# Patient Record
Sex: Female | Born: 1990 | Race: Black or African American | Hispanic: No | Marital: Single | Smoking: Current every day smoker
Health system: Southern US, Community
[De-identification: ages and names within clinical notes are randomized; demographics above are authoritative.]

## PROBLEM LIST (undated history)

## (undated) ENCOUNTER — Inpatient Hospital Stay (HOSPITAL_COMMUNITY): Payer: Self-pay

## (undated) DIAGNOSIS — D649 Anemia, unspecified: Secondary | ICD-10-CM

## (undated) DIAGNOSIS — Z7689 Persons encountering health services in other specified circumstances: Secondary | ICD-10-CM

## (undated) DIAGNOSIS — K219 Gastro-esophageal reflux disease without esophagitis: Secondary | ICD-10-CM

## (undated) DIAGNOSIS — F419 Anxiety disorder, unspecified: Secondary | ICD-10-CM

## (undated) DIAGNOSIS — Z5189 Encounter for other specified aftercare: Secondary | ICD-10-CM

## (undated) HISTORY — PX: EYE SURGERY: SHX253

## (undated) HISTORY — DX: Encounter for other specified aftercare: Z51.89

## (undated) HISTORY — PX: FINGER SURGERY: SHX640

## (undated) HISTORY — DX: Gastro-esophageal reflux disease without esophagitis: K21.9

---
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
## 2012-01-19 ENCOUNTER — Encounter (HOSPITAL_COMMUNITY): Payer: Self-pay

## 2012-01-19 ENCOUNTER — Emergency Department (HOSPITAL_COMMUNITY)
Admission: EM | Admit: 2012-01-19 | Discharge: 2012-01-19 | Disposition: A | Discharge status: Home / Self Care | Payer: Self-pay | Attending: Emergency Medicine | Admitting: Emergency Medicine

## 2012-01-19 DIAGNOSIS — J029 Acute pharyngitis, unspecified: Secondary | ICD-10-CM | POA: Insufficient documentation

## 2012-01-19 LAB — RAPID STREP SCREEN (MED CTR MEBANE ONLY): Streptococcus, Group A Screen (Direct): NEGATIVE

## 2012-01-19 NOTE — ED Notes (Signed)
Pt complainso f sore throat for 2 days, sts it may be her allergies.

## 2012-01-19 NOTE — ED Provider Notes (Signed)
History  This chart was scribed for Hilario Quarry, MD by Ladona Ridgel Day. This patient was seen in room TR02C/TR02C and the patient's care was started at 1428.   CSN: [id number]  Arrival date & time 01/19/12  1428   First MD Initiated Contact with Patient 01/19/12 1526      Chief Complaint  Patient presents with  . Sore Throat   Patient is a 21 y.o. female presenting with pharyngitis. The history is provided by the patient. No language interpreter was used.  Sore Throat This is a new problem. The current episode started 2 days ago. The problem occurs constantly. The problem has been gradually worsening. Pertinent negatives include no chest pain, no abdominal pain and no shortness of breath. Nothing aggravates the symptoms. Nothing relieves the symptoms. She has tried acetaminophen for the symptoms. The treatment provided no relief.   Katie Powell is a 21 y.o. female who presents to the Emergency Department complaining of constant gradually worsening sore throat since yesterday. She has been taking tylenol without any relief from her symptoms. She denies any fever, runny nose, or cough. She denies any sick contacts at home. She denies any medical history.   No past medical history on file.  No past surgical history on file.  No family history on file.  History  Substance Use Topics  . Smoking status: Never Smoker   . Smokeless tobacco: Not on file  . Alcohol Use: No    OB History    Grav Para Term Preterm Abortions TAB SAB Ect Mult Living                  Review of Systems  Constitutional: Negative for fever and chills.  HENT: Positive for sore throat. Negative for congestion and rhinorrhea.   Respiratory: Negative for shortness of breath.   Cardiovascular: Negative for chest pain.  Gastrointestinal: Negative for nausea, vomiting and abdominal pain.  Neurological: Negative for weakness.  All other systems reviewed and are negative.    Allergies  Review of patient's  allergies indicates no known allergies.  Home Medications   Current Outpatient Rx  Name Route Sig Dispense Refill  . ACETAMINOPHEN 500 MG PO TABS Oral Take 1,000 mg by mouth every 6 (six) hours as needed. For pain      Triage vitals: BP 113/66  Pulse 80  Temp 99.1 F (37.3 C) (Oral)  Resp 18  SpO2 100%  LMP 12/23/2011  Physical Exam  Nursing note and vitals reviewed. Constitutional: She is oriented to person, place, and time. She appears well-developed and well-nourished. No distress.  HENT:  Head: Normocephalic and atraumatic.       Mild pharyngeal erythema.   Eyes: EOM are normal.  Neck: Neck supple. No tracheal deviation present.  Cardiovascular: Normal rate.   Pulmonary/Chest: Effort normal. No respiratory distress.  Musculoskeletal: Normal range of motion.  Lymphadenopathy:    She has no cervical adenopathy.  Neurological: She is alert and oriented to person, place, and time.  Skin: Skin is warm and dry.  Psychiatric: She has a normal mood and affect. Her behavior is normal.    ED Course  Procedures (including critical care time) DIAGNOSTIC STUDIES: Oxygen Saturation is 100% on room air, normal by my interpretation.    COORDINATION OF CARE: At 330 PM Discussed treatment plan with patient which includes ibuprofen, proper hydration and return to the ED if her symptoms worsen or change. Patient agrees.    Labs Reviewed  RAPID STREP SCREEN  No results found. Results for orders placed during the hospital encounter of 01/19/12  RAPID STREP SCREEN      Component Value Range   Streptococcus, Group A Screen (Direct) NEGATIVE  NEGATIVE     1. Pharyngitis       MDM  I personally performed the services described in this documentation, which was scribed in my presence. The recorded information has been reviewed and considered.         Hilario Quarry, MD 01/19/12 [phone]

## 2012-09-07 ENCOUNTER — Encounter (HOSPITAL_COMMUNITY): Payer: Self-pay | Admitting: *Deleted

## 2012-09-07 ENCOUNTER — Emergency Department (HOSPITAL_COMMUNITY)
Admission: EM | Admit: 2012-09-07 | Discharge: 2012-09-08 | Disposition: A | Discharge status: Home / Self Care | Payer: Self-pay | Attending: Emergency Medicine | Admitting: Emergency Medicine

## 2012-09-07 DIAGNOSIS — R110 Nausea: Secondary | ICD-10-CM | POA: Insufficient documentation

## 2012-09-07 DIAGNOSIS — Z3202 Encounter for pregnancy test, result negative: Secondary | ICD-10-CM | POA: Insufficient documentation

## 2012-09-07 DIAGNOSIS — R109 Unspecified abdominal pain: Secondary | ICD-10-CM | POA: Insufficient documentation

## 2012-09-07 DIAGNOSIS — D649 Anemia, unspecified: Secondary | ICD-10-CM | POA: Insufficient documentation

## 2012-09-07 DIAGNOSIS — R197 Diarrhea, unspecified: Secondary | ICD-10-CM | POA: Insufficient documentation

## 2012-09-07 LAB — CBC WITH DIFFERENTIAL/PLATELET
Basophils Absolute: 0 10*3/uL (ref 0.0–0.1)
Eosinophils Relative: 4 % (ref 0–5)
Lymphocytes Relative: 44 % (ref 12–46)
Lymphs Abs: 2.1 10*3/uL (ref 0.7–4.0)
MCV: 60.4 fL — ABNORMAL LOW (ref 78.0–100.0)
Monocytes Relative: 7 % (ref 3–12)
Neutrophils Relative %: 44 % (ref 43–77)
Platelets: 245 10*3/uL (ref 150–400)
RBC: 4.12 MIL/uL (ref 3.87–5.11)
RDW: 19.5 % — ABNORMAL HIGH (ref 11.5–15.5)
WBC: 4.8 10*3/uL (ref 4.0–10.5)

## 2012-09-07 LAB — URINALYSIS, MICROSCOPIC ONLY
Bilirubin Urine: NEGATIVE
Glucose, UA: NEGATIVE mg/dL
Hgb urine dipstick: NEGATIVE
Protein, ur: NEGATIVE mg/dL
Urobilinogen, UA: 0.2 mg/dL (ref 0.0–1.0)

## 2012-09-07 LAB — COMPREHENSIVE METABOLIC PANEL
ALT: 8 U/L (ref 0–35)
AST: 20 U/L (ref 0–37)
Albumin: 3.8 g/dL (ref 3.5–5.2)
Alkaline Phosphatase: 56 U/L (ref 39–117)
CO2: 24 mEq/L (ref 19–32)
Chloride: 104 mEq/L (ref 96–112)
Creatinine, Ser: 0.86 mg/dL (ref 0.50–1.10)
GFR calc non Af Amer: >90 mL/min (ref >90)
Potassium: 3.3 mEq/L — ABNORMAL LOW (ref 3.5–5.1)
Sodium: 137 mEq/L (ref 135–145)
Total Bilirubin: 0.6 mg/dL (ref 0.3–1.2)

## 2012-09-07 LAB — POCT PREGNANCY, URINE: Preg Test, Ur: NEGATIVE

## 2012-09-07 MED ORDER — ONDANSETRON 4 MG PO TBDP
8.0000 mg | ORAL_TABLET | Freq: Once | ORAL | Status: AC
  Administered 2012-09-08: 8 mg via ORAL
  Filled 2012-09-07: qty 2

## 2012-09-07 NOTE — ED Notes (Signed)
Pt c/o nausea & diarrhea x 1 week, denies vomiting. Also c/o lower abdominal cramping

## 2012-09-07 NOTE — ED Notes (Signed)
MD at bedside. Otter 

## 2012-09-08 MED ORDER — ONDANSETRON 8 MG PO TBDP: 8.0000 mg | ORAL_TABLET | Freq: Three times a day (TID) | ORAL | Status: DC | PRN

## 2012-09-08 MED ORDER — FERROUS SULFATE 325 (65 FE) MG PO TABS: 325.0000 mg | ORAL_TABLET | Freq: Every day | ORAL | Status: DC

## 2012-09-08 NOTE — ED Notes (Signed)
Pt states he cannot poop to obtain stool sample.

## 2012-09-08 NOTE — ED Provider Notes (Addendum)
History     CSN: [id number]  Arrival date & time 09/07/12  1919   First MD Initiated Contact with Patient 09/07/12 2340      Chief Complaint  Patient presents with  . Diarrhea    (Consider location/radiation/quality/duration/timing/severity/associated sxs/prior treatment) HPI 22 yo female presents with complaint of nausea, mild mid abd pain, and loose stools x 1 week.  No fevers, no travel, no unusual foods, no sick contacts.  No vomiting.  Pt noted to be anemic.  No prior h/o same.  She denies cp, sob, DOE, fatigue.  She reports heavy periods.     History reviewed. No pertinent past medical history.  Past Surgical History  Procedure Laterality Date  . Finger surgery      History reviewed. No pertinent family history.  History  Substance Use Topics  . Smoking status: Never Smoker   . Smokeless tobacco: Not on file  . Alcohol Use: No    OB History   Grav Para Term Preterm Abortions TAB SAB Ect Mult Living                  Review of Systems  All other systems reviewed and are negative.    Allergies  Review of patient's allergies indicates no known allergies.  Home Medications   Current Outpatient Rx  Name  Route  Sig  Dispense  Refill  . OVER THE COUNTER MEDICATION   Oral   Take 2 tablets by mouth every 6 (six) hours as needed (pain).         . ferrous sulfate 325 (65 FE) MG tablet   Oral   Take 1 tablet (325 mg total) by mouth daily.   30 tablet   0   . ondansetron (ZOFRAN-ODT) 8 MG disintegrating tablet   Oral   Take 1 tablet (8 mg total) by mouth every 8 (eight) hours as needed for nausea.   20 tablet   0     BP 116/70  Pulse 51  Temp(Src) 97.6 F (36.4 C) (Oral)  Resp 17  SpO2 100%  LMP 08/01/2012  Physical Exam  Nursing note and vitals reviewed. Constitutional: She is oriented to person, place, and time. She appears well-developed and well-nourished.  HENT:  Head: Normocephalic and atraumatic.  Nose: Nose normal.  Mouth/Throat:  Oropharynx is clear and moist.  Eyes: Conjunctivae and EOM are normal. Pupils are equal, round, and reactive to light.  Neck: Normal range of motion. Neck supple. No JVD present. No tracheal deviation present. No thyromegaly present.  Cardiovascular: Normal rate, regular rhythm, normal heart sounds and intact distal pulses.  Exam reveals no gallop and no friction rub.   No murmur heard. Pulmonary/Chest: Effort normal and breath sounds normal. No stridor. No respiratory distress. She has no wheezes. She has no rales. She exhibits no tenderness.  Abdominal: Soft. She exhibits no distension and no mass. There is tenderness (mild diffuse mid abd pain). There is no rebound and no guarding.  Hyperactive bowel sounds  Musculoskeletal: Normal range of motion. She exhibits no edema and no tenderness.  Lymphadenopathy:    She has no cervical adenopathy.  Neurological: She is alert and oriented to person, place, and time. She exhibits normal muscle tone. Coordination normal.  Skin: Skin is dry. No rash noted. No erythema. No pallor.  Psychiatric: She has a normal mood and affect. Her behavior is normal. Judgment and thought content normal.    ED Course  Procedures (including critical care time)  Labs Reviewed  CBC WITH DIFFERENTIAL - Abnormal; Notable for the following:    Hemoglobin 7.4 (*)    HCT 24.9 (*)    MCV 60.4 (*)    MCH 18.0 (*)    MCHC 29.7 (*)    RDW 19.5 (*)    All other components within normal limits  COMPREHENSIVE METABOLIC PANEL - Abnormal; Notable for the following:    Potassium 3.3 (*)    All other components within normal limits  URINALYSIS, MICROSCOPIC ONLY - Abnormal; Notable for the following:    APPearance HAZY (*)    Squamous Epithelial / LPF FEW (*)    All other components within normal limits  POCT PREGNANCY, URINE   No results found.   1. Diarrhea   2. Anemia       MDM  22 yo female with diarrhea, anemia.  Pt is not symptomatic with her hgb of 7.4,  suspect chronic state.  Pt referred to GYN for evaluation of menorrhagia.  Do not feel she needs transfusion. Will start on iron. Pt unable to give stool sample here, outpatient orders placed.  Pt given outpatient referral for pcm.  Return precautions given.       Olivia Mackie, MD 09/08/12 1191  Olivia Mackie, MD 09/08/12 [phone]

## 2012-09-12 ENCOUNTER — Encounter (HOSPITAL_COMMUNITY): Payer: Self-pay

## 2012-09-12 ENCOUNTER — Emergency Department (HOSPITAL_COMMUNITY): Payer: No Typology Code available for payment source

## 2012-09-12 ENCOUNTER — Emergency Department (HOSPITAL_COMMUNITY)
Admission: EM | Admit: 2012-09-12 | Discharge: 2012-09-12 | Disposition: A | Discharge status: Home / Self Care | Payer: No Typology Code available for payment source | Attending: Emergency Medicine | Admitting: Emergency Medicine

## 2012-09-12 DIAGNOSIS — Z79899 Other long term (current) drug therapy: Secondary | ICD-10-CM | POA: Insufficient documentation

## 2012-09-12 DIAGNOSIS — Z3202 Encounter for pregnancy test, result negative: Secondary | ICD-10-CM | POA: Insufficient documentation

## 2012-09-12 DIAGNOSIS — Y93I9 Activity, other involving external motion: Secondary | ICD-10-CM | POA: Insufficient documentation

## 2012-09-12 DIAGNOSIS — Z862 Personal history of diseases of the blood and blood-forming organs and certain disorders involving the immune mechanism: Secondary | ICD-10-CM | POA: Insufficient documentation

## 2012-09-12 DIAGNOSIS — S40012A Contusion of left shoulder, initial encounter: Secondary | ICD-10-CM

## 2012-09-12 DIAGNOSIS — S40019A Contusion of unspecified shoulder, initial encounter: Secondary | ICD-10-CM | POA: Insufficient documentation

## 2012-09-12 DIAGNOSIS — Y92410 Unspecified street and highway as the place of occurrence of the external cause: Secondary | ICD-10-CM | POA: Insufficient documentation

## 2012-09-12 HISTORY — DX: Anemia, unspecified: D64.9

## 2012-09-12 LAB — URINALYSIS, ROUTINE W REFLEX MICROSCOPIC
Bilirubin Urine: NEGATIVE
Hgb urine dipstick: NEGATIVE
Nitrite: NEGATIVE
Protein, ur: NEGATIVE mg/dL
Urobilinogen, UA: 1 mg/dL (ref 0.0–1.0)

## 2012-09-12 MED ORDER — OXYCODONE-ACETAMINOPHEN 5-325 MG PO TABS: 2.0000 | ORAL_TABLET | ORAL | Status: DC | PRN

## 2012-09-12 MED ORDER — OXYCODONE-ACETAMINOPHEN 5-325 MG PO TABS
2.0000 | ORAL_TABLET | Freq: Once | ORAL | Status: AC
  Administered 2012-09-12: 2 via ORAL
  Filled 2012-09-12: qty 2

## 2012-09-12 MED ORDER — IBUPROFEN 800 MG PO TABS: 800.0000 mg | ORAL_TABLET | Freq: Three times a day (TID) | ORAL | Status: DC

## 2012-09-12 NOTE — ED Notes (Signed)
Pt. oob to the bathroom gait steady.  Pt. Refused to use Bedpan.  Pt. Was unable to given urine specimen at this time.
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
## 2013-12-17 ENCOUNTER — Emergency Department (HOSPITAL_COMMUNITY)
Admission: EM | Admit: 2013-12-17 | Discharge: 2013-12-18 | Disposition: A | Discharge status: Home / Self Care | Payer: No Typology Code available for payment source | Attending: Emergency Medicine | Admitting: Emergency Medicine

## 2013-12-17 ENCOUNTER — Encounter (HOSPITAL_COMMUNITY): Payer: Self-pay | Admitting: Emergency Medicine

## 2013-12-17 DIAGNOSIS — R05 Cough: Secondary | ICD-10-CM

## 2013-12-17 DIAGNOSIS — J029 Acute pharyngitis, unspecified: Secondary | ICD-10-CM | POA: Insufficient documentation

## 2013-12-17 DIAGNOSIS — R059 Cough, unspecified: Secondary | ICD-10-CM | POA: Insufficient documentation

## 2013-12-17 DIAGNOSIS — H9209 Otalgia, unspecified ear: Secondary | ICD-10-CM

## 2013-12-17 DIAGNOSIS — Z862 Personal history of diseases of the blood and blood-forming organs and certain disorders involving the immune mechanism: Secondary | ICD-10-CM | POA: Insufficient documentation

## 2013-12-17 DIAGNOSIS — Z791 Long term (current) use of non-steroidal anti-inflammatories (NSAID): Secondary | ICD-10-CM | POA: Insufficient documentation

## 2013-12-17 DIAGNOSIS — Z79899 Other long term (current) drug therapy: Secondary | ICD-10-CM | POA: Insufficient documentation

## 2013-12-17 LAB — RAPID STREP SCREEN (MED CTR MEBANE ONLY): STREPTOCOCCUS, GROUP A SCREEN (DIRECT): NEGATIVE

## 2013-12-17 MED ORDER — AMOXICILLIN 500 MG PO CAPS: 500.0000 mg | ORAL_CAPSULE | Freq: Three times a day (TID) | ORAL | Status: DC

## 2013-12-17 NOTE — Discharge Instructions (Signed)
Upper Respiratory Infection, Adult An upper respiratory infection (URI) is also known as the common cold. It is often caused by a type of germ (virus). Colds are easily spread (contagious). You can pass it to others by kissing, coughing, sneezing, or drinking out of the same glass. Usually, you get better in 1 or 2 weeks.  HOME CARE   Only take medicine as told by your doctor.  Use a warm mist humidifier or breathe in steam from a hot shower.  Drink enough water and fluids to keep your pee (urine) clear or pale yellow.  Get plenty of rest.  Return to work when your temperature is back to normal or as told by your doctor. You may use a face mask and wash your hands to stop your cold from spreading. GET HELP RIGHT AWAY IF:   After the first few days, you feel you are getting worse.  You have questions about your medicine.  You have chills, shortness of breath, or brown or red spit (mucus).  You have yellow or brown snot (nasal discharge) or pain in the face, especially when you bend forward.  You have a fever, puffy (swollen) neck, pain when you swallow, or white spots in the back of your throat.  You have a bad headache, ear pain, sinus pain, or chest pain.  You have a high-pitched whistling sound when you breathe in and out (wheezing).  You have a lasting cough or cough up blood.  You have sore muscles or a stiff neck. MAKE SURE YOU:   Understand these instructions.  Will watch your condition.  Will get help right away if you are not doing well or get worse. Document Released: 10/14/2007 Document Revised: 07/20/2011 Document Reviewed: 08/02/2013 Exit[employer] Patient Information 2015 [employer]. This information is not intended to replace advice given to you by your health care provider. Make sure you discuss any questions you have with your health care provider.  

## 2013-12-17 NOTE — ED Provider Notes (Signed)
CSN: [id number]     Arrival date & time 12/17/13  2311 History  This chart was scribed for non-physician practitioner working with Ernestina Patches, MD by Mercy Moore, ED Scribe. This patient was seen in room TR05C/TR05C and the patient's care was started at 11:50 PM.   Chief Complaint  Patient presents with  . Sore Throat     The history is provided by the patient. No language interpreter was used.   HPI Comments: Katie Powell is a 23 y.o. female who presents to the Emergency Department with chief complaint of sore throat with productive cough for two days. She denies any fevers, chills, nausea, vomiting. Patient reports a small amount of streaky blood in her sputum today at work. Attempted treatment with Nyquil, with no relief. Denies any other health problems. Patient denies recent sick contacts.   Past Medical History  Diagnosis Date  . Anemia    Past Surgical History  Procedure Laterality Date  . Finger surgery     No family history on file. History  Substance Use Topics  . Smoking status: Never Smoker   . Smokeless tobacco: Not on file  . Alcohol Use: No   OB History   Grav Para Term Preterm Abortions TAB SAB Ect Mult Living                 Review of Systems  Constitutional: Negative for fever and chills.  HENT: Positive for sore throat. Negative for trouble swallowing.   Respiratory: Positive for cough.   Cardiovascular: Negative for chest pain.  Gastrointestinal: Negative for nausea and vomiting.      Allergies  Review of patient's allergies indicates no known allergies.  Home Medications   Prior to Admission medications   Medication Sig Start Date End Date Taking? Authorizing Provider  ibuprofen (ADVIL,MOTRIN) 800 MG tablet Take 1 tablet (800 mg total) by mouth 3 (three) times daily. 09/12/12   Wandra Arthurs, MD  OVER THE COUNTER MEDICATION Take 1,000 mg by mouth every 4 (four) hours as needed (pain). Pt not sure which over the counter pain medicine but it  was 500 mg per tablet    Historical Provider, MD  oxyCODONE-acetaminophen (PERCOCET) 5-325 MG per tablet Take 2 tablets by mouth every 4 (four) hours as needed for pain. 09/12/12   Wandra Arthurs, MD  Polyethyl Glycol-Propyl Glycol (SYSTANE OP) Place 1 drop into both eyes 2 (two) times daily.    Historical Provider, MD   Triage Vitals: BP 112/78  Pulse 95  Temp(Src) 99 F (37.2 C) (Oral)  Resp 14  Ht 5\' 6"  (1.676 m)  Wt 166 lb (75.297 kg)  BMI 26.81 kg/m2  SpO2 100%  LMP 11/28/2013  Physical Exam  Nursing note and vitals reviewed. Constitutional: She is oriented to person, place, and time. She appears well-developed and well-nourished.  HENT:  Head: Normocephalic and atraumatic.  Right TM mily erythematous. Oropharynx  is clear. No exudate. No paratonsillar abscess. Uvula is midine.   Eyes: EOM are normal.  Neck: Neck supple.  Cardiovascular: Normal rate, regular rhythm, normal heart sounds and intact distal pulses.  Exam reveals no gallop and no friction rub.   No murmur heard. Pulmonary/Chest: Effort normal and breath sounds normal. No respiratory distress. She has no wheezes. She has no rales. She exhibits no tenderness.  Lungs clear to ascultation bilaterally.   Musculoskeletal: Normal range of motion.  Neurological: She is alert and oriented to person, place, and time.  Skin: Skin is warm  and dry.  Psychiatric: She has a normal mood and affect. Her behavior is normal.    ED Course  Procedures (including critical care time)  COORDINATION OF CARE: 11:55 PM- Will prescribe Amoxicillin. Patient advised symptomatically with OTC medications.  Discussed treatment plan with patient at bedside and patient agreed to plan.   Results for orders placed during the hospital encounter of 12/17/13  RAPID STREP SCREEN      Result Value Ref Range   Streptococcus, Group A Screen (Direct) NEGATIVE  NEGATIVE   No results found.   Imaging Review No results found.   EKG  Interpretation None      MDM   Final diagnoses:  Earache, unspecified laterality  Sore throat  Cough   Patient with probable viral URI, however she is having some erythema around her eardrum. Will discharge with amoxicillin. Recommend primary care followup. Return precautions given.  I personally performed the services described in this documentation, which was scribed in my presence. The recorded information has been reviewed and is accurate.    Montine Circle, PA-C 12/18/13 [phone]

## 2013-12-17 NOTE — ED Notes (Signed)
Pt. reports sore throat with productive cough onset 2 days ago , denies fever or chills.

## 2013-12-18 NOTE — ED Provider Notes (Signed)
Medical screening examination/treatment/procedure(s) were performed by non-physician practitioner and as supervising physician I was immediately available for consultation/collaboration.   EKG Interpretation None        Julianne Rice, MD 12/18/13 [phone]

## 2013-12-19 LAB — CULTURE, GROUP A STREP

## 2013-12-23 ENCOUNTER — Emergency Department (HOSPITAL_COMMUNITY)
Admission: EM | Admit: 2013-12-23 | Discharge: 2013-12-23 | Disposition: A | Discharge status: Home / Self Care | Payer: No Typology Code available for payment source | Attending: Emergency Medicine | Admitting: Emergency Medicine

## 2013-12-23 ENCOUNTER — Encounter (HOSPITAL_COMMUNITY): Payer: Self-pay | Admitting: Emergency Medicine

## 2013-12-23 DIAGNOSIS — Z862 Personal history of diseases of the blood and blood-forming organs and certain disorders involving the immune mechanism: Secondary | ICD-10-CM | POA: Insufficient documentation

## 2013-12-23 DIAGNOSIS — Z792 Long term (current) use of antibiotics: Secondary | ICD-10-CM | POA: Insufficient documentation

## 2013-12-23 DIAGNOSIS — J0110 Acute frontal sinusitis, unspecified: Secondary | ICD-10-CM | POA: Insufficient documentation

## 2013-12-23 DIAGNOSIS — R51 Headache: Secondary | ICD-10-CM | POA: Insufficient documentation

## 2013-12-23 DIAGNOSIS — Z79899 Other long term (current) drug therapy: Secondary | ICD-10-CM | POA: Insufficient documentation

## 2013-12-23 MED ORDER — PSEUDOEPHEDRINE HCL ER 120 MG PO TB12: 120.0000 mg | ORAL_TABLET | Freq: Two times a day (BID) | ORAL | Status: DC

## 2013-12-23 MED ORDER — PSEUDOEPHEDRINE HCL ER 120 MG PO TB12
120.0000 mg | ORAL_TABLET | Freq: Two times a day (BID) | ORAL | Status: DC
  Filled 2013-12-23 (×2): qty 1

## 2013-12-23 NOTE — ED Notes (Signed)
Patient left w/o receiving her discharge papers or medications.

## 2013-12-23 NOTE — ED Notes (Signed)
Into room to give pt meds and discharge home, pt not in room or in bathroom.  Will check again in case pt went to [place].

## 2013-12-23 NOTE — ED Provider Notes (Signed)
CSN: [id number]     Arrival date & time 12/23/13  0206 History   First MD Initiated Contact with Patient 12/23/13 0256     Chief Complaint  Patient presents with  . Headache     (Consider location/radiation/quality/duration/timing/severity/associated sxs/prior Treatment) HPI Comments: Third visit this patient had for her URI, symptoms, and sore throat.  On the last visit she was given a prescription for amoxicillin, which she did not fill.  She decided to take over-the-counter NyQuil instead, and is still having symptoms.  Denies any fever, but states increased pressure in the frontal sinus area  Patient is a 23 y.o. female presenting with headaches. The history is provided by the patient.  Headache Pain location:  Frontal Quality:  Dull Radiates to:  Does not radiate Severity currently:  5/10 Severity at highest:  9/10 Onset quality:  Gradual Timing:  Constant Progression:  Worsening Chronicity:  New Similar to prior headaches: no   Relieved by:  Nothing Exacerbated by: leaning forward. Ineffective treatments:  None tried Associated symptoms: congestion and sinus pressure   Associated symptoms: no cough and no fever     Past Medical History  Diagnosis Date  . Anemia    Past Surgical History  Procedure Laterality Date  . Finger surgery     No family history on file. History  Substance Use Topics  . Smoking status: Never Smoker   . Smokeless tobacco: Not on file  . Alcohol Use: No   OB History   Grav Para Term Preterm Abortions TAB SAB Ect Mult Living                 Review of Systems  Constitutional: Negative for fever and chills.  HENT: Positive for congestion and sinus pressure.   Respiratory: Negative for cough.   Neurological: Positive for headaches.  All other systems reviewed and are negative.     Allergies  Review of patient's allergies indicates no known allergies.  Home Medications   Prior to Admission medications   Medication Sig Start Date  End Date Taking? Authorizing Provider  amoxicillin (AMOXIL) 500 MG capsule Take 1 capsule (500 mg total) by mouth 3 (three) times daily. 12/17/13   Montine Circle, PA-C  ibuprofen (ADVIL,MOTRIN) 800 MG tablet Take 1 tablet (800 mg total) by mouth 3 (three) times daily. 09/12/12   Wandra Arthurs, MD  OVER THE COUNTER MEDICATION Take 1,000 mg by mouth every 4 (four) hours as needed (pain). Pt not sure which over the counter pain medicine but it was 500 mg per tablet    Historical Provider, MD  oxyCODONE-acetaminophen (PERCOCET) 5-325 MG per tablet Take 2 tablets by mouth every 4 (four) hours as needed for pain. 09/12/12   Wandra Arthurs, MD  Polyethyl Glycol-Propyl Glycol (SYSTANE OP) Place 1 drop into both eyes 2 (two) times daily.    Historical Provider, MD  pseudoephedrine (SUDAFED) 120 MG 12 hr tablet Take 1 tablet (120 mg total) by mouth 2 (two) times daily. 12/23/13   Garald Balding, NP   BP 118/70  Pulse 68  Temp(Src) 97.9 F (36.6 C) (Oral)  Resp 18  Wt 166 lb 1 oz (75.325 kg)  SpO2 94%  LMP 11/28/2013 Physical Exam  Nursing note and vitals reviewed. Constitutional: She is oriented to person, place, and time. She appears well-developed and well-nourished.  HENT:  Head: Normocephalic.  Nose: Right sinus exhibits frontal sinus tenderness. Right sinus exhibits no maxillary sinus tenderness. Left sinus exhibits frontal sinus tenderness. Left  sinus exhibits no maxillary sinus tenderness.  Eyes: Pupils are equal, round, and reactive to light.  Neck: Normal range of motion.  Cardiovascular: Normal rate and regular rhythm.   Pulmonary/Chest: Effort normal.  Musculoskeletal: Normal range of motion.  Neurological: She is alert and oriented to person, place, and time.  Skin: Skin is warm. No rash noted.    ED Course  Procedures (including critical care time) Labs Review Labs Reviewed - No data to display  Imaging Review No results found.   EKG Interpretation None      MDM   Final  diagnoses:  Acute frontal sinusitis, recurrence not specified    And encouraged the patient to fill her prescription for amoxicillin, and take as directed.  Have also given him a prescription for Sudafed to help with the congestion     Garald Balding, NP 12/23/13 [phone]

## 2013-12-23 NOTE — Discharge Instructions (Signed)
Sinusitis Sinusitis is redness, soreness, and puffiness (inflammation) of the air pockets in the bones of your face (sinuses). The redness, soreness, and puffiness can cause air and mucus to get trapped in your sinuses. This can allow germs to grow and cause an infection.  HOME CARE   Drink enough fluids to keep your pee (urine) clear or pale yellow.  Use a humidifier in your home.  Run a hot shower to create steam in the bathroom. Sit in the bathroom with the door closed. Breathe in the steam 3-4 times a day.  Put a warm, moist washcloth on your face 3-4 times a day, or as told by your doctor.  Use salt water sprays (saline sprays) to wet the thick fluid in your nose. This can help the sinuses drain.  Only take medicine as told by your doctor. GET HELP RIGHT AWAY IF:   Your pain gets worse.  You have very bad headaches.  You are sick to your stomach (nauseous).  You throw up (vomit).  You are very sleepy (drowsy) all the time.  Your face is puffy (swollen).  Your vision changes.  You have a stiff neck.  You have trouble breathing. MAKE SURE YOU:   Understand these instructions.  Will watch your condition.  Will get help right away if you are not doing well or get worse. Document Released: 10/14/2007 Document Revised: 01/20/2012 Document Reviewed: 12/01/2011 [facility] Patient Information 2015 [place], [place]. This information is not intended to replace advice given to you by your health care provider. Make sure you discuss any questions you have with your health care provider. Fill your prescription for Amoxicillin and take until completed

## 2013-12-23 NOTE — ED Notes (Signed)
The pt has a cold and she has had a headache all day.  No nor v

## 2013-12-23 NOTE — ED Provider Notes (Signed)
Medical screening examination/treatment/procedure(s) were conducted as a shared visit with non-physician practitioner(s) and myself.  I personally evaluated the patient during the encounter.     Elyn Peers, MD 12/23/13 [phone]

## 2014-04-16 ENCOUNTER — Emergency Department (HOSPITAL_COMMUNITY)
Admission: EM | Admit: 2014-04-16 | Discharge: 2014-04-16 | Disposition: A | Discharge status: Home / Self Care | Payer: No Typology Code available for payment source | Attending: Emergency Medicine | Admitting: Emergency Medicine

## 2014-04-16 ENCOUNTER — Encounter (HOSPITAL_COMMUNITY): Payer: Self-pay | Admitting: Emergency Medicine

## 2014-04-16 DIAGNOSIS — J029 Acute pharyngitis, unspecified: Secondary | ICD-10-CM | POA: Insufficient documentation

## 2014-04-16 DIAGNOSIS — Z791 Long term (current) use of non-steroidal anti-inflammatories (NSAID): Secondary | ICD-10-CM | POA: Insufficient documentation

## 2014-04-16 DIAGNOSIS — Z79899 Other long term (current) drug therapy: Secondary | ICD-10-CM | POA: Insufficient documentation

## 2014-04-16 DIAGNOSIS — Z862 Personal history of diseases of the blood and blood-forming organs and certain disorders involving the immune mechanism: Secondary | ICD-10-CM | POA: Insufficient documentation

## 2014-04-16 MED ORDER — PREDNISONE 20 MG PO TABS
60.0000 mg | ORAL_TABLET | Freq: Once | ORAL | Status: AC
  Administered 2014-04-16: 60 mg via ORAL
  Filled 2014-04-16: qty 3

## 2014-04-16 MED ORDER — AMOXICILLIN 500 MG PO CAPS: 500.0000 mg | ORAL_CAPSULE | Freq: Three times a day (TID) | ORAL | Status: DC

## 2014-04-16 MED ORDER — PREDNISONE 10 MG PO TABS: 40.0000 mg | ORAL_TABLET | Freq: Every day | ORAL | Status: DC

## 2014-04-16 NOTE — Discharge Instructions (Signed)

## 2014-04-16 NOTE — ED Provider Notes (Signed)
CSN: [id number]     Arrival date & time 04/16/14  1306 History   First MD Initiated Contact with Patient 04/16/14 1511     Chief Complaint  Patient presents with  . Sore Throat     (Consider location/radiation/quality/duration/timing/severity/associated sxs/prior Treatment) HPI  Patient to the ER with complaints of throat tightness, pain and SOB. She had a single episode of SOB when she woke up in the middle of the night with a choking sensation and felt as though she couldn't get her breath back. Once she calmed down her breathing returned normal and she no longer felt short of breath. So far today she has not been feeling short of breath and she currently does not feel short of breath right now. She describes to her throat tightness and pain as a scratchy feeling in the back of throat and pain with swallowing. Her pain radiates up into her left ear. He has not had any chest pain, fevers, nausea, vomiting, diarrhea, neck pain, headaches, abdominal pain, dysuria, vaginal bleeding. She tells me that she overall feels well.  Past Medical History  Diagnosis Date  . Anemia    Past Surgical History  Procedure Laterality Date  . Finger surgery     History reviewed. No pertinent family history. History  Substance Use Topics  . Smoking status: Never Smoker   . Smokeless tobacco: Not on file  . Alcohol Use: No   OB History    No data available     Review of Systems  10 Systems reviewed and are negative for acute change except as noted in the HPI.     Allergies  Review of patient's allergies indicates no known allergies.  Home Medications   Prior to Admission medications   Medication Sig Start Date End Date Taking? Authorizing Provider  amoxicillin (AMOXIL) 500 MG capsule Take 1 capsule (500 mg total) by mouth 3 (three) times daily. 04/16/14   Asmaa Tirpak Marilu Favre, PA-C  ibuprofen (ADVIL,MOTRIN) 800 MG tablet Take 1 tablet (800 mg total) by mouth 3 (three) times daily. Patient not  taking: Reported on 04/16/2014 09/12/12   Wandra Arthurs, MD  OVER THE COUNTER MEDICATION Take 1,000 mg by mouth every 4 (four) hours as needed (pain). Pt not sure which over the counter pain medicine but it was 500 mg per tablet    Historical Provider, MD  oxyCODONE-acetaminophen (PERCOCET) 5-325 MG per tablet Take 2 tablets by mouth every 4 (four) hours as needed for pain. 09/12/12   Wandra Arthurs, MD  predniSONE (DELTASONE) 10 MG tablet Take 4 tablets (40 mg total) by mouth daily. 04/17/14   Mariela Rex Marilu Favre, PA-C  pseudoephedrine (SUDAFED) 120 MG 12 hr tablet Take 1 tablet (120 mg total) by mouth 2 (two) times daily. Patient not taking: Reported on 04/16/2014 12/23/13   Garald Balding, NP   BP 116/69 mmHg  Pulse 74  Temp(Src) 98.3 F (36.8 C) (Oral)  Resp 16  SpO2 100% Physical Exam  Constitutional: She is oriented to person, place, and time. She appears well-developed and well-nourished. No distress.  HENT:  Head: Normocephalic and atraumatic.  Right Ear: Tympanic membrane, external ear and ear canal normal.  Left Ear: Tympanic membrane, external ear and ear canal normal.  Nose: Nose normal. No rhinorrhea. Right sinus exhibits no maxillary sinus tenderness and no frontal sinus tenderness. Left sinus exhibits no maxillary sinus tenderness and no frontal sinus tenderness.  Mouth/Throat: Uvula is midline and mucous membranes are normal. No trismus in  the jaw. Normal dentition. No dental abscesses or uvula swelling. Posterior oropharyngeal edema present. No oropharyngeal exudate, posterior oropharyngeal erythema or tonsillar abscesses.  No submental edema, tongue not elevated, no trismus. No impending airway obstruction; Pt able to speak full sentences, swallow intact, no drooling, stridor, or tonsillar/uvula displacement. No palatal petechia  Eyes: Conjunctivae are normal.  Neck: Trachea normal, normal range of motion and full passive range of motion without pain. Neck supple. No rigidity. Normal range of  motion present. No Brudzinski's sign noted.  Flexion and extension of neck without pain or difficulty. Able to breath without difficulty in extension.  Cardiovascular: Normal rate and regular rhythm.   Pulmonary/Chest: Effort normal and breath sounds normal. No stridor. No respiratory distress. She has no wheezes.  Abdominal: Soft. There is no tenderness.  No obvious evidence of splenomegaly. Non ttp.   Musculoskeletal: Normal range of motion.  Lymphadenopathy:       Head (right side): No preauricular and no posterior auricular adenopathy present.       Head (left side): No preauricular and no posterior auricular adenopathy present.    She has cervical adenopathy.  Neurological: She is alert and oriented to person, place, and time.  Skin: Skin is warm and dry. No rash noted. She is not diaphoretic.  Psychiatric: She has a normal mood and affect.  Nursing note and vitals reviewed.   ED Course  Procedures (including critical care time) Labs Review Labs Reviewed - No data to display  Imaging Review No results found.   EKG Interpretation None      MDM   Final diagnoses:  Pharyngitis    The patient has been instructed to take antibiotic and prednisone in completion. Continue to stay well-hydrated. Gargle warm salt water and spit it out. Continued to alternate between Tylenol and ibuprofen for pain. May consider over-the-counter Benadryl for additional relief. Followup with your primary care doctor in 5-7 days for recheck of ongoing symptoms that return to emergency department for emergent changing or worsening of symptoms.  23 y.o.Ayden G Doten's evaluation in the Emergency Department is complete. It has been determined that no acute conditions requiring further emergency intervention are present at this time. The patient/guardian have been advised of the diagnosis and plan. We have discussed signs and symptoms that warrant return to the ED, such as changes or worsening in  symptoms.  Vital signs are stable at discharge. Filed Vitals:   04/16/14 1334  BP: 116/69  Pulse: 74  Temp: 98.3 F (36.8 C)  Resp: 16    Patient/guardian has voiced understanding and agreed to follow-up with the PCP or specialist.     Linus Mako, PA-C 04/16/14 [place], MD 04/16/14 1606

## 2014-04-16 NOTE — ED Notes (Signed)
Pt c/o feeling "lump" in left throat with pain radiating to ear, some facial numbness noted when pt awoke today. Pt c/o SOB when laying down, denies difficulty breathing while upright.

## 2014-07-19 ENCOUNTER — Emergency Department (HOSPITAL_COMMUNITY)
Admission: EM | Admit: 2014-07-19 | Discharge: 2014-07-19 | Disposition: A | Discharge status: Home / Self Care | Payer: Medicaid Other | Attending: Emergency Medicine | Admitting: Emergency Medicine

## 2014-07-19 ENCOUNTER — Encounter (HOSPITAL_COMMUNITY): Payer: Self-pay | Admitting: Emergency Medicine

## 2014-07-19 DIAGNOSIS — Y9389 Activity, other specified: Secondary | ICD-10-CM | POA: Insufficient documentation

## 2014-07-19 DIAGNOSIS — Z792 Long term (current) use of antibiotics: Secondary | ICD-10-CM | POA: Insufficient documentation

## 2014-07-19 DIAGNOSIS — D649 Anemia, unspecified: Secondary | ICD-10-CM | POA: Insufficient documentation

## 2014-07-19 DIAGNOSIS — Z3202 Encounter for pregnancy test, result negative: Secondary | ICD-10-CM | POA: Insufficient documentation

## 2014-07-19 DIAGNOSIS — Y998 Other external cause status: Secondary | ICD-10-CM | POA: Insufficient documentation

## 2014-07-19 DIAGNOSIS — R55 Syncope and collapse: Secondary | ICD-10-CM

## 2014-07-19 DIAGNOSIS — W1839XA Other fall on same level, initial encounter: Secondary | ICD-10-CM | POA: Insufficient documentation

## 2014-07-19 DIAGNOSIS — Z79899 Other long term (current) drug therapy: Secondary | ICD-10-CM | POA: Insufficient documentation

## 2014-07-19 DIAGNOSIS — Z7952 Long term (current) use of systemic steroids: Secondary | ICD-10-CM | POA: Insufficient documentation

## 2014-07-19 DIAGNOSIS — S0990XA Unspecified injury of head, initial encounter: Secondary | ICD-10-CM | POA: Insufficient documentation

## 2014-07-19 DIAGNOSIS — Y9289 Other specified places as the place of occurrence of the external cause: Secondary | ICD-10-CM | POA: Insufficient documentation

## 2014-07-19 LAB — I-STAT CHEM 8, ED
BUN: 13 mg/dL (ref 6–23)
CHLORIDE: 107 mmol/L (ref 96–112)
Calcium, Ion: 1.2 mmol/L (ref 1.12–1.23)
Creatinine, Ser: 0.8 mg/dL (ref 0.50–1.10)
Glucose, Bld: 110 mg/dL — ABNORMAL HIGH (ref 70–99)
HEMATOCRIT: 24 % — AB (ref 36.0–46.0)
Hemoglobin: 8.2 g/dL — ABNORMAL LOW (ref 12.0–15.0)
Potassium: 3.4 mmol/L — ABNORMAL LOW (ref 3.5–5.1)
SODIUM: 142 mmol/L (ref 135–145)
TCO2: 19 mmol/L (ref 0–100)

## 2014-07-19 LAB — POC URINE PREG, ED: Preg Test, Ur: NEGATIVE

## 2014-07-19 MED ORDER — KETOROLAC TROMETHAMINE 30 MG/ML IJ SOLN: 30.0000 mg | Freq: Once | INTRAMUSCULAR | Status: DC

## 2014-07-19 MED ORDER — ONDANSETRON HCL 4 MG/2ML IJ SOLN: 4.0000 mg | Freq: Once | INTRAMUSCULAR | Status: DC

## 2014-07-19 MED ORDER — ONDANSETRON 4 MG PO TBDP: 4.0000 mg | ORAL_TABLET | Freq: Three times a day (TID) | ORAL | Status: DC | PRN

## 2014-07-19 MED ORDER — IBUPROFEN 800 MG PO TABS
800.0000 mg | ORAL_TABLET | Freq: Once | ORAL | Status: AC
  Administered 2014-07-19: 800 mg via ORAL
  Filled 2014-07-19: qty 1

## 2014-07-19 MED ORDER — HYDROCODONE-ACETAMINOPHEN 5-325 MG PO TABS
2.0000 | ORAL_TABLET | Freq: Once | ORAL | Status: AC
Start: 2014-07-19 — End: 2014-07-19
  Administered 2014-07-19: 1 via ORAL
  Filled 2014-07-19: qty 2

## 2014-07-19 MED ORDER — IBUPROFEN 800 MG PO TABS: 800.0000 mg | ORAL_TABLET | Freq: Three times a day (TID) | ORAL | Status: DC | PRN

## 2014-07-19 MED ORDER — SODIUM CHLORIDE 0.9 % IV BOLUS (SEPSIS): 1000.0000 mL | Freq: Once | INTRAVENOUS | Status: DC

## 2014-07-19 MED ORDER — OXYCODONE-ACETAMINOPHEN 5-325 MG PO TABS: 1.0000 | ORAL_TABLET | ORAL | Status: DC | PRN

## 2014-07-19 MED ORDER — MORPHINE SULFATE 4 MG/ML IJ SOLN
4.0000 mg | Freq: Once | INTRAMUSCULAR | Status: DC
Start: 2014-07-19 — End: 2014-07-19

## 2014-07-19 MED ORDER — ONDANSETRON 4 MG PO TBDP
4.0000 mg | ORAL_TABLET | Freq: Once | ORAL | Status: AC
  Administered 2014-07-19: 4 mg via ORAL
  Filled 2014-07-19: qty 1

## 2014-07-19 NOTE — ED Provider Notes (Signed)
This chart was scribed for [place], DO by Delphia Grates, ED Scribe. This patient was seen in room A05C/A05C and the patient's care was started at 2:49 AM.  TIME SEEN: 0249  CHIEF COMPLAINT: Loss of Consciousness  HPI:   HPI Comments: Katie Powell is a 24 y.o. female, with history of anemia, brought in by ambulance, who presents to the Emergency Department complaining of loss of consciousness that occurred yesterday evening. Patient states she was talking to her neighbor when she passed out. She denies any precipating factors. History of same several years ago but was not evaluated. Pt has posterior HA after fall, states she hit her head on concrete. Denies headache prior to fall. No numbness or focal weakness. No anti coagulation. No recent surgery, long travel, fracture, recent hospitalization, no exogenous estrogens. Patient does smoke. There is associated nausea. No vomiting, recent diarrhea, no black or bloody stools. Maternal history of CHF. She denies neck or back pain.   Denies any chest pain or shortness of breath prior to the fall or currently. States she was standing for several minutes before then a syncopal event.  ROS: See HPI Constitutional: no fever  Eyes: no drainage  ENT: no runny nose   Cardiovascular:  no chest pain  Resp: no SOB  GI: no vomiting GU: no dysuria Integumentary: no rash  Allergy: no hives  Musculoskeletal: no leg swelling  Neurological: no slurred speech ROS otherwise negative  PAST MEDICAL HISTORY/PAST SURGICAL HISTORY:  Past Medical History  Diagnosis Date  . Anemia     MEDICATIONS:  Prior to Admission medications   Medication Sig Start Date End Date Taking? Authorizing Provider  amoxicillin (AMOXIL) 500 MG capsule Take 1 capsule (500 mg total) by mouth 3 (three) times daily. 04/16/14   Tiffany Carlota Raspberry, PA-C  ibuprofen (ADVIL,MOTRIN) 800 MG tablet Take 1 tablet (800 mg total) by mouth 3 (three) times daily. Patient not taking:  Reported on 04/16/2014 09/12/12   Wandra Arthurs, MD  OVER THE COUNTER MEDICATION Take 1,000 mg by mouth every 4 (four) hours as needed (pain). Pt not sure which over the counter pain medicine but it was 500 mg per tablet    Historical Provider, MD  oxyCODONE-acetaminophen (PERCOCET) 5-325 MG per tablet Take 2 tablets by mouth every 4 (four) hours as needed for pain. 09/12/12   Wandra Arthurs, MD  predniSONE (DELTASONE) 10 MG tablet Take 4 tablets (40 mg total) by mouth daily. 04/17/14   Tiffany Carlota Raspberry, PA-C  pseudoephedrine (SUDAFED) 120 MG 12 hr tablet Take 1 tablet (120 mg total) by mouth 2 (two) times daily. Patient not taking: Reported on 04/16/2014 12/23/13   Junius Creamer, NP    ALLERGIES:  No Known Allergies  SOCIAL HISTORY:  History  Substance Use Topics  . Smoking status: Never Smoker   . Smokeless tobacco: Not on file  . Alcohol Use: No    FAMILY HISTORY: No family history on file.  EXAM:  Triage Vitals: BP 110/70 mmHg  Pulse 76  Temp(Src) 98.8 F (37.1 C) (Oral)  Resp 20  SpO2 100%  LMP 07/19/2014  CONSTITUTIONAL: Alert and oriented and responds appropriately to questions. Well-appearing; well-nourished; GCS 15 HEAD: Normocephalic; atraumatic. Occipital scalp tenderness without associated hematoma. EYES: Conjunctivae clear, PERRL, EOMI ENT: normal nose; no rhinorrhea; moist mucous membranes; pharynx without lesions noted; no dental injury; no septal hematoma NECK: Supple, no meningismus, no LAD; no midline spinal tenderness, step-off or deformity CARD: RRR; S1 and S2 appreciated; no  murmurs, no clicks, no rubs, no gallops RESP: Normal chest excursion without splinting or tachypnea; breath sounds clear and equal bilaterally; no wheezes, no rhonchi, no rales; chest wall stable, nontender to palpation ABD/GI: Normal bowel sounds; non-distended; soft, non-tender, no rebound, no guarding PELVIS:  stable, nontender to palpation BACK:  The back appears normal and is non-tender to  palpation, there is no CVA tenderness; no midline spinal tenderness, step-off or deformity EXT: Normal ROM in all joints; non-tender to palpation; no edema; normal capillary refill; no cyanosis    SKIN: Normal color for age and race; warm NEURO: Moves all extremities equally. Sensation to light touch intact diffusely; Cranial nerves II-XII intact. PSYCH: The patient's mood and manner are appropriate. Grooming and personal hygiene are appropriate.  MEDICAL DECISION MAKING: Patient here with syncopal event. She is hemodynamically stable, neurologically intact. Complaining of occipital pain. Doubt intracranial hemorrhage or skull fracture. This time I do not feel she needs head imaging. Blood glucose normal at 117. We'll check basic labs, urine pregnancy, EKG. Patient refuses IV at this time. We'll give oral pain and nausea medication. Suspect concussion. Will check orthostatic vital signs and encourage oral fluids. No history of coronary artery disease, arrhythmia. She is PERC negative.    ED PROGRESS: Patient is not pregnant.  EKG shows nonspecific T-wave changes with no old for comparison. There is no arrhythmia, interval changes, Brugada, LVH, delta wave, prolonged QTC. Patient does have a slight drop in her blood pressure with standing. Otherwise not orthostatic. Tolerating by mouth.  Her labs show sodium 142, potassium 3.4, chloride 107, calcium 1.2, bicarbonate 19, glucose 110, BUN 13, creatinine 0.8, hemoglobin 8.2, hematocrit 24.  She has a known history of anemia and has had a hemoglobin of 7.4 and 2014.   I feel patient is safe to be discharged home. She reports feeling better. We'll discharge with pain medication for her head injury. Discussed head injury return precautions. We'll advise her to increase her fluid intake and will give outpatient PCP follow-up information. Patient verbalizes understanding and is comfortable with plan.       EKG Interpretation  Date/Time:  Thursday July 19 2014 02:02:57 EST Ventricular Rate:  68 PR Interval:  173 QRS Duration: 77 QT Interval:  396 QTC Calculation: 421 R Axis:   56 Text Interpretation:  Age not entered, assumed to be  24 years old for purpose of ECG interpretation Sinus rhythm Consider left atrial enlargement Borderline T abnormalities, anterior leads No old tracing to compare Confirmed by Jataya Wann,  DO, Victorine Mcnee ([id number]) on 07/19/2014 4:13:36 AM         I personally performed the services described in this documentation, which was scribed in my presence. The recorded information has been reviewed and is accurate.    [place], DO 07/19/14 [phone]

## 2014-07-19 NOTE — ED Notes (Addendum)
Patient refuses to have an IV.

## 2014-07-19 NOTE — ED Notes (Signed)
Dr. Leonides Schanz at the bedside to update.

## 2014-07-19 NOTE — ED Notes (Signed)
Per EMS patient states she lost consciousness out side [facility] [place]. States she hit the back of her head. States happened between 3 and 5 pm 07/18/14. Pt states she tried to go to sleep by taking two 500 mg tablets of tylenol, but states pain did not get any better. States pain is worse when lying down.   BP 120/80 HR 90 RR 18 CBG 117

## 2014-07-19 NOTE — Discharge Instructions (Signed)
Head Injury °You have received a head injury. It does not appear serious at this time. Headaches and vomiting are common following head injury. It should be easy to awaken from sleeping. Sometimes it is necessary for you to stay in the emergency department for a while for observation. Sometimes admission to the hospital may be needed. After injuries such as yours, most problems occur within the first 24 hours, but side effects may occur up to 7-10 days after the injury. It is important for you to carefully monitor your condition and contact your health care provider or seek immediate medical care if there is a change in your condition. °WHAT ARE THE TYPES OF HEAD INJURIES? °Head injuries can be as minor as a bump. Some head injuries can be more severe. More severe head injuries include: °· A jarring injury to the brain (concussion). °· A bruise of the brain (contusion). This mean there is bleeding in the brain that can cause swelling. °· A cracked skull (skull fracture). °· Bleeding in the brain that collects, clots, and forms a bump (hematoma). °WHAT CAUSES A HEAD INJURY? °A serious head injury is most likely to happen to someone who is in a car wreck and is not wearing a seat belt. Other causes of major head injuries include bicycle or motorcycle accidents, sports injuries, and falls. °HOW ARE HEAD INJURIES DIAGNOSED? °A complete history of the event leading to the injury and your current symptoms will be helpful in diagnosing head injuries. Many times, pictures of the brain, such as CT or MRI are needed to see the extent of the injury. Often, an overnight hospital stay is necessary for observation.  °WHEN SHOULD I SEEK IMMEDIATE MEDICAL CARE?  °You should get help right away if: °· You have confusion or drowsiness. °· You feel sick to your stomach (nauseous) or have continued, forceful vomiting. °· You have dizziness or unsteadiness that is getting worse. °· You have severe, continued headaches not relieved by  medicine. Only take over-the-counter or prescription medicines for pain, fever, or discomfort as directed by your health care provider. °· You do not have normal function of the arms or legs or are unable to walk. °· You notice changes in the black spots in the center of the colored part of your eye (pupil). °· You have a clear or bloody fluid coming from your nose or ears. °· You have a loss of vision. °During the next 24 hours after the injury, you must stay with someone who can watch you for the warning signs. This person should contact local emergency services (911 in the [place] if you have seizures, you become unconscious, or you are unable to wake up. °HOW CAN I PREVENT A HEAD INJURY IN THE FUTURE? °The most important factor for preventing major head injuries is avoiding motor vehicle accidents.  To minimize the potential for damage to your head, it is crucial to wear seat belts while riding in motor vehicles. Wearing helmets while bike riding and playing collision sports (like football) is also helpful. Also, avoiding dangerous activities around the house will further help reduce your risk of head injury.  °WHEN CAN I RETURN TO NORMAL ACTIVITIES AND ATHLETICS? °You should be reevaluated by your health care provider before returning to these activities. If you have any of the following symptoms, you should not return to activities or contact sports until 1 week after the symptoms have stopped: °· Persistent headache. °· Dizziness or vertigo. °· Poor attention and concentration. °· Confusion. °·   Memory problems.  Nausea or vomiting.  Fatigue or tire easily.  Irritability.  Intolerant of bright lights or loud noises.  Anxiety or depression.  Disturbed sleep. MAKE SURE YOU:   Understand these instructions.  Will watch your condition.  Will get help right away if you are not doing well or get worse. Document Released: 04/27/2005 Document Revised: 05/02/2013 Document Reviewed:  01/02/2013 [facility] Patient Information 2015 [place], [place]. This information is not intended to replace advice given to you by your health care provider. Make sure you discuss any questions you have with your health care provider.  Syncope Syncope is a medical term for fainting or passing out. This means you lose consciousness and drop to the ground. People are generally unconscious for less than 5 minutes. You may have some muscle twitches for up to 15 seconds before waking up and returning to normal. Syncope occurs more often in older adults, but it can happen to anyone. While most causes of syncope are not dangerous, syncope can be a sign of a serious medical problem. It is important to seek medical care.  CAUSES  Syncope is caused by a sudden drop in blood flow to the brain. The specific cause is often not determined. Factors that can bring on syncope include:  Taking medicines that lower blood pressure.  Sudden changes in posture, such as standing up quickly.  Taking more medicine than prescribed.  Standing in one place for too long.  Seizure disorders.  Dehydration and excessive exposure to heat.  Low blood sugar (hypoglycemia).  Straining to have a bowel movement.  Heart disease, irregular heartbeat, or other circulatory problems.  Fear, emotional distress, seeing blood, or severe pain. SYMPTOMS  Right before fainting, you may:  Feel dizzy or light-headed.  Feel nauseous.  See all white or all black in your field of vision.  Have cold, clammy skin. DIAGNOSIS  Your health care provider will ask about your symptoms, perform a physical exam, and perform an electrocardiogram (ECG) to record the electrical activity of your heart. Your health care provider may also perform other heart or blood tests to determine the cause of your syncope which may include:  Transthoracic echocardiogram (TTE). During echocardiography, sound waves are used to evaluate how blood flows through  your heart.  Transesophageal echocardiogram (TEE).  Cardiac monitoring. This allows your health care provider to monitor your heart rate and rhythm in real time.  Holter monitor. This is a portable device that records your heartbeat and can help diagnose heart arrhythmias. It allows your health care provider to track your heart activity for several days, if needed.  Stress tests by exercise or by giving medicine that makes the heart beat faster. TREATMENT  In most cases, no treatment is needed. Depending on the cause of your syncope, your health care provider may recommend changing or stopping some of your medicines. HOME CARE INSTRUCTIONS  Have someone stay with you until you feel stable.  Do not drive, use machinery, or play sports until your health care provider says it is okay.  Keep all follow-up appointments as directed by your health care provider.  Lie down right away if you start feeling like you might faint. Breathe deeply and steadily. Wait until all the symptoms have passed.  Drink enough fluids to keep your urine clear or pale yellow.  If you are taking blood pressure or heart medicine, get up slowly and take several minutes to sit and then stand. This can reduce dizziness. [place]  IF:   You have a severe headache.  You have unusual pain in the chest, abdomen, or back.  You are bleeding from your mouth or rectum, or you have black or tarry stool.  You have an irregular or very fast heartbeat.  You have pain with breathing.  You have repeated fainting or seizure-like jerking during an episode.  You faint when sitting or lying down.  You have confusion.  You have trouble walking.  You have severe weakness.  You have vision problems. If you fainted, call your local emergency services (911 in [place] Do not drive yourself to the hospital.  MAKE SURE YOU:  Understand these instructions.  Will watch your condition.  Will get help right  away if you are not doing well or get worse. Document Released: 04/27/2005 Document Revised: 05/02/2013 Document Reviewed: 06/26/2011 [facility] Patient Information 2015 [place], [place]. This information is not intended to replace advice given to you by your health care provider. Make sure you discuss any questions you have with your health care provider.

## 2014-10-01 ENCOUNTER — Encounter (HOSPITAL_COMMUNITY): Payer: Self-pay | Admitting: Emergency Medicine

## 2014-10-01 ENCOUNTER — Emergency Department (HOSPITAL_COMMUNITY)
Admission: EM | Admit: 2014-10-01 | Discharge: 2014-10-01 | Disposition: A | Discharge status: Home / Self Care | Payer: Medicaid Other | Attending: Emergency Medicine | Admitting: Emergency Medicine

## 2014-10-01 DIAGNOSIS — A64 Unspecified sexually transmitted disease: Secondary | ICD-10-CM | POA: Insufficient documentation

## 2014-10-01 DIAGNOSIS — N898 Other specified noninflammatory disorders of vagina: Secondary | ICD-10-CM | POA: Insufficient documentation

## 2014-10-01 DIAGNOSIS — Z3202 Encounter for pregnancy test, result negative: Secondary | ICD-10-CM | POA: Insufficient documentation

## 2014-10-01 DIAGNOSIS — D509 Iron deficiency anemia, unspecified: Secondary | ICD-10-CM

## 2014-10-01 LAB — COMPREHENSIVE METABOLIC PANEL
ALK PHOS: 54 U/L (ref 38–126)
ALT: 10 U/L — AB (ref 14–54)
AST: 18 U/L (ref 15–41)
Albumin: 3.8 g/dL (ref 3.5–5.0)
Anion gap: 6 (ref 5–15)
BILIRUBIN TOTAL: 0.6 mg/dL (ref 0.3–1.2)
BUN: 10 mg/dL (ref 6–20)
CALCIUM: 8.9 mg/dL (ref 8.9–10.3)
CHLORIDE: 108 mmol/L (ref 101–111)
CO2: 23 mmol/L (ref 22–32)
CREATININE: 0.62 mg/dL (ref 0.44–1.00)
GFR calc non Af Amer: >60 mL/min (ref >60)
Glucose, Bld: 103 mg/dL — ABNORMAL HIGH (ref 65–99)
Potassium: 3.8 mmol/L (ref 3.5–5.1)
SODIUM: 137 mmol/L (ref 135–145)
Total Protein: 7.6 g/dL (ref 6.5–8.1)

## 2014-10-01 LAB — URINALYSIS, ROUTINE W REFLEX MICROSCOPIC
Bilirubin Urine: NEGATIVE
GLUCOSE, UA: NEGATIVE mg/dL
Ketones, ur: NEGATIVE mg/dL
Nitrite: NEGATIVE
PROTEIN: NEGATIVE mg/dL
SPECIFIC GRAVITY, URINE: 1.014 (ref 1.005–1.030)
UROBILINOGEN UA: 0.2 mg/dL (ref 0.0–1.0)
pH: 6.5 (ref 5.0–8.0)

## 2014-10-01 LAB — URINE MICROSCOPIC-ADD ON

## 2014-10-01 LAB — WET PREP, GENITAL
Clue Cells Wet Prep HPF POC: NONE SEEN
Trich, Wet Prep: NONE SEEN

## 2014-10-01 LAB — CBC WITH DIFFERENTIAL/PLATELET
Basophils Absolute: 0 10*3/uL (ref 0.0–0.1)
Basophils Relative: 0 % (ref 0–1)
EOS ABS: 0.1 10*3/uL (ref 0.0–0.7)
Eosinophils Relative: 2 % (ref 0–5)
HEMATOCRIT: 24.1 % — AB (ref 36.0–46.0)
HEMOGLOBIN: 6.7 g/dL — AB (ref 12.0–15.0)
LYMPHS PCT: 30 % (ref 12–46)
Lymphs Abs: 2 10*3/uL (ref 0.7–4.0)
MCH: 16.7 pg — ABNORMAL LOW (ref 26.0–34.0)
MCHC: 27.8 g/dL — ABNORMAL LOW (ref 30.0–36.0)
MCV: 60.1 fL — ABNORMAL LOW (ref 78.0–100.0)
Monocytes Absolute: 0.3 10*3/uL (ref 0.1–1.0)
Monocytes Relative: 4 % (ref 3–12)
NEUTROS ABS: 4.4 10*3/uL (ref 1.7–7.7)
NEUTROS PCT: 64 % (ref 43–77)
Platelets: 444 10*3/uL — ABNORMAL HIGH (ref 150–400)
RBC: 4.01 MIL/uL (ref 3.87–5.11)
RDW: 20.4 % — AB (ref 11.5–15.5)
WBC: 6.8 10*3/uL (ref 4.0–10.5)

## 2014-10-01 LAB — POC URINE PREG, ED: PREG TEST UR: NEGATIVE

## 2014-10-01 LAB — LIPASE, BLOOD: Lipase: 28 U/L (ref 22–51)

## 2014-10-01 MED ORDER — AZITHROMYCIN 250 MG PO TABS
1000.0000 mg | ORAL_TABLET | Freq: Once | ORAL | Status: AC
  Administered 2014-10-01: 1000 mg via ORAL
  Filled 2014-10-01: qty 4

## 2014-10-01 MED ORDER — CEFTRIAXONE SODIUM 250 MG IJ SOLR
250.0000 mg | Freq: Once | INTRAMUSCULAR | Status: AC
  Administered 2014-10-01: 250 mg via INTRAMUSCULAR
  Filled 2014-10-01: qty 250

## 2014-10-01 MED ORDER — FERROUS SULFATE 325 (65 FE) MG PO TABS: 325.0000 mg | ORAL_TABLET | Freq: Every day | ORAL | Status: DC

## 2014-10-01 NOTE — ED Provider Notes (Signed)
CSN: [id number]     Arrival date & time 10/01/14  1926 History   First MD Initiated Contact with Patient 10/01/14 2147     Chief Complaint  Patient presents with  . Abdominal Pain    The patient said she has been having abdominal pain for a week.  She also says she has been having white, thick discharge that has a foiul odor.     (Consider location/radiation/quality/duration/timing/severity/associated sxs/prior Treatment) HPI  Katie Powell is a 24 year old female presenting with suprapubic abdominal pain and dysuria. She states he symptoms began about a week ago. She states she was contacted by boyfriend and made aware that she may have been exposed to sexually transmitted infection. She also reports thick white vaginal discharge with some vaginal itching and foul odor. She rates her discomfort as an 8/10. She is currently on her menstrual period. She reports some mild nausea but denies any fevers, chills, or vomiting  Past Medical History  Diagnosis Date  . Anemia    Past Surgical History  Procedure Laterality Date  . Finger surgery     History reviewed. No pertinent family history. History  Substance Use Topics  . Smoking status: Never Smoker   . Smokeless tobacco: Not on file  . Alcohol Use: No   OB History    No data available     Review of Systems  Constitutional: Negative for fever and chills.  HENT: Negative for sore throat.   Eyes: Negative for visual disturbance.  Respiratory: Negative for cough and shortness of breath.   Cardiovascular: Negative for chest pain and leg swelling.  Gastrointestinal: Positive for nausea and abdominal pain. Negative for vomiting and diarrhea.  Genitourinary: Positive for vaginal discharge and vaginal pain. Negative for dysuria.  Musculoskeletal: Negative for myalgias.  Skin: Negative for rash.  Neurological: Negative for weakness, numbness and headaches.      Allergies  Review of patient's allergies indicates no known  allergies.  Home Medications   Prior to Admission medications   Medication Sig Start Date End Date Taking? Authorizing Provider  acetaminophen (TYLENOL) 500 MG tablet Take 1,000 mg by mouth every 6 (six) hours as needed for mild pain, moderate pain, fever or headache.   Yes Historical Provider, MD   BP 118/72 mmHg  Pulse 81  Temp(Src) 98.7 F (37.1 C) (Oral)  Resp 22  Ht 5\' 6"  (1.676 m)  Wt 163 lb (73.936 kg)  BMI 26.32 kg/m2  SpO2 99%  LMP 10/01/2014 Physical Exam  Constitutional: She is oriented to person, place, and time. She appears well-developed and well-nourished. No distress.  HENT:  Head: Normocephalic and atraumatic.  Mouth/Throat: Oropharynx is clear and moist.  Eyes: Conjunctivae are normal.  Neck: Neck supple.  Cardiovascular: Normal rate, regular rhythm and intact distal pulses.   Pulmonary/Chest: Effort normal and breath sounds normal. No respiratory distress. She has no wheezes. She has no rales. She exhibits no tenderness.  Abdominal: Soft. She exhibits no distension and no mass. There is tenderness in the suprapubic area. There is no rigidity, no rebound, no guarding, no CVA tenderness, no tenderness at McBurney's point and negative Murphy's sign.    Genitourinary: Cervix exhibits no motion tenderness. Right adnexum displays no tenderness. Left adnexum displays no tenderness. There is bleeding in the vagina.  Currently on menses, no adnexal or CMT  Musculoskeletal: She exhibits no tenderness.  Lymphadenopathy:    She has no cervical adenopathy.  Neurological: She is alert and oriented to person, place, and time.  Skin: Skin is warm and dry. No rash noted. She is not diaphoretic.  Psychiatric: She has a normal mood and affect.  Nursing note and vitals reviewed.   ED Course  Procedures (including critical care time) Labs Review Labs Reviewed  WET PREP, GENITAL - Abnormal; Notable for the following:    Yeast Wet Prep HPF POC FEW YEAST (*)    WBC, Wet Prep  HPF POC MODERATE (*)    All other components within normal limits  CBC WITH DIFFERENTIAL/PLATELET - Abnormal; Notable for the following:    Hemoglobin 6.7 (*)    HCT 24.1 (*)    MCV 60.1 (*)    MCH 16.7 (*)    MCHC 27.8 (*)    RDW 20.4 (*)    Platelets 444 (*)    All other components within normal limits  COMPREHENSIVE METABOLIC PANEL - Abnormal; Notable for the following:    Glucose, Bld 103 (*)    ALT 10 (*)    All other components within normal limits  URINALYSIS, ROUTINE W REFLEX MICROSCOPIC - Abnormal; Notable for the following:    Hgb urine dipstick LARGE (*)    Leukocytes, UA TRACE (*)    All other components within normal limits  GC/CHLAMYDIA PROBE AMP ([place]) - Abnormal; Notable for the following:    Chlamydia **POSITIVE** (*)    All other components within normal limits  LIPASE, BLOOD  URINE MICROSCOPIC-ADD ON  POC URINE PREG, ED    Imaging Review No results found.   EKG Interpretation None      MDM   Final diagnoses:  STD (female)  Iron deficiency anemia   24 yo female possible STD exposure and vaginal complaints. Discussed importance of using protection when sexually active. Pt understands that they have GC/Chlamydia cultures pending and that they will need to inform all sexual partners if results return positive. Pt has been treated prophylacticly with azithromycin and rocephin due to pts history, pelvic exam, and wet prep with increased WBCs. Pt not concerning for PID because hemodynamically stable and no cervical motion tenderness on pelvic exam. On review of labs, pt's hgb is 6.7, this appears to be a chronic iron deficiency anemia as her MCV is low and her blood pressure and heart rate are normal and she is asymptomatic. Discussed case with Dr. Mingo Amber.  Due to the severity of the anemia, discussed with pt, recommendations to admit to the hospital for transfusion.  Pt declines admission, reporting she feels fine and is aware of this ongoing problem and  had not been taking her iron tablets because she did not realize how serious it was, but she will now start taking her iron and will follow-up for for re-check and iron dosage adjustments. Pt is well-appearing, in no acute distress and vital signs reviewed and not concerning. She appears safe to be discharged.  Discharge include resources to establish care with a PCP and Gyn.  Return precautions provided. Pt aware of plan and in agreement.    Filed Vitals:   10/01/14 1952 10/01/14 2321  BP: 118/72 107/66  Pulse: 81 87  Temp: 98.7 F (37.1 C) 98.1 F (36.7 C)  TempSrc: Oral Oral  Resp: 22 18  Height: 5\' 6"  (1.676 m)   Weight: 163 lb (73.936 kg)   SpO2: 99% 99%   Meds given in ED:  Medications  azithromycin (ZITHROMAX) tablet 1,000 mg (1,000 mg Oral Given 10/01/14 2319)  cefTRIAXone (ROCEPHIN) injection 250 mg (250 mg Intramuscular Given 10/01/14 2319)  Discharge Medication List as of 10/01/2014 11:17 PM    START taking these medications   Details  ferrous sulfate 325 (65 FE) MG tablet Take 1 tablet (325 mg total) by mouth daily., Starting 10/01/2014, Until Discontinued, Print           Britt Bottom, NP 10/03/14 1328  Evelina Bucy, MD 10/04/14 [phone]

## 2014-10-01 NOTE — Discharge Instructions (Signed)
Please follow the directions provided. Is very important to call tomorrow to tablets care with a primary care doctor to help you manage this iron deficiency anemia. Please start taking the iron every morning when a glass of orange juice. He will need to be reevaluated to determine if you need more iron tablets during the day. You have been treated for sexual transmitted infection in the emergency room today. You will receive a phone call in a few days if your cultures are positive. As always please practice protected intercourse. Don't hesitate to return for any new, worsening, or concerning symptoms.   SEEK IMMEDIATE MEDICAL CARE IF:  You faint. If this happens, do not drive. Call your local emergency services (911 in U.S.) if no other help is available.  You have chest pain.  You feel nauseous or vomit.  You have severe or increased shortness of breath with activity.  You feel weak.  You have a rapid heartbeat.  You have unexplained sweating.  You become light-headed when getting up from a chair or bed.   Emergency Department Resource Guide 1) Find a Doctor and Pay Out of Pocket Although you won't have to find out who is covered by your insurance plan, it is a good idea to ask around and get recommendations. You will then need to call the office and see if the doctor you have chosen will accept you as a new patient and what types of options they offer for patients who are self-pay. Some doctors offer discounts or will set up payment plans for their patients who do not have insurance, but you will need to ask so you aren't surprised when you get to your appointment.  2) Contact Your Local Health Department Not all health departments have doctors that can see patients for sick visits, but many do, so it is worth a call to see if yours does. If you don't know where your local health department is, you can check in your phone book. The CDC also has a tool to help you locate your state's health  department, and many state websites also have listings of all of their local health departments.  3) Find a [facility] If your illness is not likely to be very severe or complicated, you may want to try a walk in clinic. These are popping up all over the country in pharmacies, drugstores, and shopping centers. They're usually staffed by nurse practitioners or physician assistants that have been trained to treat common illnesses and complaints. They're usually fairly quick and inexpensive. However, if you have serious medical issues or chronic medical problems, these are probably not your best option.  No Primary Care Doctor: - Call Health Connect at  [phone] - they can help you locate a primary care doctor that  accepts your insurance, provides certain services, etc. - Physician Referral Service- [phone]  Chronic Pain Problems: Organization         Address  Phone   Notes  [facility]  [phone] Patients need to be referred by their primary care doctor.   Medication Assistance: Organization         Address  Phone   Notes  [facility] Medication [facility] [address] [place], [place] [address] [phone] --Must be a resident of [facility] -- Must have NO insurance coverage whatsoever (no Medicaid/ Medicare, etc.) -- The pt. MUST have a primary care doctor that directs their care regularly and follows them in  the community   MedAssist  [phone]   [place]  [phone]    Agencies that provide inexpensive medical care: Organization         Address  Phone   Notes  [place]  [phone]   [facility]    [phone]   [facility] [place], [place] [address] [phone]   [place] [address], [place] [phone]   Planned Parenthood    [phone]   [facility]    [phone]    [place] and [address], [place] Phone:  [phone], Fax:  [phone] Hours of Operation:  9 am - 6 pm, M-F.  Also accepts Medicaid/Medicare and self-pay.  [facility] for [address], [address], [place] Phone: [phone], Fax: [phone]. Hours of Operation:  8:30 am - 5:30 pm, M-F.  Also accepts Medicaid and self-pay.  [facility] [address] [place] Phone: [phone]   [place], [place], [place] [phone], Ext. [phone] Mondays & Thursdays: 7-9 AM.  First 15 patients are seen on a first come, first serve basis.    [place] Providers:  Organization         Address  Phone   Notes  [facility] [address], [place] [phone] Also accepts self-pay patients.  [facility] [address], [place]  [phone]   [place], [address], [place] [phone]   [facility] [address] [place] [phone]   Lucianne Lei [address], [place]   [phone] Only accepts [place] Access [place] patients after they have their name applied to their card.   Self-Pay (no insurance) in [facility]:  Organization         Address  Phone   Notes  Sickle Cell Patients, [facility] [place] [phone]   [facility] [place] [phone]   [facility] [place]  [place], [place],  [phone]   [facility]/Dr. Osei-Bonsu  [address] [place] or [address], [place] [phone] Phone number for both [place] and [place] locations is the same.  Urgent Medical and [facility] [address], [place] [phone]    [facility] [address] [place] or [address] [phone]   [facility] [address] [place] [phone], phone; [phone], fax Sees patients 1st and 3rd Saturday of every month.  Must not qualify for public or private insurance (i.e. Medicaid, Medicare, [place] Health Choice, Veterans' Benefits)  Household income should be no more than 200% of the poverty level The clinic cannot treat you if you are pregnant or think you are pregnant  Sexually transmitted diseases are not treated at the clinic.    Dental Care: Organization         Address  Phone  Notes  [facility] Department of [facility] [address] [place], [place] [phone] Accepts children up to age 70 who are enrolled in [place]  or [place] Health Choice; pregnant women with a Medicaid card; and children who have applied for Medicaid or [place] Health Choice, but were declined, whose parents can pay a reduced fee at time of service.  [facility] Department of [facility]  [address], [place] [phone] Accepts children up to age 42 who are enrolled in [place] or [place]; pregnant women with a Medicaid card; and children who have applied for Medicaid or [place] Health Choice, but were declined, whose parents can pay a reduced fee at time of service.  [facility] Adult [facility] Access PROGRAM  [place] [phone] Patients are seen by appointment only. Walk-ins are not accepted. [place] will see patients 45 years of age and older. Monday - Tuesday (8am-5pm) Most Wednesdays (8:30-5pm) $30 per visit, cash only  [facility] Adult Dental Access PROGRAM  [address], [facility] [phone] Patients are seen by appointment only. Walk-ins are not accepted. [place] will see patients 46 years of age and older. One Wednesday Evening (Monthly: Volunteer Based).   $30 per visit, cash only  [place]  [phone] for adults; Children under age 47, call Graduate Pediatric Dentistry at [phone]. Children aged 71-14, please call [phone] to request a pediatric application.  Dental services are provided in all areas of dental care including fillings, crowns and bridges, complete and partial dentures, implants, gum treatment, root canals, and extractions. Preventive care is also provided. Treatment is provided to both adults and children. Patients are selected via a lottery and there is often a waiting list.   [facility] [address], [place]  [phone] [email or web address]   [facility] [address] [place], [place] [phone], Ext. [phone] Second and Fourth Thursday of each month, opens at 6:30 AM; Clinic ends at 9 AM.  Patients are seen on a first-come first-served basis, and a limited number are seen during each clinic.   [facility]  [address] [place], [place] [phone]   Eligibility Requirements You must have lived in [place], [place], or [place] counties for at least the last three months.   You cannot be eligible for state or federal sponsored [employer], including [employer], [place], or [employer].   You generally cannot be eligible for healthcare insurance through your employer.    How to apply: Eligibility screenings are held every Tuesday and Wednesday afternoon from 1:00 pm until 4:00 pm. You do not need an appointment for the interview!  [facility] [address] [place], [place]   [place]  [place] Department  [place]  [phone]    Behavioral Health Resources in the Community: Intensive Outpatient Programs Organization         Address  Phone  Notes  [place]. [address], [place], [place] [phone]   [facility]patient [address], [place], [place]   ADS: Alcohol & Drug S[facility] [address] [place], [place]   [place] [address],  [place], [place] or [phone]   Substance Abuse Resources Organization         Address  Phone  Notes  Alcohol and Drug Services  [place]  [phone]   [facility] [place]  House  9140908124   [place]inita Pester  [phone]   Residential & Outpatient Substance Abuse Program  [phone]   Psychological Services Organization         Address  Phone  Notes  [place]  [place]  [phone]   [place] [address] [place] or [phone]    Mobile Crisis Teams Organization         Address  Phone  Notes  [facility], Mobile Crisis Care Unit  [phone]   [facility]  [address] [place], [place]   [place] Levels [address] [place] [phone]    Self-Help/Support Groups Organization         Address  Phone             Notes  [place]. of [place] - variety of support groups  [place] Call for more information  Narcotics Anonymous (NA), [facility] [address], [employer] [place]  2 meetings at this location   Special educational needs teacher         Address  Phone  Notes  [facility] Residential Treatment [place],    [place]  [phone]   [facility]  [address] [place] [address], [place], [place]   [place], [place] [phone] Admissions: 8am-3pm M-F  Incentives Substance [place] [address]    [place], [place] [phone]   [facility] [address] [place], [place], [place]   [facility] [address]  [place], [place]   [facility] Programs - Intensive Outpatient [address], [place], [place]   [facility] [place].,  [place], [place] [phone] or [phone]   [facility]idential [facility] Services (RTS) [address], [place], [place] Accepts Medicaid  [facility] [place] [address],  [place] [phone] Substance Abuse/Addiction Treatment   [facility] Organization         Address  Phone  Notes  [facility]  [phone]   Domenic Schwab, PhD [address] Arlis Porta [place], [place]   [phone] or [phone]   [place], [place] [phone]   [facility] [address], [place], [place] [phone] Insurance/Medicaid/sponsorship through [facility] and Families [address], Ste [place]                                    [place], [place] [phone] [place] [address] [place], [place] [phone]    Dr. Adele Schilder  [phone]   [facility] Dept. 1) [address] [place] 2) [place] 3)  [place] [address], [place] [phone]  [phone]   [place] [phone] or [phone] (After Hours)

## 2014-10-01 NOTE — ED Notes (Signed)
Pt. Left with all belongings and refused wheelchair 

## 2014-10-01 NOTE — ED Notes (Signed)
The patient said she has been having abdominal pain for a week.  She also says she has been having white, thick discharge that has a foiul odor.  The patient also says she has been peeing a lot but denies any burning.  She did say she has been itching as well.  She rates her pain 8/10.

## 2014-10-02 LAB — GC/CHLAMYDIA PROBE AMP (~~LOC~~) NOT AT ARMC
Chlamydia: POSITIVE — AB
Neisseria Gonorrhea: NEGATIVE

## 2014-10-03 ENCOUNTER — Telehealth (HOSPITAL_COMMUNITY): Payer: Self-pay

## 2014-10-03 NOTE — ED Notes (Signed)
Positive for chlamydia. Treated per protocol. DHHS form faxed. Attempting to contact pt

## 2014-10-04 ENCOUNTER — Telehealth (HOSPITAL_BASED_OUTPATIENT_CLINIC_OR_DEPARTMENT_OTHER): Payer: Self-pay | Admitting: Emergency Medicine

## 2014-10-09 ENCOUNTER — Ambulatory Visit: Payer: Medicaid Other | Attending: Physician Assistant | Admitting: Physician Assistant

## 2014-10-09 VITALS — BP 104/71 | HR 79 | Temp 98.4°F | Resp 18 | Ht 66.0 in | Wt 157.6 lb

## 2014-10-09 DIAGNOSIS — D649 Anemia, unspecified: Secondary | ICD-10-CM | POA: Diagnosis not present

## 2014-10-09 MED ORDER — FERROUS SULFATE 325 (65 FE) MG PO TABS: 325.0000 mg | ORAL_TABLET | Freq: Two times a day (BID) | ORAL | Status: DC

## 2014-10-09 NOTE — Progress Notes (Signed)
[name]  [id number]  [id number]  DOB - 17-May-1990  Chief Complaint  Patient presents with  . Follow-up  . Establish Care  . low hemoglobin       Subjective:   Katie Powell is a 24 y.o. female here today for establishment of care. She was in the emergency department on 10/01/2014 after recent STD exposure. Her DNA probe came back positive for chlamydia. She was appropriately treated in the emergency department. A CBC was done and her hemoglobin was noted to be 6.7. She states for at least 1 year she is known about her anemia. She further states that she does have heavy menstruation. She bleeds for at least 8 days. Sometimes she has twice monthly cycles. She denies any hematuria. Occasionally she has dark stools. Occasionally she suffers from constipation. She was placed on iron once daily in the emergency department.  ROS: GEN: denies fever or chills, denies change in weight LUNGS: denies SHOB, dyspnea, PND, orthopnea CV: denies CP or palpitations ABD: denies abd pain, N or V GU: occasional blood in stool. Heavy menses. No hematuria  ALLERGIES: No Known Allergies  PAST MEDICAL HISTORY: Past Medical History  Diagnosis Date  . Anemia     PAST SURGICAL HISTORY: Past Surgical History  Procedure Laterality Date  . Finger surgery      MEDICATIONS AT HOME: Prior to Admission medications   Medication Sig Start Date End Date Taking? Authorizing Provider  ferrous sulfate 325 (65 FE) MG tablet Take 1 tablet (325 mg total) by mouth 2 (two) times daily with a meal. 10/09/14  Yes Hari Casaus S Perfecto Purdy, PA-C  acetaminophen (TYLENOL) 500 MG tablet Take 1,000 mg by mouth every 6 (six) hours as needed for mild pain, moderate pain, fever or headache.    Historical Provider, MD     Objective:   Filed Vitals:   10/09/14 1149  BP: 104/71  Pulse: 79  Temp: 98.4 F (36.9 C)  TempSrc: Oral  Resp: 18  Height: 5\' 6"  (1.676 m)  Weight: 157 lb 9.6 oz (71.487 kg)  SpO2: 100%      Exam General appearance : Awake, alert, not in any distress. Speech Clear. Not toxic looking HEENT: Atraumatic and Normocephalic, pupils equally reactive to light and accomodation Chest:Good air entry bilaterally, no added sounds  CVS: S1 S2 regular, no murmurs.  Abdomen: Bowel sounds present, Non tender and not distended with no gaurding, rigidity or rebound.   Data Review No results found for: HGBA1C   Assessment & Plan  1. Anemia, unclear etiology ? 2/2 DUB  -Iron panel   -increase FeSO4 BID with meals and a shot of orange juice  -recheck in approx 1 mo and consider GI/GYN referral 2. Recent STD exposure  -treated in the Ed and contacts informed per pt  -Condoms offered     Return in about 4 weeks (around 11/06/2014).  The patient was given clear instructions to go to ER or return to medical center if symptoms don't improve, worsen or new problems develop. The patient verbalized understanding. The patient was told to call to get lab results if they haven't heard anything in the next week.   This note has been created with Education officer, environmental. Any transcriptional errors are unintentional.    Scot Jun, PA-C [facility] and [facility] [place], [place] [phone]   10/09/2014, 12:10 PM

## 2014-10-09 NOTE — Progress Notes (Signed)
Patient here for hospital follow up for low hemoglobin. Patient was in the hospital last Tuesday. Patient would like to establish care with a primary care physician during this visit. Patient had a fall in March, 2016 due to passing out. Patient was seen in the ER and was notified that her hemoglobin was low.

## 2014-10-10 LAB — CBC WITH DIFFERENTIAL/PLATELET
Basophils Absolute: 0.1 10*3/uL (ref 0.0–0.1)
Basophils Relative: 1 % (ref 0–1)
EOS ABS: 0.2 10*3/uL (ref 0.0–0.7)
Eosinophils Relative: 4 % (ref 0–5)
HEMATOCRIT: 27 % — AB (ref 36.0–46.0)
Hemoglobin: 7.4 g/dL — ABNORMAL LOW (ref 12.0–15.0)
LYMPHS ABS: 2.1 10*3/uL (ref 0.7–4.0)
Lymphocytes Relative: 35 % (ref 12–46)
MCH: 16.7 pg — AB (ref 26.0–34.0)
MCHC: 27.4 g/dL — ABNORMAL LOW (ref 30.0–36.0)
MCV: 60.8 fL — ABNORMAL LOW (ref 78.0–100.0)
MONO ABS: 0.5 10*3/uL (ref 0.1–1.0)
MONOS PCT: 8 % (ref 3–12)
Neutro Abs: 3.1 10*3/uL (ref 1.7–7.7)
Neutrophils Relative %: 52 % (ref 43–77)
Platelets: 444 10*3/uL — ABNORMAL HIGH (ref 150–400)
RBC: 4.44 MIL/uL (ref 3.87–5.11)
RDW: 22 % — ABNORMAL HIGH (ref 11.5–15.5)
WBC: 6 10*3/uL (ref 4.0–10.5)

## 2014-10-12 LAB — IBC PANEL
%SAT: 16 % — ABNORMAL LOW (ref 20–55)
TIBC: 402 ug/dL (ref 250–470)
UIBC: 339 ug/dL (ref 125–400)

## 2014-10-12 LAB — IRON: Iron: 63 ug/dL (ref 42–145)

## 2014-10-24 ENCOUNTER — Other Ambulatory Visit: Payer: Self-pay

## 2014-10-24 ENCOUNTER — Emergency Department (HOSPITAL_COMMUNITY): Payer: Medicaid Other

## 2014-10-24 ENCOUNTER — Emergency Department (HOSPITAL_COMMUNITY)
Admission: EM | Admit: 2014-10-24 | Discharge: 2014-10-24 | Disposition: A | Discharge status: Home / Self Care | Payer: Medicaid Other | Attending: Emergency Medicine | Admitting: Emergency Medicine

## 2014-10-24 ENCOUNTER — Encounter (HOSPITAL_COMMUNITY): Payer: Self-pay | Admitting: Emergency Medicine

## 2014-10-24 DIAGNOSIS — D649 Anemia, unspecified: Secondary | ICD-10-CM

## 2014-10-24 DIAGNOSIS — R0602 Shortness of breath: Secondary | ICD-10-CM | POA: Insufficient documentation

## 2014-10-24 DIAGNOSIS — Z3202 Encounter for pregnancy test, result negative: Secondary | ICD-10-CM | POA: Insufficient documentation

## 2014-10-24 DIAGNOSIS — R079 Chest pain, unspecified: Secondary | ICD-10-CM | POA: Insufficient documentation

## 2014-10-24 DIAGNOSIS — E611 Iron deficiency: Secondary | ICD-10-CM | POA: Insufficient documentation

## 2014-10-24 DIAGNOSIS — R5383 Other fatigue: Secondary | ICD-10-CM | POA: Insufficient documentation

## 2014-10-24 DIAGNOSIS — Z720 Tobacco use: Secondary | ICD-10-CM | POA: Insufficient documentation

## 2014-10-24 LAB — COMPREHENSIVE METABOLIC PANEL
ALT: 11 U/L — ABNORMAL LOW (ref 14–54)
ANION GAP: 7 (ref 5–15)
AST: 21 U/L (ref 15–41)
Albumin: 3.7 g/dL (ref 3.5–5.0)
Alkaline Phosphatase: 52 U/L (ref 38–126)
BILIRUBIN TOTAL: 0.9 mg/dL (ref 0.3–1.2)
BUN: 11 mg/dL (ref 6–20)
CALCIUM: 9 mg/dL (ref 8.9–10.3)
CO2: 23 mmol/L (ref 22–32)
CREATININE: 0.71 mg/dL (ref 0.44–1.00)
Chloride: 106 mmol/L (ref 101–111)
GFR calc non Af Amer: >60 mL/min (ref >60)
GLUCOSE: 77 mg/dL (ref 65–99)
Potassium: 4 mmol/L (ref 3.5–5.1)
Sodium: 136 mmol/L (ref 135–145)
Total Protein: 7.4 g/dL (ref 6.5–8.1)

## 2014-10-24 LAB — URINE MICROSCOPIC-ADD ON

## 2014-10-24 LAB — URINALYSIS, ROUTINE W REFLEX MICROSCOPIC
BILIRUBIN URINE: NEGATIVE
Glucose, UA: NEGATIVE mg/dL
Hgb urine dipstick: NEGATIVE
KETONES UR: NEGATIVE mg/dL
Nitrite: NEGATIVE
PH: 6.5 (ref 5.0–8.0)
PROTEIN: NEGATIVE mg/dL
Specific Gravity, Urine: 1.018 (ref 1.005–1.030)
Urobilinogen, UA: 1 mg/dL (ref 0.0–1.0)

## 2014-10-24 LAB — CBC WITH DIFFERENTIAL/PLATELET
Basophils Absolute: 0 10*3/uL (ref 0.0–0.1)
Basophils Relative: 0 % (ref 0–1)
Eosinophils Absolute: 0.1 10*3/uL (ref 0.0–0.7)
Eosinophils Relative: 3 % (ref 0–5)
HCT: 30.8 % — ABNORMAL LOW (ref 36.0–46.0)
HEMOGLOBIN: 9 g/dL — AB (ref 12.0–15.0)
LYMPHS ABS: 1.7 10*3/uL (ref 0.7–4.0)
Lymphocytes Relative: 36 % (ref 12–46)
MCH: 20.2 pg — ABNORMAL LOW (ref 26.0–34.0)
MCHC: 29.2 g/dL — ABNORMAL LOW (ref 30.0–36.0)
MCV: 69.2 fL — ABNORMAL LOW (ref 78.0–100.0)
MONOS PCT: 12 % (ref 3–12)
Monocytes Absolute: 0.6 10*3/uL (ref 0.1–1.0)
NEUTROS ABS: 2.2 10*3/uL (ref 1.7–7.7)
NEUTROS PCT: 49 % (ref 43–77)
Platelets: 272 10*3/uL (ref 150–400)
RBC: 4.45 MIL/uL (ref 3.87–5.11)
RDW: 30.8 % — AB (ref 11.5–15.5)
WBC: 4.6 10*3/uL (ref 4.0–10.5)

## 2014-10-24 LAB — OCCULT BLOOD X 1 CARD TO LAB, STOOL: Fecal Occult Bld: NEGATIVE

## 2014-10-24 LAB — I-STAT BETA HCG BLOOD, ED (MC, WL, AP ONLY): I-stat hCG, quantitative: 6.1 m[IU]/mL — ABNORMAL HIGH (ref <5)

## 2014-10-24 LAB — POC URINE PREG, ED: Preg Test, Ur: NEGATIVE

## 2014-10-24 LAB — TROPONIN I: Troponin I: <0.03 ng/mL (ref <0.031)

## 2014-10-24 LAB — HCG, QUANTITATIVE, PREGNANCY

## 2014-10-24 LAB — SAMPLE TO BLOOD BANK

## 2014-10-24 MED ORDER — FERROUS SULFATE 325 (65 FE) MG PO TABS: 325.0000 mg | ORAL_TABLET | Freq: Two times a day (BID) | ORAL | Status: DC

## 2014-10-24 NOTE — Discharge Instructions (Signed)
Please call your doctor for a followup appointment within 24-48 hours. When you talk to your doctor please let them know that you were seen in the emergency department and have them acquire all of your records so that they can discuss the findings with you and formulate a treatment plan to fully care for your new and ongoing problems. Please follow-up with your primary care provider Please continue to take iron pills as prescribed Please rest and stay hydrated Please continue to monitor symptoms closely and if symptoms are to worsen or change (fever greater than 101, chills, sweating, nausea, vomiting, chest pain, shortness of breathe, difficulty breathing, weakness, numbness, tingling, worsening or changes to pain pattern, fainting, dizziness, coughing up blood, leg swelling, travels, visual changes, changes to skin color) please report back to the Emergency Department immediately.   Anemia, Nonspecific Anemia is a condition in which the concentration of red blood cells or hemoglobin in the blood is below normal. Hemoglobin is a substance in red blood cells that carries oxygen to the tissues of the body. Anemia results in not enough oxygen reaching these tissues.  CAUSES  Common causes of anemia include:   Excessive bleeding. Bleeding may be internal or external. This includes excessive bleeding from periods (in women) or from the intestine.   Poor nutrition.   Chronic kidney, thyroid, and liver disease.  Bone marrow disorders that decrease red blood cell production.  Cancer and treatments for cancer.  HIV, AIDS, and their treatments.  Spleen problems that increase red blood cell destruction.  Blood disorders.  Excess destruction of red blood cells due to infection, medicines, and autoimmune disorders. SIGNS AND SYMPTOMS   Minor weakness.   Dizziness.   Headache.  Palpitations.   Shortness of breath, especially with exercise.   Paleness.  Cold  sensitivity.  Indigestion.  Nausea.  Difficulty sleeping.  Difficulty concentrating. Symptoms may occur suddenly or they may develop slowly.  DIAGNOSIS  Additional blood tests are often needed. These help your health care provider determine the best treatment. Your health care provider will check your stool for blood and look for other causes of blood loss.  TREATMENT  Treatment varies depending on the cause of the anemia. Treatment can include:   Supplements of iron, vitamin B63, or folic acid.   Hormone medicines.   A blood transfusion. This may be needed if blood loss is severe.   Hospitalization. This may be needed if there is significant continual blood loss.   Dietary changes.  Spleen removal. HOME CARE INSTRUCTIONS Keep all follow-up appointments. It often takes many weeks to correct anemia, and having your health care provider check on your condition and your response to treatment is very important. SEEK IMMEDIATE MEDICAL CARE IF:   You develop extreme weakness, shortness of breath, or chest pain.   You become dizzy or have trouble concentrating.  You develop heavy vaginal bleeding.   You develop a rash.   You have bloody or black, tarry stools.   You faint.   You vomit up blood.   You vomit repeatedly.   You have abdominal pain.  You have a fever or persistent symptoms for more than 2-3 days.   You have a fever and your symptoms suddenly get worse.   You are dehydrated.  MAKE SURE YOU:  Understand these instructions.  Will watch your condition.  Will get help right away if you are not doing well or get worse. Document Released: 06/04/2004 Document Revised: 12/28/2012 Document Reviewed: 10/21/2012 ExitCare Patient  Information 2015 [place], [place]. This information is not intended to replace advice given to you by your health care provider. Make sure you discuss any questions you have with your health care provider.   Emergency  Department Resource Guide 1) Find a Doctor and Pay Out of Pocket Although you won't have to find out who is covered by your insurance plan, it is a good idea to ask around and get recommendations. You will then need to call the office and see if the doctor you have chosen will accept you as a new patient and what types of options they offer for patients who are self-pay. Some doctors offer discounts or will set up payment plans for their patients who do not have insurance, but you will need to ask so you aren't surprised when you get to your appointment.  2) Contact Your Local Health Department Not all health departments have doctors that can see patients for sick visits, but many do, so it is worth a call to see if yours does. If you don't know where your local health department is, you can check in your phone book. The CDC also has a tool to help you locate your state's health department, and many state websites also have listings of all of their local health departments.  3) Find a [facility] If your illness is not likely to be very severe or complicated, you may want to try a walk in clinic. These are popping up all over the country in pharmacies, drugstores, and shopping centers. They're usually staffed by nurse practitioners or physician assistants that have been trained to treat common illnesses and complaints. They're usually fairly quick and inexpensive. However, if you have serious medical issues or chronic medical problems, these are probably not your best option.  No Primary Care Doctor: - Call Health Connect at  [phone] - they can help you locate a primary care doctor that  accepts your insurance, provides certain services, etc. - Physician Referral Service- [phone]  Chronic Pain Problems: Organization         Address  Phone   Notes  [facility]  [phone] Patients need to be referred by their primary care doctor.   Medication  Assistance: Organization         Address  Phone   Notes  [facility] Medication [facility] [address] [place], [place] [address] [phone] --Must be a resident of [facility] -- Must have NO insurance coverage whatsoever (no Medicaid/ Medicare, etc.) -- The pt. MUST have a primary care doctor that directs their care regularly and follows them in the community   [facility]  [phone]   [employer]  [phone]    Agencies that provide inexpensive medical care: Organization         Address  Phone   Notes  [place]  [phone]   [facility]    [phone]   [facility] [place],  [address] [phone]   [place] [address], [place] [phone]   Planned Parenthood    [phone]   [facility]    [phone]   [place] and [place] [address], [place] Phone:  [phone], Fax:  [phone] Hours of Operation:  9 am - 6 pm, M-F.  Also accepts Medicaid/Medicare and self-pay.  [facility] for  Children  [address], [place] Phone: [phone], Fax: [phone]. Hours of Operation:  8:30 am - 5:30 pm, M-F.  Also accepts Medicaid and self-pay.  [facility] [address] [place] Phone: [phone]   [place], [place], [place] [phone], Ext. [phone] Mondays & Thursdays: 7-9 AM.  First 15 patients are seen on a first come, first serve basis.    [place] Providers:  Organization         Address  Phone   Notes  [facility] [address], [place] [phone] Also accepts self-pay patients.  [facility] [address], [place]  [phone]   [place], [address], [place]  [phone]   [facility] [address] [place] [phone]   Lucianne Lei [address], [place]   [phone] Only accepts [place] Access [place] patients after they have their name applied to their card.   Self-Pay (no insurance) in [facility]:  Organization         Address  Phone   Notes  Sickle Cell Patients, [facility] Internal Medicine [place] [phone]   [facility] [place] [phone]   [facility] [place]  [place], [place], [place] [phone]   [facility]/Dr. Osei-Bonsu  [address] [place] or [address], [place] [phone] Phone number for both [place] and [place] locations is the same.  Urgent Medical and [facility] [address] [place] [phone]   [facility] [address], [place] or [address] [phone]   [facility] [address] [place] [phone], phone; [phone], fax Sees patients 1st and 3rd Saturday of every month.  Must not qualify for public or private insurance (i.e. Medicaid, Medicare, [place] Health Choice, Veterans' Benefits)  Household income should be no more than 200% of the poverty level The clinic cannot treat you if you are pregnant or think you are pregnant  Sexually transmitted diseases are not treated at the clinic.    Dental Care: Organization         Address  Phone  Notes  [facility] Department of [facility] [place] [phone] Accepts children up to age 14 who are enrolled in [place] or [place]; pregnant women with a Medicaid card; and children who have applied for Medicaid or [place] Health Choice, but were declined, whose parents can pay a reduced fee at time of service.  [facility]  Department of [facility]  [address], [place] [phone] Accepts children up to age 71 who are enrolled in [place] or [place]; pregnant women with a Medicaid card; and children who have applied for Medicaid or [place] Health Choice, but were declined, whose parents can pay a reduced fee at time of service.  [facility] Adult [facility] Access PROGRAM  [place] [phone] Patients are seen by appointment only. Walk-ins are not accepted. [place] will see patients 60 years of age and older. Monday - Tuesday (8am-5pm) Most Wednesdays (8:30-5pm) $30 per visit, cash only  [place] Adult [facility] Access [facility]  [address], [facility] [phone] Patients are seen by appointment only. Walk-ins are not accepted. [place] will see patients 46 years of age and older. One Wednesday Evening (Monthly: Volunteer Based).  $30 per visit, cash only  [place]  [phone] for adults; Children under age 71, call Graduate Pediatric Dentistry at [phone]. Children aged 62-14, please call [phone] to request a pediatric application.  Dental services are provided in all areas of dental care including fillings, crowns and bridges, complete and partial dentures, implants, gum treatment, root canals, and extractions. Preventive care is also provided. Treatment is provided to both adults and children. Patients are selected via a lottery and there is often a waiting list.   [facility] [address], [place]  [phone] [email or web address]   [facility] [address] [place], [place] [phone], Ext. [phone] Second and Fourth Thursday of each month, opens at 6:30 AM; Clinic ends at 9 AM.  Patients are seen on a first-come first-served basis, and a limited number are seen during each clinic.   [facility]  [address] Hillard Danker [place], [place] [phone]    Eligibility Requirements You must have lived in [place], [place], or [place] counties for at least the last three months.   You cannot be eligible for state or federal sponsored [employer], including [employer], [place], or [employer].   You generally cannot be eligible for healthcare insurance through your employer.    How to apply: Eligibility screenings are held every Tuesday and Wednesday afternoon from 1:00 pm until 4:00 pm. You do not need an appointment for the interview!  [facility] [address] [place], [place]   [place]  [place] Department  [place]  [phone]    Behavioral Health Resources in the Community: Intensive Outpatient Programs Organization         Address  Phone  Notes  [place]. [address] [place], [place] [phone]   [facility] Outpatient [address], [place], [place]   ADS: Alcohol [facility] Drug S[facility] [address], [place], [place]   [place] [address]  [place], [place] or [phone]   Substance Abuse Resources Organization         Address  Phone  Notes  Alcohol and Drug Services  [phone]   [place]  [phone]   [facility] [place]   Chinita Pester  [phone]   Residential & Outpatient Substance Abuse Program  [phone]   Psychological Services Organization         Address  Phone  Notes  [facility] [place]  [place]  [phone]   [place] [address], [place] [phone] or [phone]    Mobile Crisis Teams Organization         Address  Phone  Notes  [facility], Mobile Crisis Care Unit  [phone]   [facility]  [address]  [place], [place]   [place] [address], [place] [phone]    Self-Help/Support Groups Organization         Address  Phone             Notes  [facility] [place] - variety of support groups  [place] Call for more information  Narcotics Anonymous (NA), [facility] [address], [employer] [place]  2 meetings at this location   Special educational needs teacher         Address  Phone  Notes  [facility] [place],    [place]  [phone]   [facility]  [address], [place] [id number], [place], [place]   [place], [place] [phone] Admissions: 8am-3pm M-F  Incentives Substance [place] [address]    [place], [place] [phone]   [facility] [address] [place], [place], [place]   [facility] [address],  [place], [place]   [facility] - Intensive Outpatient [address] Kristeen [address], [place], [place]   [facility] [place].,  [place], [place] [phone] or [phone]   [facility] Services (RTS) [address] [place], [place] Accepts Medicaid  [facility] [place] [address],  [place] [phone] Substance Abuse/Addiction Treatment   [facility] Organization         Address  Phone  Notes  [facility]  [phone]   Domenic Schwab, PhD [address] Arlis Porta [place], [place]   [phone] or [phone]   [place], [place] [phone]   [facility] [address] [place], [place] [phone] Insurance/Medicaid/sponsorship through [facility] and Families [address]                                    [place], [place] [phone] [place] [address], [place] [phone]    Dr. Adele Schilder  [phone]   [facility] Dept. 1) [address], [place] 2) [place] 3)  [place] [address], [place] [phone]  [phone]   [place] [phone] or [phone] (After Hours)

## 2014-10-24 NOTE — ED Provider Notes (Signed)
ED ECG REPORT   Date: 10/24/2014  Rate: 58  Rhythm: sinus bradycardia  QRS Axis: normal  Intervals: normal  ST/T Wave abnormalities: normal  Conduction Disutrbances:none  Narrative Interpretation:   Old EKG Reviewed: none available  I have personally reviewed the EKG tracing and agree with the computerized printout as noted.  Medical screening examination/treatment/procedure(s) were performed by non-physician practitioner and as supervising physician I was immediately available for consultation/collaboration.   EKG Interpretation None        Ernestina Patches, MD 10/25/14 [phone]

## 2014-10-24 NOTE — ED Provider Notes (Signed)
CSN: [id number]     Arrival date & time 10/24/14  1315 History   This chart was scribed for non-physician practitioner, Jamse Mead, PA-C, working with Daleen Bo, MD, by Chester Holstein, ED Scribe. This patient was seen in room TR11C/TR11C and the patient's care was started at 2:39 PM.     Chief Complaint  Patient presents with  . Anemia     The history is provided by the patient. No language interpreter was used.   HPI Comments: Katie Powell is a 24 y.o. female with PMHx of anemia who presents to the Emergency Department complaining of SOB with onset 2 nights ago. Pt reports lying down makes SOB worse. She notes associated fatigue, central chest tightness. Pt was last seen in ED on 10/01/14 and was d/c with a dx of chronic iron deficiency anemia. Pt was told she may need a transfusion at that time, but denied admission. Pt was instructed to take ferrous sulfate 325 mg q.d. She was seen at [facility] on 10/09/14 and iron supplement was increased to b.i.d. She states she ran out of the supplement 3 days ago. Pt denies bcp use and recent prolonged travel. Pt states LNMP was 10/01/14. Pt is unsure of FHx as she is adopted. Patient reports that her menstrual cycles are heavy, reports that she gets them at least 2 times per month sometimes lasting anywhere from 6-7 days. Pt denies fever, chills, cough, chest pain, difficulty breathing, wheezing, hemoptysis, leg swelling, nausea, vomiting, diarrhea, abdominal pain, hematochezia, melena, difficulty urinating, dysuria, hematuria, blurred vision, headache, LOC, numbness, tingling, loss of sensation, neck pain and neck stiffness. PCP none.  Past Medical History  Diagnosis Date  . Anemia    Past Surgical History  Procedure Laterality Date  . Finger surgery     History reviewed. No pertinent family history. History  Substance Use Topics  . Smoking status: Current Every Day Smoker    Types: Cigarettes  . Smokeless tobacco: Not  on file  . Alcohol Use: No   OB History    No data available     Review of Systems  Constitutional: Positive for fatigue. Negative for fever and chills.  Eyes: Negative for visual disturbance.  Respiratory: Positive for chest tightness and shortness of breath. Negative for cough and wheezing.   Cardiovascular: Negative for chest pain and leg swelling.  Gastrointestinal: Negative for nausea, vomiting, abdominal pain, diarrhea and blood in stool.  Genitourinary: Negative for dysuria, hematuria and difficulty urinating.  Musculoskeletal: Negative for neck pain and neck stiffness.  Neurological: Negative for dizziness, syncope, weakness, numbness and headaches.      Allergies  Review of patient's allergies indicates no known allergies.  Home Medications   Prior to Admission medications   Medication Sig Start Date End Date Taking? Authorizing Provider  acetaminophen (TYLENOL) 500 MG tablet Take 1,000 mg by mouth every 6 (six) hours as needed for mild pain, moderate pain, fever or headache.    Historical Provider, MD  ferrous sulfate 325 (65 FE) MG tablet Take 1 tablet (325 mg total) by mouth 2 (two) times daily with a meal. 10/24/14   Clark Clowdus, PA-C   BP 124/79 mmHg  Pulse 67  Temp(Src) 98.9 F (37.2 C) (Oral)  Resp 18  SpO2 100%  LMP 10/01/2014 Physical Exam  Constitutional: She is oriented to person, place, and time. She appears well-developed and well-nourished. No distress.  HENT:  Head: Normocephalic and atraumatic.  Mouth/Throat: Oropharynx is clear and moist. No  oropharyngeal exudate.  Eyes: Conjunctivae and EOM are normal. Pupils are equal, round, and reactive to light. Right eye exhibits no discharge. Left eye exhibits no discharge.  Neck: Normal range of motion. Neck supple.  Cardiovascular: Normal rate, regular rhythm and normal heart sounds.   Pulses:      Radial pulses are 2+ on the right side, and 2+ on the left side.  Pulmonary/Chest: Effort normal and  breath sounds normal. No respiratory distress. She has no wheezes. She has no rales.  Genitourinary:  Rectal exam: Negative swelling, erythema, inflammation, lesions, sores, deformities, areas of induration, fluctuance, hemorrhoid identified to the anus. Negative bright red blood per rectum. Black tarry stool noted on glove-patient currently taking iron in the past. Strong sphincter tone. Exam chaperoned with tech  Musculoskeletal: Normal range of motion.  Neurological: She is alert and oriented to person, place, and time. No cranial nerve deficit. She exhibits normal muscle tone. Coordination normal. GCS eye subscore is 4. GCS verbal subscore is 5. GCS motor subscore is 6.  Skin: Skin is warm and dry. No rash noted. She is not diaphoretic. No erythema. No pallor.  Psychiatric: She has a normal mood and affect. Her behavior is normal. Thought content normal.  Nursing note and vitals reviewed.   ED Course  Procedures (including critical care time)  Results for orders placed or performed during the hospital encounter of 10/24/14  Urinalysis, Routine w reflex microscopic (not at [facility])  Result Value Ref Range   Color, Urine YELLOW YELLOW   APPearance CLOUDY (A) CLEAR   Specific Gravity, Urine 1.018 1.005 - 1.030   pH 6.5 5.0 - 8.0   Glucose, UA NEGATIVE NEGATIVE mg/dL   Hgb urine dipstick NEGATIVE NEGATIVE   Bilirubin Urine NEGATIVE NEGATIVE   Ketones, ur NEGATIVE NEGATIVE mg/dL   Protein, ur NEGATIVE NEGATIVE mg/dL   Urobilinogen, UA 1.0 0.0 - 1.0 mg/dL   Nitrite NEGATIVE NEGATIVE   Leukocytes, UA LARGE (A) NEGATIVE  hCG, quantitative, pregnancy  Result Value Ref Range   hCG, Beta Chain, Quant, S <1 <5 mIU/mL  Troponin I  Result Value Ref Range   Troponin I <0.03 <0.031 ng/mL  Comprehensive metabolic panel  Result Value Ref Range   Sodium 136 135 - 145 mmol/L   Potassium 4.0 3.5 - 5.1 mmol/L   Chloride 106 101 - 111 mmol/L   CO2 23 22 - 32 mmol/L   Glucose, Bld 77 65 - 99 mg/dL    BUN 11 6 - 20 mg/dL   Creatinine, Ser 0.71 0.44 - 1.00 mg/dL   Calcium 9.0 8.9 - 10.3 mg/dL   Total Protein 7.4 6.5 - 8.1 g/dL   Albumin 3.7 3.5 - 5.0 g/dL   AST 21 15 - 41 U/L   ALT 11 (L) 14 - 54 U/L   Alkaline Phosphatase 52 38 - 126 U/L   Total Bilirubin 0.9 0.3 - 1.2 mg/dL   GFR calc non Af Amer >60 >60 mL/min   GFR calc Af Amer >60 >60 mL/min   Anion gap 7 5 - 15  Urine microscopic-add on  Result Value Ref Range   Squamous Epithelial / LPF MANY (A) RARE   WBC, UA 7-10 <3 WBC/hpf   Bacteria, UA FEW (A) RARE  Occult blood card to lab, stool  Result Value Ref Range   Fecal Occult Bld NEGATIVE NEGATIVE  CBC with Differential  Result Value Ref Range   WBC 4.6 4.0 - 10.5 K/uL   RBC 4.45 3.87 -  5.11 MIL/uL   Hemoglobin 9.0 (L) 12.0 - 15.0 g/dL   HCT 30.8 (L) 36.0 - 46.0 %   MCV 69.2 (L) 78.0 - 100.0 fL   MCH 20.2 (L) 26.0 - 34.0 pg   MCHC 29.2 (L) 30.0 - 36.0 g/dL   RDW 30.8 (H) 11.5 - 15.5 %   Platelets 272 150 - 400 K/uL   Neutrophils Relative % 49 43 - 77 %   Lymphocytes Relative 36 12 - 46 %   Monocytes Relative 12 3 - 12 %   Eosinophils Relative 3 0 - 5 %   Basophils Relative 0 0 - 1 %   Neutro Abs 2.2 1.7 - 7.7 K/uL   Lymphs Abs 1.7 0.7 - 4.0 K/uL   Monocytes Absolute 0.6 0.1 - 1.0 K/uL   Eosinophils Absolute 0.1 0.0 - 0.7 K/uL   Basophils Absolute 0.0 0.0 - 0.1 K/uL   RBC Morphology BURR CELLS    Smear Review LARGE PLATELETS PRESENT   Troponin I  Result Value Ref Range   Troponin I <0.03 <0.031 ng/mL  I-Stat Beta hCG blood, ED ([facility], [facility], [facility] only)  Result Value Ref Range   I-stat hCG, quantitative 6.1 (H) <5 mIU/mL   Comment 3          POC urine preg, ED (not at [facility])  Result Value Ref Range   Preg Test, Ur NEGATIVE NEGATIVE  Sample to Blood Bank  Result Value Ref Range   Blood Bank Specimen SAMPLE AVAILABLE FOR TESTING    Sample Expiration 10/25/2014     Labs Review Labs Reviewed  URINALYSIS, ROUTINE W REFLEX MICROSCOPIC (NOT AT [facility]) - Abnormal;  Notable for the following:    APPearance CLOUDY (*)    Leukocytes, UA LARGE (*)    All other components within normal limits  COMPREHENSIVE METABOLIC PANEL - Abnormal; Notable for the following:    ALT 11 (*)    All other components within normal limits  URINE MICROSCOPIC-ADD ON - Abnormal; Notable for the following:    Squamous Epithelial / LPF MANY (*)    Bacteria, UA FEW (*)    All other components within normal limits  CBC WITH DIFFERENTIAL/PLATELET - Abnormal; Notable for the following:    Hemoglobin 9.0 (*)    HCT 30.8 (*)    MCV 69.2 (*)    MCH 20.2 (*)    MCHC 29.2 (*)    RDW 30.8 (*)    All other components within normal limits  I-STAT BETA HCG BLOOD, ED ([facility], [facility], [facility] ONLY) - Abnormal; Notable for the following:    I-stat hCG, quantitative 6.1 (*)    All other components within normal limits  URINE CULTURE  HCG, QUANTITATIVE, PREGNANCY  TROPONIN I  OCCULT BLOOD X 1 CARD TO LAB, STOOL  TROPONIN I  CBC WITH DIFFERENTIAL/PLATELET  POC OCCULT BLOOD, ED  POC URINE PREG, ED  SAMPLE TO BLOOD BANK    Imaging Review Dg Chest 2 View  10/24/2014   CLINICAL DATA:  Anemia, shortness of breath  EXAM: CHEST  2 VIEW  COMPARISON:  09/12/2012  FINDINGS: The heart size and mediastinal contours are within normal limits. Both lungs are clear. The visualized skeletal structures are unremarkable.  IMPRESSION: No active cardiopulmonary disease.   Electronically Signed   By: Kathreen Devoid   On: 10/24/2014 13:59     EKG Interpretation None      5:54 PM Patient ambulated in the ED without difficulty. Heart rate 78 bpm, pulse  ox 100% on room air. Patient denied chest pain, shortness of breath, difficulty breathing, dizziness, visual changes. Patient walked well without any signs of respiratory distress. This provider had a long conversation with the patient regarding lab results. Patient denied history of PE or DVT, history of blood clots, recent surgery, recent hospitalization, recent travel,  leg swelling, hemoptysis, birth control or ever using birth control in her lifetime.  MDM   Final diagnoses:  Anemia, unspecified anemia type  Iron deficiency    Medications - No data to display  Filed Vitals:   10/24/14 1329 10/24/14 1801  BP: 124/79   Pulse: 67   Temp: 98.9 F (37.2 C)   TempSrc: Oral   Resp: 18   SpO2: 100% 100%   I personally performed the services described in this documentation, which was scribed in my presence. The recorded information has been reviewed and is accurate.   This provider reviewed patient's chart. Patient was seen and assessed in the ED setting on 10/01/2014 regarding vaginal complaints she was diagnosed with anemia with a hemoglobin of 6.7. Patient started on iron supplementation. Patient was seen and assessed at [facility] where she was started on iron 2 times per day along with orange juice. EKG noted sinus bradycardia with a heart rate of 50 bpm. Troponin negative elevation. Second troponin not elevated. CBC negative elevated leukocytosis. Hemoglobin 9.0, hematocrit 30.8 - when compared to 3 weeks ago patient's hemoglobin was 6.7, 2 weeks ago was 7.4, has improved. CMP unremarkable-negative signs of end organ damage. Beta-hCG is less than 1-doubt pregnancy. Urine pregnancy is negative. Urinalysis noted-negative hemoglobin, nitrites, large leukocytes identified but many squamous cells few bacteria and white blood cell count 7-10. Urine culture pending. Fecal occult negative. Chest x-ray unremarkable. PERC negative - doubt PE. Fecal occult negative, hemoglobin 9.0-hemoglobin has improved when compared to previous labs. Doubt lower GI bleed or upper GI bleed. Negative findings of pneumonia. Suspicion of symptoms to be associated with anemia, new diagnosis since May 2016. Patient is currently asymptomatic while in ED setting-denied chest pain, chest tightness, shortness of breath or difficulty breathing. Patient is currently  responding well to iron supplementation. Patient stable, afebrile. Patient not septic appearing. Negative signs of respiratory distress. Discharged patient. Discussed with patient to rest and stay hydrated. Discharge patient with iron supplementation. Discussed with patient to monitor closely and follow-up primary care provider to be reassessed within the next 48 hours for blood work to be performed. Discussed with patient to closely monitor symptoms and if symptoms are to worsen or change to report back to the ED - strict return instructions given.  Patient agreed to plan of care, understood, all questions answered.   Jamse Mead, PA-C 10/24/14 2005  8:23 PM This provider called the patient reporting that she did not sign out. Patient reported that she had to go. Patient reported that she has her prescriptions. Patient verbalized and read back her prescription to this provider, it was correct-patient reported that her name is on the prescription, verbalized the name correctly.  Jamse Mead, PA-C 10/24/14 2023  Jamse Mead, PA-C 10/25/14 0015  Daleen Bo, MD 10/25/14 [phone]

## 2014-10-24 NOTE — ED Notes (Signed)
Pt sts hx of anemia and out of iron; pt sts some SOB due to anemia

## 2014-10-26 LAB — URINE CULTURE: Special Requests: NORMAL

## 2014-11-06 ENCOUNTER — Ambulatory Visit: Payer: Medicaid Other | Admitting: Internal Medicine

## 2014-11-12 ENCOUNTER — Telehealth (HOSPITAL_COMMUNITY): Payer: Self-pay

## 2014-11-12 NOTE — ED Notes (Signed)
Unable to contact pt by mail or telephone. Unable to communicate lab results or treatment changes. 

## 2016-08-09 ENCOUNTER — Emergency Department (HOSPITAL_COMMUNITY)
Admission: EM | Admit: 2016-08-09 | Discharge: 2016-08-09 | Disposition: A | Discharge status: Home / Self Care | Payer: Medicaid Other | Attending: Emergency Medicine | Admitting: Emergency Medicine

## 2016-08-09 ENCOUNTER — Encounter (HOSPITAL_COMMUNITY): Payer: Self-pay

## 2016-08-09 DIAGNOSIS — F17210 Nicotine dependence, cigarettes, uncomplicated: Secondary | ICD-10-CM | POA: Diagnosis not present

## 2016-08-09 DIAGNOSIS — J301 Allergic rhinitis due to pollen: Secondary | ICD-10-CM | POA: Diagnosis not present

## 2016-08-09 DIAGNOSIS — R0981 Nasal congestion: Secondary | ICD-10-CM

## 2016-08-09 MED ORDER — LORATADINE 10 MG PO TABS: 10.0000 mg | ORAL_TABLET | Freq: Every day | ORAL | 0 refills | Status: DC

## 2016-08-09 MED ORDER — SALINE SPRAY 0.65 % NA SOLN
1.0000 | NASAL | 0 refills | Status: DC | PRN
Start: 2016-08-09 — End: 2016-08-20

## 2016-08-09 MED ORDER — FLUTICASONE PROPIONATE 50 MCG/ACT NA SUSP: 2.0000 | Freq: Every day | NASAL | 0 refills | Status: DC

## 2016-08-09 NOTE — ED Triage Notes (Signed)
Pt complaining of nasal congestion and cough. Pt states coughing up green sputum. Pt also complaining of generalized fatigue.

## 2016-08-09 NOTE — ED Provider Notes (Signed)
[facility] DEPT Provider Note   CSN: [id number] Arrival date & time: 08/09/16  0112  By signing my name below, I, Oleh Genin, attest that this documentation has been prepared under the direction and in the presence of Merryl Hacker, MD. Electronically Signed: Oleh Genin, Scribe. 08/09/16. 2:22 AM.   History   Chief Complaint Chief Complaint  Patient presents with  . Cough  . Nasal Congestion    HPI Katie Powell is a 26 y.o. female without chronic medical problems who presents to the ED for evaluation of nasal congestion. This patient states that in the last 3 days she has experienced nasal congestion, mild dyspnea, mild sore throat, and non-productive cough. No chest pain. No nausea or vomiting. No fevers. She has not taken anything for her symptoms. Does have a history of seasonal allergies.   The history is provided by the patient. No language interpreter was used.    Past Medical History:  Diagnosis Date  . Anemia     There are no active problems to display for this patient.   Past Surgical History:  Procedure Laterality Date  . FINGER SURGERY      OB History    No data available       Home Medications    Prior to Admission medications   Medication Sig Start Date End Date Taking? Authorizing Provider  acetaminophen (TYLENOL) 500 MG tablet Take 1,000 mg by mouth every 6 (six) hours as needed for mild pain, moderate pain, fever or headache.    Historical Provider, MD  ferrous sulfate 325 (65 FE) MG tablet Take 1 tablet (325 mg total) by mouth 2 (two) times daily with a meal. 10/24/14   Marissa Sciacca, PA-C  fluticasone (FLONASE) 50 MCG/ACT nasal spray Place 2 sprays into both nostrils daily. 08/09/16   Merryl Hacker, MD  loratadine (CLARITIN) 10 MG tablet Take 1 tablet (10 mg total) by mouth daily. 08/09/16   Merryl Hacker, MD  sodium chloride ([name]) 0.65 % SOLN nasal spray Place 1 spray into both nostrils as needed for congestion. 08/09/16    Merryl Hacker, MD    Family History History reviewed. No pertinent family history.  Social History Social History  Substance Use Topics  . Smoking status: Current Every Day Smoker    Types: Cigarettes  . Smokeless tobacco: Never Used  . Alcohol use No     Allergies   Patient has no known allergies.   Review of Systems Review of Systems  Constitutional: Negative for fever.  HENT: Positive for congestion and sore throat.   Respiratory: Positive for cough and shortness of breath.   Cardiovascular: Negative for chest pain.  Gastrointestinal: Negative for nausea and vomiting.  All other systems reviewed and are negative.    Physical Exam Updated Vital Signs BP 111/74 (BP Location: Right Arm)   Pulse 70   Temp 99.4 F (37.4 C) (Oral)   Resp 14   Ht 5\' 6"  (1.676 m)   Wt 152 lb (68.9 kg)   LMP 07/15/2016 (Approximate)   SpO2 100%   BMI 24.53 kg/m   Physical Exam  Constitutional: She is oriented to person, place, and time. She appears well-developed and well-nourished.  HENT:  Head: Normocephalic and atraumatic.  Postnasal drip noted, swelling of the bilateral nasal turbinates  Eyes: Pupils are equal, round, and reactive to light.  Neck: Normal range of motion. Neck supple.  Cardiovascular: Normal rate, regular rhythm and normal heart sounds.   Pulmonary/Chest: Effort  normal and breath sounds normal. No respiratory distress. She has no wheezes.  Lymphadenopathy:    She has no cervical adenopathy.  Neurological: She is alert and oriented to person, place, and time.  Skin: Skin is warm and dry.  Psychiatric: She has a normal mood and affect.  Nursing note and vitals reviewed.    ED Treatments / Results  Labs (all labs ordered are listed, but only abnormal results are displayed) Labs Reviewed - No data to display  EKG  EKG Interpretation None       Radiology No results found.  Procedures Procedures (including critical care time)  Medications  Ordered in ED Medications - No data to display   Initial Impression / Assessment and Plan / ED Course  I have reviewed the triage vital signs and the nursing notes.  Pertinent labs & imaging results that were available during my care of the patient were reviewed by me and considered in my medical decision making (see chart for details).     Patient presents with nasal congestion, mild sore throat, shortness of breath. Nontoxic. Afebrile. Satting 100% on room air. Symptoms most suggestive of allergic rhinitis especially given physical exam. Pulmonary exam is normal. Doubt pneumonia or infectious etiology. Recommend nasal saline, Flonase, and daily allergy medication.  After history, exam, and medical workup I feel the patient has been appropriately medically screened and is safe for discharge home. Pertinent diagnoses were discussed with the patient. Patient was given return precautions.   Final Clinical Impressions(s) / ED Diagnoses   Final diagnoses:  Nasal congestion  Acute seasonal allergic rhinitis due to pollen    New Prescriptions New Prescriptions   FLUTICASONE (FLONASE) 50 MCG/ACT NASAL SPRAY    Place 2 sprays into both nostrils daily.   LORATADINE (CLARITIN) 10 MG TABLET    Take 1 tablet (10 mg total) by mouth daily.   SODIUM CHLORIDE ([name]) 0.65 % SOLN NASAL SPRAY    Place 1 spray into both nostrils as needed for congestion.   I personally performed the services described in this documentation, which was scribed in my presence. The recorded information has been reviewed and is accurate.    Merryl Hacker, MD 08/09/16 [phone]

## 2016-08-20 ENCOUNTER — Observation Stay (HOSPITAL_COMMUNITY): Payer: Medicaid Other

## 2016-08-20 ENCOUNTER — Encounter (HOSPITAL_COMMUNITY): Payer: Self-pay | Admitting: Emergency Medicine

## 2016-08-20 ENCOUNTER — Observation Stay (HOSPITAL_COMMUNITY)
Admission: EM | Admit: 2016-08-20 | Discharge: 2016-08-20 | Disposition: A | Discharge status: Home / Self Care | Payer: Medicaid Other | Attending: Internal Medicine | Admitting: Internal Medicine

## 2016-08-20 DIAGNOSIS — R0602 Shortness of breath: Secondary | ICD-10-CM | POA: Insufficient documentation

## 2016-08-20 DIAGNOSIS — D500 Iron deficiency anemia secondary to blood loss (chronic): Secondary | ICD-10-CM | POA: Diagnosis not present

## 2016-08-20 DIAGNOSIS — Z79899 Other long term (current) drug therapy: Secondary | ICD-10-CM | POA: Diagnosis not present

## 2016-08-20 DIAGNOSIS — Z3201 Encounter for pregnancy test, result positive: Secondary | ICD-10-CM | POA: Diagnosis not present

## 2016-08-20 DIAGNOSIS — F17210 Nicotine dependence, cigarettes, uncomplicated: Secondary | ICD-10-CM | POA: Insufficient documentation

## 2016-08-20 DIAGNOSIS — D649 Anemia, unspecified: Secondary | ICD-10-CM | POA: Diagnosis not present

## 2016-08-20 DIAGNOSIS — D75839 Thrombocytosis, unspecified: Secondary | ICD-10-CM | POA: Diagnosis present

## 2016-08-20 DIAGNOSIS — D473 Essential (hemorrhagic) thrombocythemia: Secondary | ICD-10-CM | POA: Diagnosis not present

## 2016-08-20 LAB — PREPARE RBC (CROSSMATCH)

## 2016-08-20 LAB — RETICULOCYTES
RBC.: 3.3 MIL/uL — ABNORMAL LOW (ref 3.87–5.11)
RETIC COUNT ABSOLUTE: 29.7 10*3/uL (ref 19.0–186.0)
Retic Ct Pct: 0.9 % (ref 0.4–3.1)

## 2016-08-20 LAB — URINALYSIS, ROUTINE W REFLEX MICROSCOPIC
BILIRUBIN URINE: NEGATIVE
GLUCOSE, UA: NEGATIVE mg/dL
HGB URINE DIPSTICK: NEGATIVE
KETONES UR: NEGATIVE mg/dL
NITRITE: NEGATIVE
PROTEIN: NEGATIVE mg/dL
Specific Gravity, Urine: 1.026 (ref 1.005–1.030)
pH: 5 (ref 5.0–8.0)

## 2016-08-20 LAB — CBC
HEMATOCRIT: 23 % — AB (ref 36.0–46.0)
Hemoglobin: 6.2 g/dL — CL (ref 12.0–15.0)
MCH: 15.9 pg — AB (ref 26.0–34.0)
MCHC: 27 g/dL — AB (ref 30.0–36.0)
MCV: 59 fL — ABNORMAL LOW (ref 78.0–100.0)
Platelets: 442 10*3/uL — ABNORMAL HIGH (ref 150–400)
RBC: 3.9 MIL/uL (ref 3.87–5.11)
RDW: 21.5 % — ABNORMAL HIGH (ref 11.5–15.5)
WBC: 6.2 10*3/uL (ref 4.0–10.5)

## 2016-08-20 LAB — BASIC METABOLIC PANEL
Anion gap: 7 (ref 5–15)
BUN: 18 mg/dL (ref 6–20)
CO2: 21 mmol/L — ABNORMAL LOW (ref 22–32)
Calcium: 9.1 mg/dL (ref 8.9–10.3)
Chloride: 108 mmol/L (ref 101–111)
Creatinine, Ser: 0.61 mg/dL (ref 0.44–1.00)
GFR calc Af Amer: >60 mL/min (ref >60)
GFR calc non Af Amer: >60 mL/min (ref >60)
Glucose, Bld: 85 mg/dL (ref 65–99)
Potassium: 3.7 mmol/L (ref 3.5–5.1)
SODIUM: 136 mmol/L (ref 135–145)

## 2016-08-20 LAB — IRON AND TIBC
IRON: 7 ug/dL — AB (ref 28–170)
Saturation Ratios: 2 % — ABNORMAL LOW (ref 10.4–31.8)
TIBC: 402 ug/dL (ref 250–450)
UIBC: 395 ug/dL

## 2016-08-20 LAB — FOLATE: Folate: 18.1 ng/mL (ref >5.9)

## 2016-08-20 LAB — I-STAT BETA HCG BLOOD, ED (MC, WL, AP ONLY): HCG, QUANTITATIVE: 126.5 m[IU]/mL — AB (ref <5)

## 2016-08-20 LAB — FERRITIN: Ferritin: 1 ng/mL — ABNORMAL LOW (ref 11–307)

## 2016-08-20 LAB — VITAMIN B12: Vitamin B-12: 449 pg/mL (ref 180–914)

## 2016-08-20 MED ORDER — FOLIC ACID 1 MG PO TABS: 1.0000 mg | ORAL_TABLET | Freq: Every day | ORAL | Status: DC

## 2016-08-20 MED ORDER — FERROUS SULFATE 325 (65 FE) MG PO TABS
325.0000 mg | ORAL_TABLET | Freq: Two times a day (BID) | ORAL | Status: DC
  Administered 2016-08-20: 325 mg via ORAL
  Filled 2016-08-20: qty 1

## 2016-08-20 MED ORDER — ONDANSETRON HCL 4 MG PO TABS: 4.0000 mg | ORAL_TABLET | Freq: Four times a day (QID) | ORAL | Status: DC | PRN

## 2016-08-20 MED ORDER — ALBUTEROL SULFATE (2.5 MG/3ML) 0.083% IN NEBU: 2.5000 mg | INHALATION_SOLUTION | RESPIRATORY_TRACT | Status: DC | PRN

## 2016-08-20 MED ORDER — ACETAMINOPHEN 650 MG RE SUPP: 650.0000 mg | Freq: Four times a day (QID) | RECTAL | Status: DC | PRN

## 2016-08-20 MED ORDER — ONDANSETRON HCL 4 MG/2ML IJ SOLN: 4.0000 mg | Freq: Four times a day (QID) | INTRAMUSCULAR | Status: DC | PRN

## 2016-08-20 MED ORDER — SODIUM CHLORIDE 0.9 % IV SOLN
Freq: Once | INTRAVENOUS | Status: AC
  Administered 2016-08-20: 05:00:00 via INTRAVENOUS

## 2016-08-20 MED ORDER — SODIUM CHLORIDE 0.9 % IV SOLN
510.0000 mg | Freq: Once | INTRAVENOUS | Status: AC
  Administered 2016-08-20: 510 mg via INTRAVENOUS
  Filled 2016-08-20: qty 17

## 2016-08-20 MED ORDER — ACETAMINOPHEN 325 MG PO TABS: 650.0000 mg | ORAL_TABLET | Freq: Four times a day (QID) | ORAL | Status: DC | PRN

## 2016-08-20 NOTE — H&P (Addendum)
History and Physical    [name]:[id number] DOB: 1990/07/27 DOA: 08/20/2016  Referring MD/NP/PA: Alecia Lemming, PA-C PCP: No PCP Per Patient  Patient coming from: Home   Chief Complaint: Shortness of breath and weakness  HPI: Katie Powell is a 26 y.o. female with medical history significant of iron deficiency anemia; who presents with complaints of shortness of breath and weakness. Patient reports that she's had these symptoms for a few months now, but acutely worsened last night. Reports taking iron tablets only intermittently because she does not like taking pills and they make her constipated. Patient reports that she is expecting her menstrual period sometime this week. Denies having any significant chest pain, leg swelling, recent travel, cough, hemoptysis, use of blood thinners, blood in stool/urine, or dysuria.  ED Course: Upon admission into the emergency department patient was noted to have vital signs are relatively within normal limits. Labs revealed hemoglobin 6.2, platelets 442, quantitative beta-hCG 126. Patient was ordered to be transfuse 2 units of packed red blood cells.  Review of Systems: As per HPI otherwise 10 point review of systems negative.   Past Medical History:  Diagnosis Date  . Anemia     Past Surgical History:  Procedure Laterality Date  . FINGER SURGERY       reports that she has been smoking Cigarettes.  She has been smoking about 0.50 packs per day. She has never used smokeless tobacco. She reports that she does not drink alcohol or use drugs.  No Known Allergies  No family history on file.  Prior to Admission medications   Medication Sig Start Date End Date Taking? Authorizing Provider  acetaminophen (TYLENOL) 500 MG tablet Take 1,000 mg by mouth every 6 (six) hours as needed for mild pain, moderate pain, fever or headache.   Yes Historical Provider, MD  ferrous sulfate 325 (65 FE) MG tablet Take 1 tablet (325 mg total) by mouth 2  (two) times daily with a meal. 10/24/14  Yes Marissa Sciacca, PA-C  fluticasone (FLONASE) 50 MCG/ACT nasal spray Place 2 sprays into both nostrils daily. 08/09/16  Yes Merryl Hacker, MD  loratadine (CLARITIN) 10 MG tablet Take 1 tablet (10 mg total) by mouth daily. Patient not taking: Reported on 08/20/2016 08/09/16   Merryl Hacker, MD  sodium chloride ([name]) 0.65 % SOLN nasal spray Place 1 spray into both nostrils as needed for congestion. Patient not taking: Reported on 08/20/2016 08/09/16   Merryl Hacker, MD    Physical Exam:    Constitutional: NAD, calm, comfortable Vitals:   08/20/16 0138 08/20/16 0152 08/20/16 0252 08/20/16 0419  BP: 131/87  118/79 109/74  Pulse: 75  64 80  Resp: 18  (!) 22 16  Temp: 98.6 F (37 C)   98.4 F (36.9 C)  TempSrc: Oral   Oral  SpO2: 100%  100% 100%  Weight:  68 kg (150 lb)  68 kg (150 lb)  Height:  5\' 6"  (1.676 m)  5\' 6"  (1.676 m)   Eyes: PERRL, lids and conjunctivae normal ENMT: Mucous membranes are moist. Posterior pharynx clear of any exudate or lesions.Normal dentition.  Neck: normal, supple, no masses, no thyromegaly Respiratory: clear to auscultation bilaterally, no wheezing, no crackles. Normal respiratory effort. No accessory muscle use.  Cardiovascular: Regular rate and rhythm, no murmurs / rubs / gallops. No extremity edema. 2+ pedal pulses. No carotid bruits.  Abdomen: no tenderness, no masses palpated. No hepatosplenomegaly. Bowel sounds positive.  Musculoskeletal: no clubbing /  cyanosis. No joint deformity upper and lower extremities. Good ROM, no contractures. Normal muscle tone.  Skin: no rashes, lesions, ulcers. No induration Neurologic: CN 2-12 grossly intact. Sensation intact, DTR normal. Strength 5/5 in all 4.  Psychiatric: Normal judgment and insight. Alert and oriented x 3. Normal mood.     Labs on Admission: I have personally reviewed following labs and imaging studies  CBC:  Recent Labs Lab 08/20/16 0206  WBC  6.2  HGB 6.2*  HCT 23.0*  MCV 59.0*  PLT 009*   Basic Metabolic Panel:  Recent Labs Lab 08/20/16 0206  NA 136  K 3.7  CL 108  CO2 21*  GLUCOSE 85  BUN 18  CREATININE 0.61  CALCIUM 9.1   GFR: Estimated Creatinine Clearance: 102.4 mL/min (by C-G formula based on SCr of 0.61 mg/dL). Liver Function Tests: No results for input(s): AST, ALT, ALKPHOS, BILITOT, PROT, ALBUMIN in the last 168 hours. No results for input(s): LIPASE, AMYLASE in the last 168 hours. No results for input(s): AMMONIA in the last 168 hours. Coagulation Profile: No results for input(s): INR, PROTIME in the last 168 hours. Cardiac Enzymes: No results for input(s): CKTOTAL, CKMB, CKMBINDEX, TROPONINI in the last 168 hours. BNP (last 3 results) No results for input(s): PROBNP in the last 8760 hours. HbA1C: No results for input(s): HGBA1C in the last 72 hours. CBG: No results for input(s): GLUCAP in the last 168 hours. Lipid Profile: No results for input(s): CHOL, HDL, LDLCALC, TRIG, CHOLHDL, LDLDIRECT in the last 72 hours. Thyroid Function Tests: No results for input(s): TSH, T4TOTAL, FREET4, T3FREE, THYROIDAB in the last 72 hours. Anemia Panel: No results for input(s): VITAMINB12, FOLATE, FERRITIN, TIBC, IRON, RETICCTPCT in the last 72 hours. Urine analysis:    Component Value Date/Time   COLORURINE YELLOW 08/20/2016 0155   APPEARANCEUR HAZY (A) 08/20/2016 0155   LABSPEC 1.026 08/20/2016 0155   PHURINE 5.0 08/20/2016 0155   GLUCOSEU NEGATIVE 08/20/2016 0155   HGBUR NEGATIVE 08/20/2016 0155   BILIRUBINUR NEGATIVE 08/20/2016 0155   KETONESUR NEGATIVE 08/20/2016 0155   PROTEINUR NEGATIVE 08/20/2016 0155   UROBILINOGEN 1.0 10/24/2014 1446   NITRITE NEGATIVE 08/20/2016 0155   LEUKOCYTESUR MODERATE (A) 08/20/2016 0155   Sepsis Labs: No results found for this or any previous visit (from the past 240 hour(s)).   Radiological Exams on Admission: No results found.   Assessment/Plan Symptomatic  anemia with H/O of iron deficiency anemia: Acute. Hemoglobin noted to be 6.2 with significantly low MCV and MCH on admission.  - Admit to a telemetry bed - Follow-up anemia panel - Continue with transfusion of 2 units of packed red blood cells - recheck CBC after completion of transfusion - Ferrous sulfate - Stressed need of compliance with iron supplementation  Shortness of breath - Check chest x-ray  Positive pregnancy test: Patient's quantitative beta-hCG elevated to 126 mIU/ml which suggests patient to be 2-[redacted] weeks pregnant. She declines further evaluation at this point, but would like to be referred to OB/GYN. - Needs referral to OB/GYN  Thrombocytosis: Platelet count 442. Suspect likely reactive   DVT prophylaxis: SCDs   Code Status: Full  Family Communication: Discussed plan of care with patient and family present percent  Disposition Plan: Likely discharge home in medically stable  Consults called: none Admission status: Observation  Norval Morton MD [facility] Pager [phone]  If 7PM-7AM, please contact night-coverage [email or web address] Password [email or web address]  08/20/2016, 5:09 AM

## 2016-08-20 NOTE — Progress Notes (Signed)
08/20/16  1500  Reviewed discharge instructions with patient. Patient verbalized understanding of discharge instructions. Copy of discharge instructions given to patient.

## 2016-08-20 NOTE — ED Provider Notes (Signed)
[facility] DEPT Provider Note   CSN: [id number] Arrival date & time: 08/20/16  0133     History   Chief Complaint Chief Complaint  Patient presents with  . Chest Pain  . Loss of Consciousness  . Anemia    HPI Katie Powell is a 26 y.o. female.  Patient with history of chronic iron deficiency anemia presents with c/o worsening generalized weakness, fatigue, shortness of breath with activity. She presents tonight because she awoke from sleep very short of breath. She has a history of syncope but states that she has not had any episodes recently, since at least last year. She denies heavy menstrual periods. No blood in the urine from the gums. No easy bruising. Last menstrual period was approximately one month ago and patient is expecting to start her menstrual period any day. She is sexually active with one partner. She does not use protection. No nausea or vomiting. No lower abdominal pain or cramping, lower back pain. She has never had a blood transfusion before. She has been prescribed iron tablets but these make her constipated and she takes them sporadically. Patient denies risk factors for pulmonary embolism including: unilateral leg swelling, history of DVT/PE/other blood clots, use of exogenous hormones, recent immobilizations, recent surgery, recent travel (>4hr segment), malignancy, hemoptysis. The onset of this condition is chronic. The course is worsening. Aggravating factors: none. Alleviating factors: none.        Past Medical History:  Diagnosis Date  . Anemia     There are no active problems to display for this patient.   Past Surgical History:  Procedure Laterality Date  . FINGER SURGERY      OB History    No data available       Home Medications    Prior to Admission medications   Medication Sig Start Date End Date Taking? Authorizing Provider  acetaminophen (TYLENOL) 500 MG tablet Take 1,000 mg by mouth every 6 (six) hours as needed for mild  pain, moderate pain, fever or headache.   Yes Historical Provider, MD  ferrous sulfate 325 (65 FE) MG tablet Take 1 tablet (325 mg total) by mouth 2 (two) times daily with a meal. 10/24/14  Yes Marissa Sciacca, PA-C  fluticasone (FLONASE) 50 MCG/ACT nasal spray Place 2 sprays into both nostrils daily. 08/09/16  Yes Merryl Hacker, MD  loratadine (CLARITIN) 10 MG tablet Take 1 tablet (10 mg total) by mouth daily. Patient not taking: Reported on 08/20/2016 08/09/16   Merryl Hacker, MD  sodium chloride ([name]) 0.65 % SOLN nasal spray Place 1 spray into both nostrils as needed for congestion. Patient not taking: Reported on 08/20/2016 08/09/16   Merryl Hacker, MD    Family History No family history on file.  Social History Social History  Substance Use Topics  . Smoking status: Current Every Day Smoker    Packs/day: 0.50    Types: Cigarettes  . Smokeless tobacco: Never Used  . Alcohol use No     Allergies   Patient has no known allergies.   Review of Systems Review of Systems  Constitutional: Positive for fatigue. Negative for fever.  HENT: Negative for rhinorrhea and sore throat.   Eyes: Negative for redness.  Respiratory: Positive for shortness of breath. Negative for cough.   Cardiovascular: Negative for chest pain.  Gastrointestinal: Negative for abdominal pain, diarrhea, nausea and vomiting.  Genitourinary: Negative for dysuria and pelvic pain.  Musculoskeletal: Negative for myalgias.  Skin: Negative for rash.  Neurological:  Positive for weakness. Negative for syncope and headaches.     Physical Exam Updated Vital Signs BP 109/74 (BP Location: Left Arm)   Pulse 80   Temp 98.4 F (36.9 C) (Oral)   Resp 16   Ht 5\' 6"  (1.676 m)   Wt 68 kg   SpO2 100%   BMI 24.21 kg/m   Physical Exam  Constitutional: She appears well-developed and well-nourished.  HENT:  Head: Normocephalic and atraumatic.  Mouth/Throat: Oropharynx is clear and moist.  Eyes: Right eye  exhibits no discharge. Left eye exhibits no discharge.  Pale conjunctiva  Neck: Normal range of motion. Neck supple.  Cardiovascular: Normal rate, regular rhythm and normal heart sounds.   Pulmonary/Chest: Effort normal and breath sounds normal.  Abdominal: Soft. There is no tenderness. There is no rebound and no guarding.  Neurological: She is alert.  Skin: Skin is warm and dry.  Psychiatric: She has a normal mood and affect.  Nursing note and vitals reviewed.    ED Treatments / Results  Labs (all labs ordered are listed, but only abnormal results are displayed) Labs Reviewed  BASIC METABOLIC PANEL - Abnormal; Notable for the following:       Result Value   CO2 21 (*)    All other components within normal limits  CBC - Abnormal; Notable for the following:    Hemoglobin 6.2 (*)    HCT 23.0 (*)    MCV 59.0 (*)    MCH 15.9 (*)    MCHC 27.0 (*)    RDW 21.5 (*)    Platelets 442 (*)    All other components within normal limits  URINALYSIS, ROUTINE W REFLEX MICROSCOPIC - Abnormal; Notable for the following:    APPearance HAZY (*)    Leukocytes, UA MODERATE (*)    Bacteria, UA RARE (*)    Squamous Epithelial / LPF 0-5 (*)    All other components within normal limits  I-STAT BETA HCG BLOOD, ED ([facility], [facility], [facility] ONLY) - Abnormal; Notable for the following:    I-stat hCG, quantitative 126.5 (*)    All other components within normal limits  TYPE AND SCREEN  PREPARE RBC (CROSSMATCH)    EKG  EKG Interpretation  Date/Time:  Thursday August 20 2016 01:59:29 EDT Ventricular Rate:  81 PR Interval:    QRS Duration: 67 QT Interval:  398 QTC Calculation: 462 R Axis:   77 Text Interpretation:  Sinus rhythm Probable left atrial enlargement Borderline T abnormalities, anterior leads Rate is faster Confirmed by Florina Ou  MD, Jenny Reichmann ([id number]) on 08/20/2016 2:03:24 AM       Radiology No results found.  Procedures Procedures (including critical care time)  Medications Ordered in  ED Medications - No data to display   Initial Impression / Assessment and Plan / ED Course  I have reviewed the triage vital signs and the nursing notes.  Pertinent labs & imaging results that were available during my care of the patient were reviewed by me and considered in my medical decision making (see chart for details).     Patient seen and examined. Work-up reviewed with patient. Discussed blood transfusion with patient. She agrees to proceed. Will admit.    We also discussed positive pregnancy result.  Vital signs reviewed and are as follows: BP 109/74 (BP Location: Left Arm)   Pulse 80   Temp 98.4 F (36.9 C) (Oral)   Resp 16   Ht 5\' 6"  (1.676 m)   Wt 68 kg   SpO2  100%   BMI 24.21 kg/m   Discussed with Dr. Florina Ou.   Spoke with Dr. Tamala Julian who will see. Requests anemia panel prior to transfusion. I spoke with RN to ensure that this is performed.   Final Clinical Impressions(s) / ED Diagnoses   Final diagnoses:  SOB (shortness of breath)   Admit for symptomatic anemia, hgb < 7.   New Prescriptions New Prescriptions   No medications on file     Carlisle Cater, Hershal Coria 08/20/16 [place], MD 08/20/16 [phone]

## 2016-08-20 NOTE — ED Triage Notes (Signed)
Pt presents stating she often wakes quickly out of her sleep trying to catch her breath and feels like she stops breathing.  States she then starts to panic and her chest gets tight.  Also reports multiple syncopal episodes. Pt endorses dizziness on occasion. A&O x4.  Ambulatory in triage.

## 2016-08-20 NOTE — Discharge Summary (Signed)
Physician Discharge Summary  Katie Powell IWL:[id number] DOB: 10/24/1990 DOA: 08/20/2016  PCP: No PCP Per Patient  Admit date: 08/20/2016 Discharge date: 08/20/2016  Admitted From: Home Disposition: Home  Recommendations for Outpatient Follow-up:  1. Follow up with OB/GYN in 1-2 weeks 2. Please obtain BMP/CBC in one week  Home Health: NA Equipment/Devices:NA  Discharge Condition: Stable CODE STATUS: Full Code Diet recommendation: Diet regular Room service appropriate? Yes; Fluid consistency: Thin Diet - low sodium heart healthy  Brief/Interim Summary: Katie Powell is a 26 y.o. female with medical history significant of iron deficiency anemia; who presents with complaints of shortness of breath and weakness. Patient reports that she's had these symptoms for a few months now, but acutely worsened last night. Reports taking iron tablets only intermittently because she does not like taking pills and they make her constipated. Patient reports that she is expecting her menstrual period sometime this week. Denies having any significant chest pain, leg swelling, recent travel, cough, hemoptysis, use of blood thinners, blood in stool/urine, or dysuria.   Discharge Diagnoses:  Principal Problem:   Symptomatic anemia Active Problems:   Positive pregnancy test   Iron deficiency anemia due to chronic blood loss   Thrombocytosis (HCC)   Iron deficiency anemia, symptomatic -This is acute on chronic, presented with hemoglobin of 6.2. -Anemia panel showed iron of 7, ferritin of 1 consistent with iron deficiency anemia. -Reported she is not taking her oral iron supplements because of GI symptoms with it. -Given 510 mg of Feraheme. -2 units of blood transfused. - Stressed need of compliance with iron supplementation  Shortness of breath - Check chest x-ray, this is likely secondary to anemia  Positive pregnancy test -Patient's quantitative beta-hCG elevated to 126 mIU/ml which  suggests patient to be 2-[redacted] weeks pregnant. She declines further evaluation at this point, but would like to be referred to OB/GYN. -Recommend follow-up with OB/GYN as outpatient. Folic acid added.  Thrombocytosis -Platelet count 442, this is likely secondary to iron deficiency.  Discharge Instructions  Discharge Instructions    Diet - low sodium heart healthy    Complete by:  As directed    Increase activity slowly    Complete by:  As directed      Allergies as of 08/20/2016   No Known Allergies     Medication List    STOP taking these medications   loratadine 10 MG tablet Commonly known as:  CLARITIN   sodium chloride 0.65 % Soln nasal spray Commonly known as:  [name]     TAKE these medications   acetaminophen 500 MG tablet Commonly known as:  TYLENOL Take 1,000 mg by mouth every 6 (six) hours as needed for mild pain, moderate pain, fever or headache.   ferrous sulfate 325 (65 FE) MG tablet Take 1 tablet (325 mg total) by mouth 2 (two) times daily with a meal.   fluticasone 50 MCG/ACT nasal spray Commonly known as:  FLONASE Place 2 sprays into both nostrils daily.   folic acid 1 MG tablet Commonly known as:  FOLVITE Take 1 tablet (1 mg total) by mouth daily.       No Known Allergies  Consultations:  None   Procedures (Echo, Carotid, EGD, Colonoscopy, ERCP)   Radiological studies: Dg Chest Port 1 View  Result Date: 08/20/2016 CLINICAL DATA:  Shortness of breath and chest tightness without other symptoms. The patient has [redacted] weeks pregnant and was shielded. EXAM: PORTABLE CHEST 1 VIEW COMPARISON:  PA and lateral chest  x-ray of October 24, 2014 FINDINGS: The lungs are well-expanded and clear. There is no pleural effusion or pneumothorax. The heart and pulmonary vascularity are normal. The mediastinum is normal in width. The bony thorax exhibits no acute abnormality. IMPRESSION: There is no active cardiopulmonary disease. Electronically Signed   By: David  Martinique  M.D.   On: 08/20/2016 07:15     Subjective:  Discharge Exam: Vitals:   08/20/16 0600 08/20/16 0619 08/20/16 0955 08/20/16 1019  BP: (!) 100/54 109/62 (!) 104/59 108/60  Pulse: 60 67 79 73  Resp: 15 16 20 20   Temp:  99.2 F (37.3 C) 99.4 F (37.4 C) 99.3 F (37.4 C)  TempSrc:  Oral Oral Oral  SpO2: 100% (!) 10% (P) 98% 100%  Weight:  65.6 kg (144 lb 10 oz)    Height:  5\' 6"  (1.676 m)     General: Pt is alert, awake, not in acute distress Cardiovascular: RRR, S1/S2 +, no rubs, no gallops Respiratory: CTA bilaterally, no wheezing, no rhonchi Abdominal: Soft, NT, ND, bowel sounds + Extremities: no edema, no cyanosis   The results of significant diagnostics from this hospitalization (including imaging, microbiology, ancillary and laboratory) are listed below for reference.    Microbiology: No results found for this or any previous visit (from the past 240 hour(s)).   Labs: BNP (last 3 results) No results for input(s): BNP in the last 8760 hours. Basic Metabolic Panel:  Recent Labs Lab 08/20/16 0206  NA 136  K 3.7  CL 108  CO2 21*  GLUCOSE 85  BUN 18  CREATININE 0.61  CALCIUM 9.1   Liver Function Tests: No results for input(s): AST, ALT, ALKPHOS, BILITOT, PROT, ALBUMIN in the last 168 hours. No results for input(s): LIPASE, AMYLASE in the last 168 hours. No results for input(s): AMMONIA in the last 168 hours. CBC:  Recent Labs Lab 08/20/16 0206  WBC 6.2  HGB 6.2*  HCT 23.0*  MCV 59.0*  PLT 442*   Cardiac Enzymes: No results for input(s): CKTOTAL, CKMB, CKMBINDEX, TROPONINI in the last 168 hours. BNP: Invalid input(s): POCBNP CBG: No results for input(s): GLUCAP in the last 168 hours. D-Dimer No results for input(s): DDIMER in the last 72 hours. Hgb A1c No results for input(s): HGBA1C in the last 72 hours. Lipid Profile No results for input(s): CHOL, HDL, LDLCALC, TRIG, CHOLHDL, LDLDIRECT in the last 72 hours. Thyroid function studies No results  for input(s): TSH, T4TOTAL, T3FREE, THYROIDAB in the last 72 hours.  Invalid input(s): FREET3 Anemia work up  Recent Labs  08/20/16 0517  VITAMINB12 449  FOLATE 18.1  FERRITIN 1*  TIBC 402  IRON 7*  RETICCTPCT 0.9   Urinalysis    Component Value Date/Time   COLORURINE YELLOW 08/20/2016 0155   APPEARANCEUR HAZY (A) 08/20/2016 0155   LABSPEC 1.026 08/20/2016 0155   PHURINE 5.0 08/20/2016 0155   GLUCOSEU NEGATIVE 08/20/2016 0155   HGBUR NEGATIVE 08/20/2016 0155   BILIRUBINUR NEGATIVE 08/20/2016 0155   KETONESUR NEGATIVE 08/20/2016 0155   PROTEINUR NEGATIVE 08/20/2016 0155   UROBILINOGEN 1.0 10/24/2014 1446   NITRITE NEGATIVE 08/20/2016 0155   LEUKOCYTESUR MODERATE (A) 08/20/2016 0155   Sepsis Labs Invalid input(s): PROCALCITONIN,  WBC,  LACTICIDVEN Microbiology No results found for this or any previous visit (from the past 240 hour(s)).   Time coordinating discharge: Over 30 minutes  SIGNED:   Birdie Hopes, MD  [facility] 08/20/2016, 10:56 AM Pager   If 7PM-7AM, please contact night-coverage [email or web address] Password [email or web address]

## 2016-08-20 NOTE — Care Management Note (Signed)
Case Management Note  Patient Details  Name: Katie Powell MRN: [id number] Date of Birth: [birth year]  Subjective/Objective: 26 y/o f from home. No CM needs.                   Action/Plan:d/c home.   Expected Discharge Date:  08/20/16               Expected Discharge Plan:  Home/Self Care  In-House Referral:     Discharge planning Services  CM Consult  Post Acute Care Choice:    Choice offered to:     DME Arranged:    DME Agency:     HH Arranged:    HH Agency:     Status of Service:  Completed, signed off  If discussed at [employer] of Stay Meetings, dates discussed:    Additional Comments:  Dessa Phi, RN 08/20/2016, 10:53 AM

## 2016-08-21 ENCOUNTER — Emergency Department (HOSPITAL_COMMUNITY)
Admission: EM | Admit: 2016-08-21 | Discharge: 2016-08-21 | Disposition: A | Discharge status: Home / Self Care | Payer: Medicaid Other | Attending: Emergency Medicine | Admitting: Emergency Medicine

## 2016-08-21 DIAGNOSIS — R1310 Dysphagia, unspecified: Secondary | ICD-10-CM | POA: Diagnosis present

## 2016-08-21 DIAGNOSIS — F17210 Nicotine dependence, cigarettes, uncomplicated: Secondary | ICD-10-CM | POA: Diagnosis not present

## 2016-08-21 LAB — TYPE AND SCREEN
ABO/RH(D): O POS
Antibody Screen: POSITIVE
DAT, IgG: NEGATIVE
DONOR AG TYPE: NEGATIVE
Donor AG Type: NEGATIVE
PT AG TYPE: NEGATIVE
UNIT DIVISION: 0
Unit division: 0

## 2016-08-21 LAB — BPAM RBC
Blood Product Expiration Date: 201804242359
Blood Product Expiration Date: 201804242359
ISSUE DATE / TIME: 201804120947
ISSUE DATE / TIME: 201804121203
UNIT TYPE AND RH: 5100
Unit Type and Rh: 5100

## 2016-08-21 LAB — RAPID STREP SCREEN (MED CTR MEBANE ONLY): STREPTOCOCCUS, GROUP A SCREEN (DIRECT): NEGATIVE

## 2016-08-21 MED ORDER — SUCRALFATE 1 GM/10ML PO SUSP: 1.0000 g | Freq: Three times a day (TID) | ORAL | 0 refills | Status: DC

## 2016-08-21 MED ORDER — FAMOTIDINE 20 MG PO TABS: 20.0000 mg | ORAL_TABLET | Freq: Two times a day (BID) | ORAL | 0 refills | Status: DC

## 2016-08-21 NOTE — Discharge Instructions (Signed)
Please read and follow all provided instructions.  Your diagnoses today include:  1. Dysphagia, unspecified type     Tests performed today include:  Strep test - negative  Vital signs. See below for your results today.   Medications prescribed:   Carafate - for stomach upset and to protect your stomach   Pepcid (famotidine) - antihistamine  You can find this medication over-the-counter.   DO NOT exceed:   20mg  Pepcid every 12 hours  Take any prescribed medications only as directed.  Home care instructions:  Follow any educational materials contained in this packet.  Follow-up instructions: Please follow-up with the GI doctor referral in 1 week for further evaluation of your symptoms.   Return instructions:   Please return to the Emergency Department if you experience worsening symptoms.   Return if you cannot swallow your saliva or regurgitate food.  Please return if you have any other emergent concerns.  Additional Information:  Your vital signs today were: BP 109/66 (BP Location: Left Arm)    Pulse 64    Temp 98.5 F (36.9 C) (Oral)    Resp 18    Ht 5\' 6"  (1.676 m)    Wt 70.3 kg    LMP 07/15/2016 (Approximate)    SpO2 100%    BMI 25.02 kg/m  If your blood pressure (BP) was elevated above 135/85 this visit, please have this repeated by your doctor within one month. --------------

## 2016-08-21 NOTE — ED Triage Notes (Signed)
Patient was released from [facility] long yesterday. Patient found out she was [redacted] weeks pregnant and was anemic yesterday (received blood transfusion). Patient was having an anxiety attack when EMS arrived. EMS states patient did not have any signs of anaphylaxis.

## 2016-08-21 NOTE — ED Notes (Signed)
No Stridor noted pt able to swallow thin liquids w/o difficulty or cough Given warm liquids to soothe throat discomfort tolerating well

## 2016-08-21 NOTE — ED Provider Notes (Signed)
[facility] DEPT Provider Note   CSN: [id number] Arrival date & time: 08/21/16  6767  By signing my name below, I, Katie Powell, attest that this documentation has been prepared under the direction and in the presence of [employer], PA-C. Electronically Signed: Dora Powell, Scribe. 08/21/2016. 12:50 AM.  History   Chief Complaint Chief Complaint  Patient presents with  . Dysphagia    The history is provided by the patient. No language interpreter was used.    HPI Comments: Katie Powell is a 26 y.o. female who presents to the Emergency Department via EMS complaining of persistent dysphagia for several hours. She states that she feels like "there is something in my throat that won't go down" that is most significant when lying down. Pt states her difficulty swallowing "feels like reflux" and notes she has experienced dysphagia in the past. Pt states she became very nervous when her dysphagia presented and called EMS. She was admitted here early yesterday morning for anemia and was discharged yesterday afternoon after receiving a blood transfusion. She states the dizziness, weakness, and SOB she presented with yesterday morning have resolved. Patient was discovered to be in early pregnancy here yesterday after a positive urine pregnancy test. She denies SOB, sore throat, cough, fevers, chills, or any other associated symptoms.  Past Medical History:  Diagnosis Date  . Anemia     Patient Active Problem List   Diagnosis Date Noted  . Symptomatic anemia 08/20/2016  . Positive pregnancy test 08/20/2016  . Iron deficiency anemia due to chronic blood loss 08/20/2016  . Thrombocytosis ([place]) 08/20/2016    Past Surgical History:  Procedure Laterality Date  . FINGER SURGERY      OB History    Gravida Para Term Preterm AB Living   1             SAB TAB Ectopic Multiple Live Births                   Home Medications    Prior to Admission medications   Medication Sig  Start Date End Date Taking? Authorizing Provider  acetaminophen (TYLENOL) 500 MG tablet Take 1,000 mg by mouth every 6 (six) hours as needed for mild pain, moderate pain, fever or headache.   Yes Historical Provider, MD  ferrous sulfate 325 (65 FE) MG tablet Take 1 tablet (325 mg total) by mouth 2 (two) times daily with a meal. 10/24/14  Yes Marissa Sciacca, PA-C  fluticasone (FLONASE) 50 MCG/ACT nasal spray Place 2 sprays into both nostrils daily. 08/09/16  Yes Merryl Hacker, MD  folic acid (FOLVITE) 1 MG tablet Take 1 tablet (1 mg total) by mouth daily. 08/20/16   Verlee Monte, MD    Family History No family history on file.  Social History Social History  Substance Use Topics  . Smoking status: Current Every Day Smoker    Packs/day: 0.50    Types: Cigarettes  . Smokeless tobacco: Never Used  . Alcohol use No     Allergies   Patient has no known allergies.   Review of Systems Review of Systems  Constitutional: Negative for chills and fever.  HENT: Positive for trouble swallowing. Negative for rhinorrhea and sore throat.   Eyes: Negative for redness.  Respiratory: Negative for cough and shortness of breath.   Cardiovascular: Negative for chest pain.  Gastrointestinal: Negative for abdominal pain, diarrhea, nausea and vomiting.  Genitourinary: Negative for dysuria.  Musculoskeletal: Negative for myalgias.  Skin: Negative for rash.  Neurological: Negative for headaches.  Psychiatric/Behavioral: The patient is nervous/anxious.    Physical Exam Updated Vital Signs BP 109/66 (BP Location: Left Arm)   Pulse 64   Temp 98.5 F (36.9 C) (Oral)   Resp 18   Ht 5\' 6"  (1.676 m)   Wt 155 lb (70.3 kg)   LMP 07/15/2016 (Approximate)   SpO2 100%   BMI 25.02 kg/m   Physical Exam  Constitutional: She is oriented to person, place, and time. She appears well-developed and well-nourished. No distress.  HENT:  Head: Normocephalic and atraumatic.  Eyes: Conjunctivae and EOM are  normal. Right eye exhibits no discharge. Left eye exhibits no discharge.  Neck: Normal range of motion. Neck supple. No tracheal deviation present.  Swallows without regurgitation or coughing.  Cardiovascular: Normal rate, regular rhythm and normal heart sounds.   No murmur heard. Pulmonary/Chest: Effort normal and breath sounds normal. No respiratory distress. She has no wheezes. She has no rales.  Abdominal: Soft. There is no tenderness.  Musculoskeletal: Normal range of motion.  Neurological: She is alert and oriented to person, place, and time.  Skin: Skin is warm and dry.  Psychiatric: She has a normal mood and affect. Her behavior is normal.  Nursing note and vitals reviewed.  ED Treatments / Results  Labs (all labs ordered are listed, but only abnormal results are displayed) Labs Reviewed  RAPID STREP SCREEN (NOT AT [facility])  CULTURE, GROUP A STREP [facility])    Radiology Dg Chest Port 1 View  Result Date: 08/20/2016 CLINICAL DATA:  Shortness of breath and chest tightness without other symptoms. The patient has [redacted] weeks pregnant and was shielded. EXAM: PORTABLE CHEST 1 VIEW COMPARISON:  PA and lateral chest x-ray of October 24, 2014 FINDINGS: The lungs are well-expanded and clear. There is no pleural effusion or pneumothorax. The heart and pulmonary vascularity are normal. The mediastinum is normal in width. The bony thorax exhibits no acute abnormality. IMPRESSION: There is no active cardiopulmonary disease. Electronically Signed   By: David  Martinique M.D.   On: 08/20/2016 07:15    Procedures Procedures (including critical care time)  DIAGNOSTIC STUDIES: Oxygen Saturation is 100% on RA, normal by my interpretation.    COORDINATION OF CARE: 12:59 AM Discussed treatment plan with pt at bedside and pt agreed to plan.  Medications Ordered in ED Medications - No data to display   Initial Impression / Assessment and Plan / ED Course  I have reviewed the triage vital signs and the  nursing notes.  Pertinent labs & imaging results that were available during my care of the patient were reviewed by me and considered in my medical decision making (see chart for details).     Patient seen and examined. Reviewed strep test result.  Vital signs reviewed and are as follows: BP 109/66 (BP Location: Left Arm)   Pulse 64   Temp 98.5 F (36.9 C) (Oral)   Resp 18   Ht 5\' 6"  (1.676 m)   Wt 70.3 kg   LMP 07/15/2016 (Approximate)   SpO2 100%   BMI 25.02 kg/m   Encouraged use of Carafate and Pepcid for treatment of reflux disease this is causing her difficulty swallowing. She may also take Claritin if she feels her seasonal allergies are flaring up.  If these do not improve her symptoms, she is to follow-up with GI, referral given. Return with difficulty swallowing, inability to swallow or handle secretions.    Final Clinical Impressions(s) / ED Diagnoses   Final  diagnoses:  Dysphagia, unspecified type   Patient with dysphagia, she is protecting airway and has no regurgitation. No indication for emergent GI consultation. She has had some more mild symptoms in the past. Cannot rule out stricture or mechanical obstruction. Will treat probable GERD and seasonal allergies in pregnancy. GI referral given if not improved. Patient appears well, nontoxic. She admits to being very anxious earlier when EMS was called. These symptoms have improved since arrival and she has no persistent chest pain or shortness of breath.  New Prescriptions New Prescriptions   FAMOTIDINE (PEPCID) 20 MG TABLET    Take 1 tablet (20 mg total) by mouth 2 (two) times daily.   SUCRALFATE (CARAFATE) 1 GM/10ML SUSPENSION    Take 10 mLs (1 g total) by mouth 4 (four) times daily -  with meals and at bedtime.   I personally performed the services described in this documentation, which was scribed in my presence. The recorded information has been reviewed and is accurate.    Carlisle Cater, PA-C 08/21/16  [place], MD 08/21/16 [phone]

## 2016-08-23 LAB — CULTURE, GROUP A STREP (THRC)

## 2016-08-25 ENCOUNTER — Inpatient Hospital Stay (HOSPITAL_COMMUNITY): Payer: Medicaid Other

## 2016-08-25 ENCOUNTER — Inpatient Hospital Stay (HOSPITAL_COMMUNITY)
Admission: AD | Admit: 2016-08-25 | Discharge: 2016-08-25 | Disposition: A | Discharge status: Home / Self Care | Payer: Medicaid Other | Source: Ambulatory Visit | Attending: Family Medicine | Admitting: Family Medicine

## 2016-08-25 ENCOUNTER — Encounter: Payer: Self-pay | Admitting: Student

## 2016-08-25 DIAGNOSIS — O2341 Unspecified infection of urinary tract in pregnancy, first trimester: Secondary | ICD-10-CM | POA: Diagnosis not present

## 2016-08-25 DIAGNOSIS — O26899 Other specified pregnancy related conditions, unspecified trimester: Secondary | ICD-10-CM

## 2016-08-25 DIAGNOSIS — F17210 Nicotine dependence, cigarettes, uncomplicated: Secondary | ICD-10-CM | POA: Diagnosis not present

## 2016-08-25 DIAGNOSIS — R109 Unspecified abdominal pain: Secondary | ICD-10-CM | POA: Diagnosis present

## 2016-08-25 DIAGNOSIS — O3680X0 Pregnancy with inconclusive fetal viability, not applicable or unspecified: Secondary | ICD-10-CM

## 2016-08-25 DIAGNOSIS — O99331 Smoking (tobacco) complicating pregnancy, first trimester: Secondary | ICD-10-CM | POA: Insufficient documentation

## 2016-08-25 DIAGNOSIS — Z3A01 Less than 8 weeks gestation of pregnancy: Secondary | ICD-10-CM | POA: Diagnosis not present

## 2016-08-25 LAB — URINALYSIS, ROUTINE W REFLEX MICROSCOPIC
BILIRUBIN URINE: NEGATIVE
Glucose, UA: NEGATIVE mg/dL
Ketones, ur: NEGATIVE mg/dL
Nitrite: POSITIVE — AB
PH: 6 (ref 5.0–8.0)
Protein, ur: NEGATIVE mg/dL
SPECIFIC GRAVITY, URINE: 1.017 (ref 1.005–1.030)

## 2016-08-25 LAB — CBC
HCT: 28.9 % — ABNORMAL LOW (ref 36.0–46.0)
Hemoglobin: 8.5 g/dL — ABNORMAL LOW (ref 12.0–15.0)
MCH: 19.7 pg — AB (ref 26.0–34.0)
MCHC: 29.4 g/dL — AB (ref 30.0–36.0)
MCV: 66.9 fL — AB (ref 78.0–100.0)
PLATELETS: 284 10*3/uL (ref 150–400)
RBC: 4.32 MIL/uL (ref 3.87–5.11)
RDW: 28.7 % — AB (ref 11.5–15.5)
WBC: 4.8 10*3/uL (ref 4.0–10.5)

## 2016-08-25 LAB — WET PREP, GENITAL
Clue Cells Wet Prep HPF POC: NONE SEEN
Sperm: NONE SEEN
Trich, Wet Prep: NONE SEEN
Yeast Wet Prep HPF POC: NONE SEEN

## 2016-08-25 LAB — HCG, QUANTITATIVE, PREGNANCY: hCG, Beta Chain, Quant, S: 2164 m[IU]/mL — ABNORMAL HIGH (ref <5)

## 2016-08-25 MED ORDER — CEPHALEXIN 500 MG PO CAPS: 500.0000 mg | ORAL_CAPSULE | Freq: Four times a day (QID) | ORAL | 0 refills | Status: DC

## 2016-08-25 NOTE — Discharge Instructions (Signed)
Return to care  °· If you have heavier bleeding that soaks through more that 2 pads per hour for an hour or more °· If you bleed so much that you feel like you might pass out or you do pass out °· If you have significant abdominal pain that is not improved with Tylenol  °· If you develop a fever > 100.5 ° ° ° °Pregnancy and Urinary Tract Infection °What is a urinary tract infection? °A urinary tract infection (UTI) is an infection of any part of the urinary tract. This includes the kidneys, the tubes that connect your kidneys to your bladder (ureters), the bladder, and the tube that carries urine out of your body (urethra). These organs make, store, and get rid of urine in the body. A UTI can be a bladder infection (cystitis) or a kidney infection (pyelonephritis). This infection may be caused by fungi, viruses, and bacteria. Bacteria are the most common cause of UTIs. °You are more likely to develop a UTI during pregnancy because: °· The physical and hormonal changes your body goes through can make it easier for bacteria to get into your urinary tract. °· Your growing baby puts pressure on your uterus and can affect urine flow. ° °Does a UTI place my baby at risk? °An untreated UTI during pregnancy could lead to a kidney infection, which can cause health problems that could affect your baby. Possible complications of an untreated UTI include: °· Having your baby before 37 weeks of pregnancy (premature). °· Having a baby with a low birth weight. °· Developing high blood pressure during pregnancy (preeclampsia). ° °What are the symptoms of a UTI? °Symptoms of a UTI include: °· Fever. °· Frequent urination or passing small amounts of urine frequently. °· Needing to urinate urgently. °· Pain or a burning sensation with urination. °· Urine that smells bad or unusual. °· Cloudy urine. °· Pain in the lower abdomen or back. °· Trouble urinating. °· Blood in the urine. °· Vomiting or being less hungry than  normal. °· Diarrhea or abdominal pain. °· Vaginal discharge. ° °What are the treatment options for a UTI during pregnancy? °Treatment for this condition may include: °· Antibiotic medicines that are safe to take during pregnancy. °· Other medicines to treat less common causes of UTI. ° °How can I prevent a UTI? ° °To prevent a UTI: °· Go to the bathroom as soon as you feel the need. °· Always wipe from front to back. °· Wash your genital area with soap and warm water daily. °· Empty your bladder before and after sex. °· Wear cotton underwear. °· Limit your intake of high sugar foods or drinks, such as regular soda, juice, and sweets. °· Drink 6-8 glasses of water daily. °· Do not wear tight-fitting pants. °· Do not douche or use deodorant sprays. °· Do not drink alcohol, caffeine, or carbonated drinks. These can irritate the bladder. ° °Contact a health care provider if: °· Your symptoms do not improve or get worse. °· You have a fever after two days of treatment. °· You have a rash. °· You have abnormal vaginal discharge. °· You have back or side pain. °· You have chills. °· You have nausea and vomiting. °Get help right away if: °Seek immediate medical care if you are pregnant and: °· You feel contractions in your uterus. °· You have lower belly pain. °· You have a gush of fluid from your vagina. °· You have blood in your urine. °· You are   vomiting and cannot keep down any medicines or water. ° °This information is not intended to replace advice given to you by your health care provider. Make sure you discuss any questions you have with your health care provider. °Document Released: 08/22/2010 Document Revised: 04/10/2016 Document Reviewed: 03/18/2015 °Elsevier Interactive Patient Education © 2017 [employer]. ° °

## 2016-08-25 NOTE — MAU Note (Signed)
Pt c/o lower abdominal pain for the last two days that comes and goes in intensity. Pt denies bleeding and abnormal discharge.

## 2016-08-25 NOTE — MAU Provider Note (Signed)
History     CSN: [id number]  Arrival date and time: 08/25/16 1028  First Provider Initiated Contact with Patient 08/25/16 1120   Chief Complaint  Patient presents with  . Abdominal Pain   HPI  Katie Powell is a 26 y.o. G2P0010 at [redacted]w[redacted]d by LMP who presents with abdominal pain. Symptoms began 2 days ago. Reports lower abdominal cramping that is intermittent. Rates pain 7/10. Has not treated. Lying on her abdomen makes pain worse & nothing makes pain better. Endorses constipation since being on iron supplement. Had small hard BM last night. Denies n/v/d, vaginal bleeding, vaginal discharge, or dysuria.   OB History    Gravida Para Term Preterm AB Living   2       1     SAB TAB Ectopic Multiple Live Births     1            Past Medical History:  Diagnosis Date  . Anemia     Past Surgical History:  Procedure Laterality Date  . FINGER SURGERY      History reviewed. No pertinent family history.  Social History  Substance Use Topics  . Smoking status: Current Every Day Smoker    Packs/day: 0.50    Types: Cigarettes  . Smokeless tobacco: Never Used  . Alcohol use No    Allergies: No Known Allergies  Prescriptions Prior to Admission  Medication Sig Dispense Refill Last Dose  . famotidine (PEPCID) 20 MG tablet Take 1 tablet (20 mg total) by mouth 2 (two) times daily. 30 tablet 0 08/24/2016 at Unknown time  . ferrous sulfate 325 (65 FE) MG tablet Take 1 tablet (325 mg total) by mouth 2 (two) times daily with a meal. 30 tablet 2 08/24/2016 at Unknown time  . folic acid (FOLVITE) 1 MG tablet Take 1 tablet (1 mg total) by mouth daily.   08/24/2016 at Unknown time  . Prenatal Vit-Fe Fumarate-FA (PRENATAL MULTIVITAMIN) TABS tablet Take 1 tablet by mouth daily at 12 noon.   08/24/2016 at Unknown time  . fluticasone (FLONASE) 50 MCG/ACT nasal spray Place 2 sprays into both nostrils daily. (Patient not taking: Reported on 08/25/2016) 16 g 0 Not Taking at Unknown time  . sucralfate  (CARAFATE) 1 GM/10ML suspension Take 10 mLs (1 g total) by mouth 4 (four) times daily -  with meals and at bedtime. (Patient not taking: Reported on 08/25/2016) 420 mL 0 Not Taking at Unknown time    Review of Systems  Constitutional: Negative.   Gastrointestinal: Positive for abdominal pain and constipation. Negative for blood in stool, diarrhea, nausea and vomiting.  Genitourinary: Negative for dyspareunia, dysuria, hematuria, vaginal bleeding and vaginal discharge.   Physical Exam   Blood pressure 116/67, pulse 68, temperature 98.5 F (36.9 C), temperature source Oral, resp. rate 16, height 5\' 6"  (1.676 m), weight 149 lb 12 oz (67.9 kg), last menstrual period 07/15/2016, SpO2 100 %.  Physical Exam  Nursing note and vitals reviewed. Constitutional: She is oriented to person, place, and time. She appears well-developed and well-nourished. No distress.  HENT:  Head: Normocephalic and atraumatic.  Eyes: Conjunctivae are normal. Right eye exhibits no discharge. Left eye exhibits no discharge. No scleral icterus.  Neck: Normal range of motion.  Cardiovascular: Normal rate, regular rhythm and normal heart sounds.   No murmur heard. Respiratory: Effort normal and breath sounds normal. No respiratory distress. She has no wheezes.  GI: Soft. Bowel sounds are normal. She exhibits no distension. There is no tenderness.  There is no rebound and no guarding.  Genitourinary: Uterus normal. Cervix exhibits no motion tenderness. Right adnexum displays no mass and no tenderness. Left adnexum displays no mass and no tenderness.  Genitourinary Comments: Cervix closed  Neurological: She is alert and oriented to person, place, and time.  Skin: Skin is warm and dry. She is not diaphoretic.  Psychiatric: She has a normal mood and affect. Her behavior is normal. Judgment and thought content normal.    MAU Course  Procedures Results for orders placed or performed during the hospital encounter of 08/25/16  (from the past 24 hour(s))  Urinalysis, Routine w reflex microscopic     Status: Abnormal   Collection Time: 08/25/16 10:50 AM  Result Value Ref Range   Color, Urine YELLOW YELLOW   APPearance HAZY (A) CLEAR   Specific Gravity, Urine 1.017 1.005 - 1.030   pH 6.0 5.0 - 8.0   Glucose, UA NEGATIVE NEGATIVE mg/dL   Hgb urine dipstick SMALL (A) NEGATIVE   Bilirubin Urine NEGATIVE NEGATIVE   Ketones, ur NEGATIVE NEGATIVE mg/dL   Protein, ur NEGATIVE NEGATIVE mg/dL   Nitrite POSITIVE (A) NEGATIVE   Leukocytes, UA MODERATE (A) NEGATIVE   RBC / HPF 6-30 0 - 5 RBC/hpf   WBC, UA 6-30 0 - 5 WBC/hpf   Bacteria, UA FEW (A) NONE SEEN   Squamous Epithelial / LPF 6-30 (A) NONE SEEN   Mucous PRESENT   Wet prep, genital     Status: Abnormal   Collection Time: 08/25/16 11:17 AM  Result Value Ref Range   Yeast Wet Prep HPF POC NONE SEEN NONE SEEN   Trich, Wet Prep NONE SEEN NONE SEEN   Clue Cells Wet Prep HPF POC NONE SEEN NONE SEEN   WBC, Wet Prep HPF POC FEW (A) NONE SEEN   Sperm NONE SEEN   CBC     Status: Abnormal   Collection Time: 08/25/16 11:28 AM  Result Value Ref Range   WBC 4.8 4.0 - 10.5 K/uL   RBC 4.32 3.87 - 5.11 MIL/uL   Hemoglobin 8.5 (L) 12.0 - 15.0 g/dL   HCT 28.9 (L) 36.0 - 46.0 %   MCV 66.9 (L) 78.0 - 100.0 fL   MCH 19.7 (L) 26.0 - 34.0 pg   MCHC 29.4 (L) 30.0 - 36.0 g/dL   RDW 28.7 (H) 11.5 - 15.5 %   Platelets 284 150 - 400 K/uL  hCG, quantitative, pregnancy     Status: Abnormal   Collection Time: 08/25/16 11:28 AM  Result Value Ref Range   hCG, Beta Chain, Quant, S 2,164 (H) <5 mIU/mL   US Ob Comp Less 14 Wks  Result Date: 08/25/2016 CLINICAL DATA:  Pregnant, abdominal cramping x2 days EXAM: OBSTETRIC <14 WK [place] AND TRANSVAGINAL OB US TECHNIQUE: Both transabdominal and transvaginal ultrasound examinations were performed for complete evaluation of the gestation as well as the maternal uterus, adnexal regions, and pelvic cul-de-sac. Transvaginal technique was performed to  assess early pregnancy. COMPARISON:  None. FINDINGS: Intrauterine gestational sac: Single Yolk sac:  Not Visualized. Embryo:  Not Visualized. MSD: 3.5  mm   5 w   0  d Subchorionic hemorrhage:  None visualized. Maternal uterus/adnexae: Bilateral ovaries are within normal limits, noting a right corpus luteal cyst. Small volume fluid in the endometrial fundus. IMPRESSION: Single intrauterine gestational sac, measuring 5 weeks 0 days by mean sac diameter, without visualized yolk sac or fetal pole. Consider follow-up pelvic ultrasound in 14 days to confirm viability, as clinically  warranted. Electronically Signed   By: Julian Hy M.D.   On: 08/25/2016 12:34   US Ob Transvaginal  Result Date: 08/25/2016 CLINICAL DATA:  Pregnant, abdominal cramping x2 days EXAM: OBSTETRIC <14 WK [place] AND TRANSVAGINAL OB US TECHNIQUE: Both transabdominal and transvaginal ultrasound examinations were performed for complete evaluation of the gestation as well as the maternal uterus, adnexal regions, and pelvic cul-de-sac. Transvaginal technique was performed to assess early pregnancy. COMPARISON:  None. FINDINGS: Intrauterine gestational sac: Single Yolk sac:  Not Visualized. Embryo:  Not Visualized. MSD: 3.5  mm   5 w   0  d Subchorionic hemorrhage:  None visualized. Maternal uterus/adnexae: Bilateral ovaries are within normal limits, noting a right corpus luteal cyst. Small volume fluid in the endometrial fundus. IMPRESSION: Single intrauterine gestational sac, measuring 5 weeks 0 days by mean sac diameter, without visualized yolk sac or fetal pole. Consider follow-up pelvic ultrasound in 14 days to confirm viability, as clinically warranted. Electronically Signed   By: Julian Hy M.D.   On: 08/25/2016 12:34     MDM +UPT UA, wet prep, GC/chlamydia, CBC, ABO/Rh, quant hCG, HIV, and [place] today to rule out ectopic pregnancy O positive U/a + nitrites Ultrasound -- IUGS, no yolk sac Hemoglobin 8.7 -- increase from last  week, pt taking iron supplements  Assessment and Plan  A: 1. Urinary tract infection in mother during first trimester of pregnancy   2. Abdominal cramping affecting pregnancy   3. Pregnancy of unknown anatomic location    P: Discharge home Rx keflex Discussed reasons to return to MAU Return to MAU Thursday for f/u BHCG -- can't come to [place] d/t work GC/CT & urine culture pending   Jorje Guild 08/25/2016, 11:12 AM

## 2016-08-26 ENCOUNTER — Other Ambulatory Visit: Payer: Self-pay | Admitting: Certified Nurse Midwife

## 2016-08-26 ENCOUNTER — Telehealth (HOSPITAL_COMMUNITY): Payer: Self-pay

## 2016-08-26 LAB — CULTURE, OB URINE

## 2016-08-26 LAB — GC/CHLAMYDIA PROBE AMP (~~LOC~~) NOT AT ARMC
Chlamydia: POSITIVE — AB
NEISSERIA GONORRHEA: NEGATIVE

## 2016-08-26 LAB — HIV ANTIBODY (ROUTINE TESTING W REFLEX): HIV SCREEN 4TH GENERATION: NONREACTIVE

## 2016-08-26 MED ORDER — AZITHROMYCIN 500 MG PO TABS: 1000.0000 mg | ORAL_TABLET | Freq: Once | ORAL | 0 refills | Status: AC

## 2016-08-26 NOTE — Telephone Encounter (Signed)
Called patient, and verified DOB. Pt aware her lab results during her last visit to [facility] showed she was positive for Chlamydia. Verified pharmacy with patient. Pt aware a prescription (Azithromycin 1 gram x 1 dose) will be called into her pharmacy for treatment of her positive results. She will take the pills as directed. Patient aware once treatment is received, abstain from intercourse x 2 weeks. Also, she is aware to inform her partner because they will also need to be treated. Communicable Disease Report faxed to health department.

## 2016-08-26 NOTE — Progress Notes (Signed)
+  Chlamydia, Rx sent for Azithromycin 1g sent to pharmacy.

## 2016-08-27 ENCOUNTER — Ambulatory Visit: Payer: Self-pay

## 2016-08-27 DIAGNOSIS — O3680X0 Pregnancy with inconclusive fetal viability, not applicable or unspecified: Secondary | ICD-10-CM

## 2016-08-27 LAB — HCG, QUANTITATIVE, PREGNANCY: hCG, Beta Chain, Quant, S: 2792 m[IU]/mL — ABNORMAL HIGH (ref <5)

## 2016-08-27 NOTE — Progress Notes (Signed)
Patient was sent down from MAU to have BHCG STAT done. Per Almyra Free patient does not have to wait for results. I have given her a number to call us back regarding her results in   in 2-3 hours has increase at this time. Per Dr.Arnold patient should follow up with u/s in 14 days for viability. Patient has been informed of test results and will follow up in 14 days.

## 2016-08-31 ENCOUNTER — Other Ambulatory Visit: Payer: Self-pay | Admitting: Obstetrics & Gynecology

## 2016-09-10 ENCOUNTER — Ambulatory Visit (HOSPITAL_COMMUNITY)
Admission: RE | Admit: 2016-09-10 | Discharge: 2016-09-10 | Disposition: A | Discharge status: Home / Self Care | Payer: Medicaid Other | Source: Ambulatory Visit | Attending: Obstetrics & Gynecology | Admitting: Obstetrics & Gynecology

## 2016-09-10 DIAGNOSIS — O3680X0 Pregnancy with inconclusive fetal viability, not applicable or unspecified: Secondary | ICD-10-CM

## 2016-09-10 DIAGNOSIS — Z3A01 Less than 8 weeks gestation of pregnancy: Secondary | ICD-10-CM | POA: Diagnosis not present

## 2016-09-10 DIAGNOSIS — Z3491 Encounter for supervision of normal pregnancy, unspecified, first trimester: Secondary | ICD-10-CM | POA: Insufficient documentation

## 2016-09-11 ENCOUNTER — Inpatient Hospital Stay (HOSPITAL_COMMUNITY)
Admission: AD | Admit: 2016-09-11 | Discharge: 2016-09-12 | Disposition: A | Discharge status: Home / Self Care | Payer: Medicaid Other | Source: Ambulatory Visit | Attending: Obstetrics and Gynecology | Admitting: Obstetrics and Gynecology

## 2016-09-11 DIAGNOSIS — O209 Hemorrhage in early pregnancy, unspecified: Secondary | ICD-10-CM

## 2016-09-11 DIAGNOSIS — F17210 Nicotine dependence, cigarettes, uncomplicated: Secondary | ICD-10-CM | POA: Diagnosis not present

## 2016-09-11 DIAGNOSIS — Z3A01 Less than 8 weeks gestation of pregnancy: Secondary | ICD-10-CM | POA: Insufficient documentation

## 2016-09-11 DIAGNOSIS — O200 Threatened abortion: Secondary | ICD-10-CM | POA: Insufficient documentation

## 2016-09-11 DIAGNOSIS — O99331 Smoking (tobacco) complicating pregnancy, first trimester: Secondary | ICD-10-CM | POA: Diagnosis not present

## 2016-09-11 DIAGNOSIS — N939 Abnormal uterine and vaginal bleeding, unspecified: Secondary | ICD-10-CM | POA: Diagnosis present

## 2016-09-11 DIAGNOSIS — O3680X0 Pregnancy with inconclusive fetal viability, not applicable or unspecified: Secondary | ICD-10-CM | POA: Diagnosis not present

## 2016-09-11 LAB — URINALYSIS, ROUTINE W REFLEX MICROSCOPIC
BACTERIA UA: NONE SEEN
Bilirubin Urine: NEGATIVE
GLUCOSE, UA: NEGATIVE mg/dL
Ketones, ur: NEGATIVE mg/dL
Nitrite: NEGATIVE
Protein, ur: NEGATIVE mg/dL
SPECIFIC GRAVITY, URINE: 1.024 (ref 1.005–1.030)
pH: 7 (ref 5.0–8.0)

## 2016-09-11 NOTE — MAU Note (Signed)
I was spotting earlier today and worse tonight. Blood on tissue was bright red to dark red. No pain.

## 2016-09-12 ENCOUNTER — Encounter (HOSPITAL_COMMUNITY): Payer: Self-pay | Admitting: *Deleted

## 2016-09-12 ENCOUNTER — Inpatient Hospital Stay (HOSPITAL_COMMUNITY): Payer: Medicaid Other

## 2016-09-12 DIAGNOSIS — O200 Threatened abortion: Secondary | ICD-10-CM

## 2016-09-12 NOTE — MAU Provider Note (Signed)
Chief Complaint: Vaginal Bleeding   None     SUBJECTIVE HPI: Katie Powell is a 26 y.o. G2P0010 at [redacted]w[redacted]d by LMP who presents to maternity admissions reporting spotting off and on today, increasing to bright red requiring a pantyliner. She has not tried any treatments and nothing makes it better. She is concerned that lifting heavy boxes at work may have made it worse but she is not sure. There is no pain and not other associated symptoms.  She initially presented to MAU on 4/17 with abdominal pain and had quant hcg of 2,164 and  US showing gestational sac only in the uterus.  She was also treated for positive chlamydia diagnosed on 4/17.  Her follow up quant hcg in 2 days was 2,792. She was scheduled an outpatient [place] in on 09/10/16 which showed a gestational sac, yolk sac and fetal pole but with FHR of 95 at [redacted]w[redacted]d.  She was not presented [place] results on that date.  She has another [place] and her New OB visit scheduled at [facility] in 1 month.  She denies vaginal itching/burning, urinary symptoms, h/a, dizziness, n/v, or fever/chills.     HPI  Past Medical History:  Diagnosis Date  . Anemia    Past Surgical History:  Procedure Laterality Date  . FINGER SURGERY     Social History   Social History  . Marital status: Single    Spouse name: N/A  . Number of children: N/A  . Years of education: N/A   Occupational History  . Not on file.   Social History Main Topics  . Smoking status: Current Every Day Smoker    Packs/day: 0.50    Types: Cigarettes  . Smokeless tobacco: Never Used  . Alcohol use No  . Drug use: No  . Sexual activity: Yes    Birth control/ protection: None   Other Topics Concern  . Not on file   Social History Narrative  . No narrative on file   No current facility-administered medications on file prior to encounter.    Current Outpatient Prescriptions on File Prior to Encounter  Medication Sig Dispense Refill  . famotidine (PEPCID) 20 MG tablet Take 1 tablet (20 mg  total) by mouth 2 (two) times daily. 30 tablet 0  . ferrous sulfate 325 (65 FE) MG tablet Take 1 tablet (325 mg total) by mouth 2 (two) times daily with a meal. 30 tablet 2  . folic acid (FOLVITE) 1 MG tablet Take 1 tablet (1 mg total) by mouth daily.    . Prenatal Vit-Fe Fumarate-FA (PRENATAL MULTIVITAMIN) TABS tablet Take 1 tablet by mouth daily at 12 noon.    . fluticasone (FLONASE) 50 MCG/ACT nasal spray Place 2 sprays into both nostrils daily. (Patient not taking: Reported on 08/25/2016) 16 g 0  . sucralfate (CARAFATE) 1 GM/10ML suspension Take 10 mLs (1 g total) by mouth 4 (four) times daily -  with meals and at bedtime. (Patient not taking: Reported on 08/25/2016) 420 mL 0   No Known Allergies  ROS:  Review of Systems  Constitutional: Negative for chills, fatigue and fever.  Respiratory: Negative for shortness of breath.   Cardiovascular: Negative for chest pain.  Gastrointestinal: Negative for nausea and vomiting.  Genitourinary: Positive for vaginal bleeding. Negative for difficulty urinating, dysuria, flank pain, pelvic pain, vaginal discharge and vaginal pain.  Neurological: Negative for dizziness and headaches.  Psychiatric/Behavioral: Negative.      I have reviewed patient's Past Medical Hx, Surgical Hx, Family  Hx, Social Hx, medications and allergies.   Physical Exam   Patient Vitals for the past 24 hrs:  BP Temp Pulse Resp Height Weight  09/12/16 0254 119/76 98.1 F (36.7 C) (!) 54 18 - -  09/11/16 2335 114/72 98.1 F (36.7 C) 67 18 5\' 7"  (1.702 m) 151 lb (68.5 kg)   Constitutional: Well-developed, well-nourished female in no acute distress.  Cardiovascular: normal rate Respiratory: normal effort GI: Abd soft, non-tender. Pos BS x 4 MS: Extremities nontender, no edema, normal ROM Neurologic: Alert and oriented x 4.  GU: Neg CVAT.  PELVIC EXAM: Deferred, visualized scant red bleeding on pantyliner, too early for TOC for chlamydia   LAB RESULTS Results for  orders placed or performed during the hospital encounter of 09/11/16 (from the past 24 hour(s))  Urinalysis, Routine w reflex microscopic     Status: Abnormal   Collection Time: 09/11/16 11:40 PM  Result Value Ref Range   Color, Urine YELLOW YELLOW   APPearance CLEAR CLEAR   Specific Gravity, Urine 1.024 1.005 - 1.030   pH 7.0 5.0 - 8.0   Glucose, UA NEGATIVE NEGATIVE mg/dL   Hgb urine dipstick LARGE (A) NEGATIVE   Bilirubin Urine NEGATIVE NEGATIVE   Ketones, ur NEGATIVE NEGATIVE mg/dL   Protein, ur NEGATIVE NEGATIVE mg/dL   Nitrite NEGATIVE NEGATIVE   Leukocytes, UA TRACE (A) NEGATIVE   RBC / HPF TOO NUMEROUS TO COUNT 0 - 5 RBC/hpf   WBC, UA 0-5 0 - 5 WBC/hpf   Bacteria, UA NONE SEEN NONE SEEN   Squamous Epithelial / LPF 0-5 (A) NONE SEEN   Mucous PRESENT     --/--/O POS (04/12 0459)  IMAGING US Ob Comp Less 14 Wks  Result Date: 08/25/2016 CLINICAL DATA:  Pregnant, abdominal cramping x2 days EXAM: OBSTETRIC <14 WK [place] AND TRANSVAGINAL OB US TECHNIQUE: Both transabdominal and transvaginal ultrasound examinations were performed for complete evaluation of the gestation as well as the maternal uterus, adnexal regions, and pelvic cul-de-sac. Transvaginal technique was performed to assess early pregnancy. COMPARISON:  None. FINDINGS: Intrauterine gestational sac: Single Yolk sac:  Not Visualized. Embryo:  Not Visualized. MSD: 3.5  mm   5 w   0  d Subchorionic hemorrhage:  None visualized. Maternal uterus/adnexae: Bilateral ovaries are within normal limits, noting a right corpus luteal cyst. Small volume fluid in the endometrial fundus. IMPRESSION: Single intrauterine gestational sac, measuring 5 weeks 0 days by mean sac diameter, without visualized yolk sac or fetal pole. Consider follow-up pelvic ultrasound in 14 days to confirm viability, as clinically warranted. Electronically Signed   By: Julian Hy M.D.   On: 08/25/2016 12:34   US Ob Transvaginal  Result Date: 09/12/2016 CLINICAL  DATA:  Bleeding, bright red blood today.  No pain EXAM: TRANSVAGINAL OB ULTRASOUND TECHNIQUE: Transvaginal ultrasound was performed for complete evaluation of the gestation as well as the maternal uterus, adnexal regions, and pelvic cul-de-sac. COMPARISON:  09/10/2016 FINDINGS: Intrauterine gestational sac: Single Yolk sac:  Visible Embryo:  Visible Cardiac Activity: Visible Heart Rate: 94 bpm MSD:   mm    w     d CRL:   6.2  mm   6 w 3 d                  [place] EDC: 05/05/2017 Subchorionic hemorrhage:  Small Maternal uterus/adnexae: Normal ovaries.  Trace free pelvic fluid. IMPRESSION: Single living intrauterine gestation measuring 6 weeks 3 days by crown-rump length. Electronically Signed   By: Quillian Quince  Armandina Stammer M.D.   On: 09/12/2016 02:28   US Ob Transvaginal  Result Date: 09/10/2016 CLINICAL DATA:  First trimester pregnancy with inconclusive fetal viability. EXAM: TRANSVAGINAL OB ULTRASOUND TECHNIQUE: Transvaginal ultrasound was performed for complete evaluation of the gestation as well as the maternal uterus, adnexal regions, and pelvic cul-de-sac. COMPARISON:  08/25/2016 FINDINGS: Intrauterine gestational sac: Single Yolk sac:  Visualized. Embryo:  Visualized. Cardiac Activity: Visualized. Heart Rate: 95 bpm CRL:   5  mm   6 w 1 d                  [place] EDC: 05/05/2017 Subchorionic hemorrhage:  None visualized. Maternal uterus/adnexae: Normal appearance of both ovaries. No mass or abnormal free fluid identified. IMPRESSION: Single living IUP measuring 6 weeks 1 day, with [place] EDC of 05/05/2017. No significant maternal uterine or adnexal abnormality identified. Electronically Signed   By: Earle Gell M.D.   On: 09/10/2016 11:32   US Ob Transvaginal  Result Date: 08/25/2016 CLINICAL DATA:  Pregnant, abdominal cramping x2 days EXAM: OBSTETRIC <14 WK [place] AND TRANSVAGINAL OB US TECHNIQUE: Both transabdominal and transvaginal ultrasound examinations were performed for complete evaluation of the gestation as well as the  maternal uterus, adnexal regions, and pelvic cul-de-sac. Transvaginal technique was performed to assess early pregnancy. COMPARISON:  None. FINDINGS: Intrauterine gestational sac: Single Yolk sac:  Not Visualized. Embryo:  Not Visualized. MSD: 3.5  mm   5 w   0  d Subchorionic hemorrhage:  None visualized. Maternal uterus/adnexae: Bilateral ovaries are within normal limits, noting a right corpus luteal cyst. Small volume fluid in the endometrial fundus. IMPRESSION: Single intrauterine gestational sac, measuring 5 weeks 0 days by mean sac diameter, without visualized yolk sac or fetal pole. Consider follow-up pelvic ultrasound in 14 days to confirm viability, as clinically warranted. Electronically Signed   By: Julian Hy M.D.   On: 08/25/2016 12:34   Dg Chest Port 1 View  Result Date: 08/20/2016 CLINICAL DATA:  Shortness of breath and chest tightness without other symptoms. The patient has [redacted] weeks pregnant and was shielded. EXAM: PORTABLE CHEST 1 VIEW COMPARISON:  PA and lateral chest x-ray of October 24, 2014 FINDINGS: The lungs are well-expanded and clear. There is no pleural effusion or pneumothorax. The heart and pulmonary vascularity are normal. The mediastinum is normal in width. The bony thorax exhibits no acute abnormality. IMPRESSION: There is no active cardiopulmonary disease. Electronically Signed   By: David  Martinique M.D.   On: 08/20/2016 07:15    MAU Management/MDM: Ordered labs and [place] and reviewed results.  IUP with positive FHR on today's [place] but HR lower than normal.  Discussed inappropriate rise in hcg and abnormal [place] with pt.  Pt tearful with appropriate questions.  Bleeding/miscarriage precautions given.  Outpatient [place] with immediate follow up in office scheduled.  Return to MAU for emergencies.  Pt stable at time of discharge.  ASSESSMENT 1. Threatened miscarriage in early pregnancy   2. Vaginal bleeding in pregnancy, first trimester   3. Pregnancy with uncertain fetal viability,  single or unspecified fetus     PLAN Discharge home with bleeding precautions  Allergies as of 09/12/2016   No Known Allergies     Medication List    STOP taking these medications   cephALEXin 500 MG capsule Commonly known as:  [place] these medications   famotidine 20 MG tablet Commonly known as:  PEPCID Take 1 tablet (20 mg total) by mouth 2 (  two) times daily.   ferrous sulfate 325 (65 FE) MG tablet Take 1 tablet (325 mg total) by mouth 2 (two) times daily with a meal.   fluticasone 50 MCG/ACT nasal spray Commonly known as:  FLONASE Place 2 sprays into both nostrils daily.   folic acid 1 MG tablet Commonly known as:  FOLVITE Take 1 tablet (1 mg total) by mouth daily.   prenatal multivitamin Tabs tablet Take 1 tablet by mouth daily at 12 noon.   sucralfate 1 GM/10ML suspension Commonly known as:  CARAFATE Take 10 mLs (1 g total) by mouth 4 (four) times daily -  with meals and at bedtime.      Follow-up [facility] Follow up.   Specialty:  Radiology Why:  Radiology will call you to schedule an ultrasound in 7-10 days, return to MAU as needed for emergencies Contact information: [address] [id number] [place] [facility] Follow up.   Specialty:  Obstetrics and Gynecology Why:  As scheduled Contact information: [place] Certified Nurse-Midwife 09/12/2016  4:35 AM

## 2016-09-12 NOTE — Progress Notes (Signed)
Lisa Leftwich-[name] CNM in earlier to discuss test results and d/c plan. Written and verbal d/c instructions given and understanding voiced. 

## 2016-09-18 ENCOUNTER — Ambulatory Visit (HOSPITAL_COMMUNITY): Payer: Medicaid Other

## 2016-09-21 ENCOUNTER — Inpatient Hospital Stay (HOSPITAL_COMMUNITY)
Admission: AD | Admit: 2016-09-21 | Discharge: 2016-09-22 | Disposition: A | Discharge status: Home / Self Care | Payer: Medicaid Other | Source: Ambulatory Visit | Attending: Obstetrics and Gynecology | Admitting: Obstetrics and Gynecology

## 2016-09-21 ENCOUNTER — Inpatient Hospital Stay (HOSPITAL_COMMUNITY): Payer: Medicaid Other

## 2016-09-21 ENCOUNTER — Encounter (HOSPITAL_COMMUNITY): Payer: Self-pay | Admitting: *Deleted

## 2016-09-21 DIAGNOSIS — D649 Anemia, unspecified: Secondary | ICD-10-CM | POA: Diagnosis not present

## 2016-09-21 DIAGNOSIS — Z3A01 Less than 8 weeks gestation of pregnancy: Secondary | ICD-10-CM | POA: Insufficient documentation

## 2016-09-21 DIAGNOSIS — O209 Hemorrhage in early pregnancy, unspecified: Secondary | ICD-10-CM

## 2016-09-21 DIAGNOSIS — Z87891 Personal history of nicotine dependence: Secondary | ICD-10-CM | POA: Diagnosis not present

## 2016-09-21 DIAGNOSIS — O99012 Anemia complicating pregnancy, second trimester: Secondary | ICD-10-CM | POA: Insufficient documentation

## 2016-09-21 DIAGNOSIS — O208 Other hemorrhage in early pregnancy: Secondary | ICD-10-CM

## 2016-09-21 DIAGNOSIS — O021 Missed abortion: Secondary | ICD-10-CM

## 2016-09-21 NOTE — MAU Note (Signed)
Pt here with c/o vaginal bleeding. Was here 2 weeks ago, had bloodwork and [place] then. Has F/U on Thursday for repeat blood work and [place]

## 2016-09-22 DIAGNOSIS — O021 Missed abortion: Secondary | ICD-10-CM | POA: Diagnosis not present

## 2016-09-22 LAB — WET PREP, GENITAL
Clue Cells Wet Prep HPF POC: NONE SEEN
SPERM: NONE SEEN
Trich, Wet Prep: NONE SEEN
Yeast Wet Prep HPF POC: NONE SEEN

## 2016-09-22 LAB — GC/CHLAMYDIA PROBE AMP (~~LOC~~) NOT AT ARMC
Chlamydia: NEGATIVE
Neisseria Gonorrhea: NEGATIVE

## 2016-09-22 LAB — HCG, QUANTITATIVE, PREGNANCY: HCG, BETA CHAIN, QUANT, S: 940 m[IU]/mL — AB (ref <5)

## 2016-09-22 LAB — CBC
HCT: 32.7 % — ABNORMAL LOW (ref 36.0–46.0)
Hemoglobin: 10.3 g/dL — ABNORMAL LOW (ref 12.0–15.0)
MCH: 24 pg — AB (ref 26.0–34.0)
MCHC: 31.5 g/dL (ref 30.0–36.0)
MCV: 76.2 fL — AB (ref 78.0–100.0)
PLATELETS: 228 10*3/uL (ref 150–400)
RBC: 4.29 MIL/uL (ref 3.87–5.11)
WBC: 5.9 10*3/uL (ref 4.0–10.5)

## 2016-09-22 LAB — ABO/RH: ABO/RH(D): O POS

## 2016-09-22 MED ORDER — PROMETHAZINE HCL 12.5 MG PO TABS: ORAL_TABLET | ORAL | 0 refills | Status: DC

## 2016-09-22 MED ORDER — IBUPROFEN 600 MG PO TABS: 600.0000 mg | ORAL_TABLET | Freq: Four times a day (QID) | ORAL | 0 refills | Status: DC | PRN

## 2016-09-22 MED ORDER — MISOPROSTOL 200 MCG PO TABS: 600.0000 ug | ORAL_TABLET | Freq: Once | ORAL | 1 refills | Status: DC

## 2016-09-22 MED ORDER — OXYCODONE-ACETAMINOPHEN 5-325 MG PO TABS: 1.0000 | ORAL_TABLET | ORAL | 0 refills | Status: DC | PRN

## 2016-09-22 NOTE — Discharge Instructions (Signed)

## 2016-09-22 NOTE — MAU Provider Note (Signed)
Chief Complaint: Vaginal Bleeding   SUBJECTIVE HPI: Katie Powell is a 26 y.o. G2P0010 at [redacted]w[redacted]d who presents to Maternity Admissions reporting vaginal bleeding with known pregnancy.  Patient has been followed for this pregnancy due to abnormally rising BHCG. She is supposed to have a repeat US in 2 days. She has been having 2 weeks of VB, like a light period. She is also c/o 1 week of cramping, however cramping became severe today, just prior to arrival. Had passage of some clots today too. Denies any passage of tissue.  This pregnancy was unplanned but wanted. She states she had a blood tranfusion about 1 month ago due to severe anemia from history of menorrhagia. She has been taking iron tablets twice daily. She also has a recent +CT, treated and so was partner. She has not had intercourse since.     Past Medical History:  Diagnosis Date  . Anemia    OB History  Gravida Para Term Preterm AB Living  2       1    SAB TAB Ectopic Multiple Live Births    1          # Outcome Date GA Lbr Len/2nd Weight Sex Delivery Anes PTL Lv  2 Current           1 TAB              Past Surgical History:  Procedure Laterality Date  . FINGER SURGERY     Social History   Social History  . Marital status: Single    Spouse name: N/A  . Number of children: N/A  . Years of education: N/A   Occupational History  . Not on file.   Social History Main Topics  . Smoking status: Former Smoker    Packs/day: 0.50    Types: Cigarettes    Quit date: 09/14/2016  . Smokeless tobacco: Never Used  . Alcohol use No  . Drug use: No  . Sexual activity: Yes    Birth control/ protection: None     Comment: last intercourse 2 months ago   Other Topics Concern  . Not on file   Social History Narrative  . No narrative on file   No current facility-administered medications on file prior to encounter.    Current Outpatient Prescriptions on File Prior to Encounter  Medication Sig Dispense Refill  .  famotidine (PEPCID) 20 MG tablet Take 1 tablet (20 mg total) by mouth 2 (two) times daily. 30 tablet 0  . ferrous sulfate 325 (65 FE) MG tablet Take 1 tablet (325 mg total) by mouth 2 (two) times daily with a meal. 30 tablet 2  . folic acid (FOLVITE) 1 MG tablet Take 1 tablet (1 mg total) by mouth daily.    . Prenatal Vit-Fe Fumarate-FA (PRENATAL MULTIVITAMIN) TABS tablet Take 1 tablet by mouth daily at 12 noon.    . fluticasone (FLONASE) 50 MCG/ACT nasal spray Place 2 sprays into both nostrils daily. (Patient not taking: Reported on 08/25/2016) 16 g 0  . sucralfate (CARAFATE) 1 GM/10ML suspension Take 10 mLs (1 g total) by mouth 4 (four) times daily -  with meals and at bedtime. (Patient not taking: Reported on 08/25/2016) 420 mL 0   No Known Allergies  I have reviewed the past Medical Hx, Surgical Hx, Social Hx, Allergies and Medications.   REVIEW OF SYSTEMS  A comprehensive ROS was negative except per HPI.   OBJECTIVE Patient Vitals for the past 24 hrs:  BP Temp Temp src Pulse Resp SpO2 Height Weight  09/21/16 2237 118/85 98.1 F (36.7 C) Oral 95 18 100 % 5\' 7"  (1.702 m) 149 lb (67.6 kg)    PHYSICAL EXAM Constitutional: Well-developed, well-nourished female in no acute distress.  Cardiovascular: normal rate, rhythm, no murmurs Respiratory: normal rate and effort. CTAB GI: Abd soft, non-tender, non-distended. Pos BS x 4 MS: Extremities nontender, no edema, normal ROM Neurologic: Alert and oriented x 4.  GU: Neg CVAT. SPECULUM EXAM: NEFG, dark red blood noted with some small clots, POC noted at external os, obtained with ring forceps and sent to pathology, appears to be fetus in sac, cervix clean, small clot passed after removal of tissue. BIMANUAL: cervix dilated to 0.5cm; uterus normal size, no adnexal tenderness or masses. No CMT.  LAB RESULTS Results for orders placed or performed during the hospital encounter of 09/21/16 (from the past 24 hour(s))  CBC     Status: Abnormal    Collection Time: 09/21/16 11:58 PM  Result Value Ref Range   WBC 5.9 4.0 - 10.5 K/uL   RBC 4.29 3.87 - 5.11 MIL/uL   Hemoglobin 10.3 (L) 12.0 - 15.0 g/dL   HCT 32.7 (L) 36.0 - 46.0 %   MCV 76.2 (L) 78.0 - 100.0 fL   MCH 24.0 (L) 26.0 - 34.0 pg   MCHC 31.5 30.0 - 36.0 g/dL   Platelets 228 150 - 400 K/uL  hCG, quantitative, pregnancy     Status: Abnormal   Collection Time: 09/21/16 11:58 PM  Result Value Ref Range   hCG, Beta Chain, Quant, S 940 (H) <5 mIU/mL    IMAGING US Ob Comp Less 14 Wks  Result Date: 08/25/2016 CLINICAL DATA:  Pregnant, abdominal cramping x2 days EXAM: OBSTETRIC <14 WK [place] AND TRANSVAGINAL OB US TECHNIQUE: Both transabdominal and transvaginal ultrasound examinations were performed for complete evaluation of the gestation as well as the maternal uterus, adnexal regions, and pelvic cul-de-sac. Transvaginal technique was performed to assess early pregnancy. COMPARISON:  None. FINDINGS: Intrauterine gestational sac: Single Yolk sac:  Not Visualized. Embryo:  Not Visualized. MSD: 3.5  mm   5 w   0  d Subchorionic hemorrhage:  None visualized. Maternal uterus/adnexae: Bilateral ovaries are within normal limits, noting a right corpus luteal cyst. Small volume fluid in the endometrial fundus. IMPRESSION: Single intrauterine gestational sac, measuring 5 weeks 0 days by mean sac diameter, without visualized yolk sac or fetal pole. Consider follow-up pelvic ultrasound in 14 days to confirm viability, as clinically warranted. Electronically Signed   By: Julian Hy M.D.   On: 08/25/2016 12:34   US Ob Transvaginal  Result Date: 09/22/2016 CLINICAL DATA:  Pregnant patient in first-trimester pregnancy with vaginal bleeding. EXAM: TRANSVAGINAL OB ULTRASOUND TECHNIQUE: Transvaginal ultrasound was performed for complete evaluation of the gestation as well as the maternal uterus, adnexal regions, and pelvic cul-de-sac. COMPARISON:  Obstetrical ultrasound 09/12/2016, 09/10/2016 FINDINGS:  Intrauterine gestational sac: Single, the gestational sac is now located in the lower uterine segment. Yolk sac:  Not Visualized. Embryo:  Visualized. Cardiac Activity: Not Visualized. CRL:   8  mm   6 w 4 d Subchorionic hemorrhage:  None visualized. Maternal uterus/adnexae: Both ovaries are visualized and normal. No adnexal mass. No pelvic free fluid. IMPRESSION: Gestational sac now in the lower uterine segment, previous fetal cardiac activity is no longer visualized. Findings meet definitive criteria for failed pregnancy. This follows SRU consensus guidelines: Diagnostic Criteria for Nonviable Pregnancy Early in the First Trimester. N  [facility] Med [id number]. Electronically Signed   By: Jeb Levering M.D.   On: 09/22/2016 00:14   US Ob Transvaginal  Result Date: 09/12/2016 CLINICAL DATA:  Bleeding, bright red blood today.  No pain EXAM: TRANSVAGINAL OB ULTRASOUND TECHNIQUE: Transvaginal ultrasound was performed for complete evaluation of the gestation as well as the maternal uterus, adnexal regions, and pelvic cul-de-sac. COMPARISON:  09/10/2016 FINDINGS: Intrauterine gestational sac: Single Yolk sac:  Visible Embryo:  Visible Cardiac Activity: Visible Heart Rate: 94 bpm MSD:   mm    w     d CRL:   6.2  mm   6 w 3 d                  [place] EDC: 05/05/2017 Subchorionic hemorrhage:  Small Maternal uterus/adnexae: Normal ovaries.  Trace free pelvic fluid. IMPRESSION: Single living intrauterine gestation measuring 6 weeks 3 days by crown-rump length. Electronically Signed   By: Andreas Newport M.D.   On: 09/12/2016 02:28   US Ob Transvaginal  Result Date: 09/10/2016 CLINICAL DATA:  First trimester pregnancy with inconclusive fetal viability. EXAM: TRANSVAGINAL OB ULTRASOUND TECHNIQUE: Transvaginal ultrasound was performed for complete evaluation of the gestation as well as the maternal uterus, adnexal regions, and pelvic cul-de-sac. COMPARISON:  08/25/2016 FINDINGS: Intrauterine gestational sac: Single Yolk  sac:  Visualized. Embryo:  Visualized. Cardiac Activity: Visualized. Heart Rate: 95 bpm CRL:   5  mm   6 w 1 d                  [place] EDC: 05/05/2017 Subchorionic hemorrhage:  None visualized. Maternal uterus/adnexae: Normal appearance of both ovaries. No mass or abnormal free fluid identified. IMPRESSION: Single living IUP measuring 6 weeks 1 day, with [place] EDC of 05/05/2017. No significant maternal uterine or adnexal abnormality identified. Electronically Signed   By: Earle Gell M.D.   On: 09/10/2016 11:32   US Ob Transvaginal  Result Date: 08/25/2016 CLINICAL DATA:  Pregnant, abdominal cramping x2 days EXAM: OBSTETRIC <14 WK [place] AND TRANSVAGINAL OB US TECHNIQUE: Both transabdominal and transvaginal ultrasound examinations were performed for complete evaluation of the gestation as well as the maternal uterus, adnexal regions, and pelvic cul-de-sac. Transvaginal technique was performed to assess early pregnancy. COMPARISON:  None. FINDINGS: Intrauterine gestational sac: Single Yolk sac:  Not Visualized. Embryo:  Not Visualized. MSD: 3.5  mm   5 w   0  d Subchorionic hemorrhage:  None visualized. Maternal uterus/adnexae: Bilateral ovaries are within normal limits, noting a right corpus luteal cyst. Small volume fluid in the endometrial fundus. IMPRESSION: Single intrauterine gestational sac, measuring 5 weeks 0 days by mean sac diameter, without visualized yolk sac or fetal pole. Consider follow-up pelvic ultrasound in 14 days to confirm viability, as clinically warranted. Electronically Signed   By: Julian Hy M.D.   On: 08/25/2016 12:34    MAU COURSE ABO/Rh - O POS CBC - Hb 10.3 BHCG - 2792 > 940 GC/CT - pending Wet prep - neg Specimen sent to pathology  MDM Plan of care reviewed with patient, including labs and tests ordered and medical treatment. Discussed results today of bloodwork, imaging, and missed AB. Reviewed all options for patient regarding treatment of missed AB, including: expectant  management, medical management with cytotec, and surgical management. Given the passage of POC, and patient's desire to not have surgery, she elected to do the cytotec/medical management. She has a known history of anemia with blood tranfusion, but her Hb today is 10.3.  Discussed bleeding concerns with the patient when to return to MAU if severe/symptomatic. Patient does not have a history of ulcers, used to take carafate for GERD. Message sent to clinic for check up in 2 weeks. Warning signs/symptoms for immediate return to MAU given.    ASSESSMENT 1. Missed abortion   2. Vaginal bleeding affecting early pregnancy     PLAN Discharge home in stable condition. Cytotec Percocet Phenergan IBuprofen Follow up in clinic in 2 weeks   Maternity Admissions Unit Cytotec for Early Pregnancy Failure Checklist  Baseline Labs and Diagnostics . CBC . Ultrasound  . Quantitative BHCG . ABO/Rh  Standard Methods of Cytotec Administration:  MAU provider will prescribed ONE method o Cytotec 800 mcg: intravaginal (preferred)  to be administered by patient at home  o Cytotec 800 mcg: intravaginal (preferred)  to be administered by RN while in MAU o Cytotec 800 mcg: rectally to be administered by patient at home o Cytotec 800 mcg: rectally  to be administered by RN while in MAU o Cytotec 600 mcg: orally to be administered by RN while in MAU o Cytotec 600 mcg: orally to be administered by patient at home  Additional Medications  o Ibuprofen  o Percocet or Vicodin o Phenergan o Rhophylac if indicated  MAU provider Patient counseling regarding all appropriate treatment options  MAU provider Assure patient is reliable for self-administration and/or return for unexpected bleeding, pain, and follow up visit  MAU provider Patient counseling regarding Cytotec expected action, self-administration, expected bleeding and discomfort, side effects.  Instruct patient if bleeding has not occurred in 12 hours  another dose may be repeated at home (max 1600 mcg/24hrs).  If patient still has not bled within one week instruct them to call the clinic for repeat RX - [phone]  MAU provider Instructions for problems/emergencies prior to GYN clinic appointment  MAU provider Patient education on pain medication, anti-inflammatories and/or anti-emetics  MAU provider if Patient will self-administer Obtain informed consent for "Self-administration of Cytotec for management of early pregnancy failure"   MAU if RN to administer Obtain informed consent for "Administration of Cytotec for management of early pregnancy failure"  MAU provider Order Rhophylac as indicated  MAU provider Communicate with GYN clinic for 2-week follow up visit   MAU RN Witness informed consent  MAU RN Administer Cytotec as ordered (if patient does not want to self-administer Cytotec)  MAU RN Administer Rhophylac as ordered  MAU RN Use Teach Back-Ask Me 3-- Cytotec actions, bleeding, pain, follow-up, when to repeat cytotec, when to call clinic, and when to return to MAU prior to GYN clinic visit            Allergies as of 09/22/2016   No Known Allergies     Medication List    STOP taking these medications   fluticasone 50 MCG/ACT nasal spray Commonly known as:  FLONASE   sucralfate 1 GM/10ML suspension Commonly known as:  CARAFATE     TAKE these medications   famotidine 20 MG tablet Commonly known as:  PEPCID Take 1 tablet (20 mg total) by mouth 2 (two) times daily.   ferrous sulfate 325 (65 FE) MG tablet Take 1 tablet (325 mg total) by mouth 2 (two) times daily with a meal.   folic acid 1 MG tablet Commonly known as:  FOLVITE Take 1 tablet (1 mg total) by mouth daily.   ibuprofen 600 MG tablet Commonly known as:  ADVIL,MOTRIN Take 1 tablet (600 mg total) by mouth every 6 (  six) hours as needed.   misoprostol 200 MCG tablet Commonly known as:  CYTOTEC Take 3 tablets (600 mcg total) by mouth once. Let dissolve  in cheeks before swallowing. May repeat in 24 hours if has no effect.   oxyCODONE-acetaminophen 5-325 MG tablet Commonly known as:  ROXICET Take 1-2 tablets by mouth every 4 (four) hours as needed for severe pain.   prenatal multivitamin Tabs tablet Take 1 tablet by mouth daily at 12 noon.   promethazine 12.5 MG tablet Commonly known as:  PHENERGAN Take 1-2 tablets as needed every 6 hours for nausea and vomiting.        Katherine Basset, DO OB Fellow 09/22/2016 1:59 AM

## 2016-09-24 ENCOUNTER — Ambulatory Visit (HOSPITAL_COMMUNITY): Payer: Medicaid Other

## 2016-09-24 ENCOUNTER — Ambulatory Visit: Payer: Medicaid Other

## 2016-10-07 ENCOUNTER — Encounter: Payer: Self-pay | Admitting: Advanced Practice Midwife

## 2016-10-07 ENCOUNTER — Ambulatory Visit (INDEPENDENT_AMBULATORY_CARE_PROVIDER_SITE_OTHER): Payer: Medicaid Other | Admitting: Clinical

## 2016-10-07 ENCOUNTER — Ambulatory Visit (INDEPENDENT_AMBULATORY_CARE_PROVIDER_SITE_OTHER): Payer: Medicaid Other | Admitting: Advanced Practice Midwife

## 2016-10-07 VITALS — BP 131/84 | HR 70 | Ht 67.0 in | Wt 150.2 lb

## 2016-10-07 DIAGNOSIS — O021 Missed abortion: Secondary | ICD-10-CM | POA: Diagnosis not present

## 2016-10-07 DIAGNOSIS — F419 Anxiety disorder, unspecified: Secondary | ICD-10-CM

## 2016-10-07 DIAGNOSIS — F4322 Adjustment disorder with anxiety: Secondary | ICD-10-CM

## 2016-10-07 NOTE — BH Specialist Note (Signed)
Integrated Behavioral Health Initial Visit  MRN: [id number] Name: Katie Powell   Session Start time: 2:30 Session End time: 2:54 Total time: 30 minutes  Type of Service: [place] Interpretor:No. Interpretor Name and Language: n/a   Warm Hand Off Completed.       SUBJECTIVE: Katie Powell is a 26 y.o. female accompanied by patient and sister. Patient was referred by Hansel Feinstein, CNM for anxiety, depression. Patient reports the following symptoms/concerns: Pt states experiencing recent panic attacks; open to learning strategies to cope with related anxiety. Duration of problem: One month; Severity of problem: moderate  OBJECTIVE: Mood: Anxious and Affect: Appropriate Risk of harm to self or others: No plan to harm self or others   LIFE CONTEXT: Family and Social: Lives with sister; has 2 sisters, 1 brother, mother passes away 2 years ago, family very supportive School/Work: Working Therapist, occupational: Since panic attacks began, difficulty falling asleep Life Changes: Recent pregnancy  GOALS ADDRESSED: Patient will reduce symptoms of: anxiety and increase knowledge and/or ability of: self-management skills and also: Increase healthy adjustment to current life circumstances   INTERVENTIONS: Mindfulness or Relaxation Training and Psychoeducation and/or Health Education  Standardized Assessments completed: GAD-7 and PHQ 9  ASSESSMENT: Patient currently experiencing Adjustment disorder with anxious mood. Patient may benefit from psychoeducation and brief therapeutic interventions regarding coping with symptoms of anxiety.  PLAN: 1. Follow up with behavioral health clinician on : One month, if symptoms persist 2. Behavioral recommendations:  -Practice CALM relaxation breathing exercise every morning -Use apps as additional self-coping strategy -Read educational material regarding coping with symptoms of anxiety and panic  attacks 3. Referral(s): [facility] [employer] [facility] (In Clinic)  Caroleen Hamman [place], [place]   Depression screen [facility] 2/9 10/07/2016 10/09/2014  Decreased Interest 1 0  Down, Depressed, Hopeless 0 0  PHQ - 2 Score 1 0  Altered sleeping 3 -  Tired, decreased energy 3 -  Change in appetite 2 -  Feeling bad or failure about yourself  0 -  Trouble concentrating 1 -  Moving slowly or fidgety/restless 0 -  Suicidal thoughts 0 -  PHQ-9 Score 10 -   GAD 7 : Generalized Anxiety Score 10/07/2016  Nervous, Anxious, on Edge 3  Control/stop worrying 2  Worry too much - different things 2  Trouble relaxing 3  Restless 3  Easily annoyed or irritable 3  Afraid - awful might happen 2  Total GAD 7 Score 18

## 2016-10-07 NOTE — Progress Notes (Signed)
Subjective:     Patient ID: Katie Powell, female   DOB: Jul 31, 1990, 26 y.o.   MRN: [id number]  HPI THis is a 26 y.o. female who is 2 weeks post missed abortion. She was seen last in MAU on 09/22/16 with bleeding and cramping. POC tissue was removed.  She was given Cytotec but states had little cramping or bleeding after taking it.  "I think it was all out by then".  She C/O anxiety symptoms.  States has panic attacks. Confirmed by sister who is here.  Wants birth control, thinks DepoProvera. Has not had IC yet Review of Systems  Constitutional: Negative for appetite change, chills and fever.  Respiratory: Negative for shortness of breath.   Gastrointestinal: Negative for abdominal pain, nausea and vomiting.  Genitourinary: Negative for dysuria and vaginal bleeding.       Objective:   Physical Exam  Constitutional: She is oriented to person, place, and time. She appears well-developed and well-nourished. No distress.  HENT:  Head: Normocephalic.  Neck: Normal range of motion. Neck supple.  Cardiovascular: Normal rate and regular rhythm.  Exam reveals no gallop and no friction rub.   No murmur heard. Pulmonary/Chest: Effort normal and breath sounds normal. No respiratory distress.  Abdominal: Soft. She exhibits no distension. There is no tenderness. There is no rebound and no guarding.  Genitourinary: Vaginal discharge: declined by patient.  Musculoskeletal: Normal range of motion.  Neurological: She is alert and oriented to person, place, and time.  Skin: Skin is warm and dry.  Psychiatric: She has a normal mood and affect.       Assessment:     S/P Spontaneous Abortion Wants DepoProvera Anemia    Plan:     Will check HCG level for completeness of SAB Will check CBC for anemia hx Will Rx Depo Provera for after HCG goes to 0 Will see Jaimie today for counseling

## 2016-10-07 NOTE — Patient Instructions (Signed)
Medroxyprogesterone injection [Contraceptive] What is this medicine? MEDROXYPROGESTERONE (me DROX ee proe JES te rone) contraceptive injections prevent pregnancy. They provide effective birth control for 3 months. Depo-subQ Provera 104 is also used for treating pain related to endometriosis. This medicine may be used for other purposes; ask your health care provider or pharmacist if you have questions. COMMON BRAND NAME(S): Depo-Provera, Depo-subQ Provera 104 What should I tell my health care provider before I take this medicine? They need to know if you have any of these conditions: -frequently drink alcohol -asthma -blood vessel disease or a history of a blood clot in the lungs or legs -bone disease such as osteoporosis -breast cancer -diabetes -eating disorder (anorexia nervosa or bulimia) -high blood pressure -HIV infection or AIDS -kidney disease -liver disease -mental depression -migraine -seizures (convulsions) -stroke -tobacco smoker -vaginal bleeding -an unusual or allergic reaction to medroxyprogesterone, other hormones, medicines, foods, dyes, or preservatives -pregnant or trying to get pregnant -breast-feeding How should I use this medicine? Depo-Provera Contraceptive injection is given into a muscle. Depo-subQ Provera 104 injection is given under the skin. These injections are given by a health care professional. You must not be pregnant before getting an injection. The injection is usually given during the first 5 days after the start of a menstrual period or 6 weeks after delivery of a baby. Talk to your pediatrician regarding the use of this medicine in children. Special care may be needed. These injections have been used in female children who have started having menstrual periods. Overdosage: If you think you have taken too much of this medicine contact a poison control center or emergency room at once. NOTE: This medicine is only for you. Do not share this medicine  with others. What if I miss a dose? Try not to miss a dose. You must get an injection once every 3 months to maintain birth control. If you cannot keep an appointment, call and reschedule it. If you wait longer than 13 weeks between Depo-Provera contraceptive injections or longer than 14 weeks between Depo-subQ Provera 104 injections, you could get pregnant. Use another method for birth control if you miss your appointment. You may also need a pregnancy test before receiving another injection. What may interact with this medicine? Do not take this medicine with any of the following medications: -bosentan This medicine may also interact with the following medications: -aminoglutethimide -antibiotics or medicines for infections, especially rifampin, rifabutin, rifapentine, and griseofulvin -aprepitant -barbiturate medicines such as phenobarbital or primidone -bexarotene -carbamazepine -medicines for seizures like ethotoin, felbamate, oxcarbazepine, phenytoin, topiramate -modafinil -St. John's wort This list may not describe all possible interactions. Give your health care provider a list of all the medicines, herbs, non-prescription drugs, or dietary supplements you use. Also tell them if you smoke, drink alcohol, or use illegal drugs. Some items may interact with your medicine. What should I watch for while using this medicine? This drug does not protect you against HIV infection (AIDS) or other sexually transmitted diseases. Use of this product may cause you to lose calcium from your bones. Loss of calcium may cause weak bones (osteoporosis). Only use this product for more than 2 years if other forms of birth control are not right for you. The longer you use this product for birth control the more likely you will be at risk for weak bones. Ask your health care professional how you can keep strong bones. You may have a change in bleeding pattern or irregular periods. Many females stop having    periods while taking this drug. If you have received your injections on time, your chance of being pregnant is very low. If you think you may be pregnant, see your health care professional as soon as possible. Tell your health care professional if you want to get pregnant within the next year. The effect of this medicine may last a long time after you get your last injection. What side effects may I notice from receiving this medicine? Side effects that you should report to your doctor or health care professional as soon as possible: -allergic reactions like skin rash, itching or hives, swelling of the face, lips, or tongue -breast tenderness or discharge -breathing problems -changes in vision -depression -feeling faint or lightheaded, falls -fever -pain in the abdomen, chest, groin, or leg -problems with balance, talking, walking -unusually weak or tired -yellowing of the eyes or skin Side effects that usually do not require medical attention (report to your doctor or health care professional if they continue or are bothersome): -acne -fluid retention and swelling -headache -irregular periods, spotting, or absent periods -temporary pain, itching, or skin reaction at site where injected -weight gain This list may not describe all possible side effects. Call your doctor for medical advice about side effects. You may report side effects to FDA at 1-800-FDA-1088. Where should I keep my medicine? This does not apply. The injection will be given to you by a health care professional. NOTE: This sheet is a summary. It may not cover all possible information. If you have questions about this medicine, talk to your doctor, pharmacist, or health care provider.  2018 Elsevier/Gold Standard (2008-05-18 18:37:56) Living With Anxiety After being diagnosed with an anxiety disorder, you may be relieved to know why you have felt or behaved a certain way. It is natural to also feel overwhelmed about the  treatment ahead and what it will mean for your life. With care and support, you can manage this condition and recover from it. How to cope with anxiety Dealing with stress  Stress is your body's reaction to life changes and events, both good and bad. Stress can last just a few hours or it can be ongoing. Stress can play a major role in anxiety, so it is important to learn both how to cope with stress and how to think about it differently. Talk with your health care provider or a counselor to learn more about stress reduction. He or she may suggest some stress reduction techniques, such as:  Music therapy. This can include creating or listening to music that you enjoy and that inspires you.  Mindfulness-based meditation. This involves being aware of your normal breaths, rather than trying to control your breathing. It can be done while sitting or walking.  Centering prayer. This is a kind of meditation that involves focusing on a word, phrase, or sacred image that is meaningful to you and that brings you peace.  Deep breathing. To do this, expand your stomach and inhale slowly through your nose. Hold your breath for 3-5 seconds. Then exhale slowly, allowing your stomach muscles to relax.  Self-talk. This is a skill where you identify thought patterns that lead to anxiety reactions and correct those thoughts.  Muscle relaxation. This involves tensing muscles then relaxing them. Choose a stress reduction technique that fits your lifestyle and personality. Stress reduction techniques take time and practice. Set aside 5-15 minutes a day to do them. Therapists can offer training in these techniques. The training may be covered by some  insurance plans. Other things you can do to manage stress include:  Keeping a stress diary. This can help you learn what triggers your stress and ways to control your response.  Thinking about how you respond to certain situations. You may not be able to control  everything, but you can control your reaction.  Making time for activities that help you relax, and not feeling guilty about spending your time in this way. Therapy combined with coping and stress-reduction skills provides the best chance for successful treatment. Medicines  Medicines can help ease symptoms. Medicines for anxiety include:  Anti-anxiety drugs.  Antidepressants.  Beta-blockers. Medicines may be used as the main treatment for anxiety disorder, along with therapy, or if other treatments are not working. Medicines should be prescribed by a health care provider. Relationships  Relationships can play a big part in helping you recover. Try to spend more time connecting with trusted friends and family members. Consider going to couples counseling, taking family education classes, or going to family therapy. Therapy can help you and others better understand the condition. How to recognize changes in your condition Everyone has a different response to treatment for anxiety. Recovery from anxiety happens when symptoms decrease and stop interfering with your daily activities at home or work. This may mean that you will start to:  Have better concentration and focus.  Sleep better.  Be less irritable.  Have more energy.  Have improved memory. It is important to recognize when your condition is getting worse. Contact your health care provider if your symptoms interfere with home or work and you do not feel like your condition is improving. Where to find help and support: You can get help and support from these sources:  Self-help groups.  Online and [name].  A trusted spiritual leader.  Couples counseling.  Family education classes.  Family therapy. Follow these instructions at home:  Eat a healthy diet that includes plenty of vegetables, fruits, whole grains, low-fat dairy products, and lean protein. Do not eat a lot of foods that are high in solid  fats, added sugars, or salt.  Exercise. Most adults should do the following:  Exercise for at least 150 minutes each week. The exercise should increase your heart rate and make you sweat (moderate-intensity exercise).  Strengthening exercises at least twice a week.  Cut down on caffeine, tobacco, alcohol, and other potentially harmful substances.  Get the right amount and quality of sleep. Most adults need 7-9 hours of sleep each night.  Make choices that simplify your life.  Take over-the-counter and prescription medicines only as told by your health care provider.  Avoid caffeine, alcohol, and certain over-the-counter cold medicines. These may make you feel worse. Ask your pharmacist which medicines to avoid.  Keep all follow-up visits as told by your health care provider. This is important. Questions to ask your health care provider  Would I benefit from therapy?  How often should I follow up with a health care provider?  How long do I need to take medicine?  Are there any long-term side effects of my medicine?  Are there any alternatives to taking medicine? Contact a health care provider if:  You have a hard time staying focused or finishing daily tasks.  You spend many hours a day feeling worried about everyday life.  You become exhausted by worry.  You start to have headaches, feel tense, or have nausea.  You urinate more than normal.  You have diarrhea. Get help right  away if:  You have a racing heart and shortness of breath.  You have thoughts of hurting yourself or others. If you ever feel like you may hurt yourself or others, or have thoughts about taking your own life, get help right away. You can go to your nearest emergency department or call:  Your local emergency services (911 in the [place]  A suicide crisis helpline, such as the [place] at (309) 497-6127. This is open 24-hours a day. Summary  Taking steps to deal with  stress can help calm you.  Medicines cannot cure anxiety disorders, but they can help ease symptoms.  Family, friends, and partners can play a big part in helping you recover from an anxiety disorder. This information is not intended to replace advice given to you by your health care provider. Make sure you discuss any questions you have with your health care provider. Document Released: 04/21/2016 Document Revised: 04/21/2016 Document Reviewed: 04/21/2016 Elsevier Interactive Patient Education  2017 [employer].

## 2016-10-08 LAB — CBC
Hematocrit: 34.4 % (ref 34.0–46.6)
Hemoglobin: 11.2 g/dL (ref 11.1–15.9)
MCH: 26.4 pg — AB (ref 26.6–33.0)
MCHC: 32.6 g/dL (ref 31.5–35.7)
MCV: 81 fL (ref 79–97)
Platelets: 283 10*3/uL (ref 150–379)
RBC: 4.25 x10E6/uL (ref 3.77–5.28)
WBC: 4.5 10*3/uL (ref 3.4–10.8)

## 2016-10-08 LAB — BETA HCG QUANT (REF LAB): hCG Quant: 1 m[IU]/mL

## 2016-10-13 ENCOUNTER — Encounter: Payer: Self-pay | Admitting: Medical

## 2016-10-14 ENCOUNTER — Ambulatory Visit (INDEPENDENT_AMBULATORY_CARE_PROVIDER_SITE_OTHER): Payer: Medicaid Other | Admitting: *Deleted

## 2016-10-14 VITALS — BP 119/77 | HR 71 | Wt 148.3 lb

## 2016-10-14 DIAGNOSIS — Z3042 Encounter for surveillance of injectable contraceptive: Secondary | ICD-10-CM | POA: Diagnosis present

## 2016-10-14 MED ORDER — MEDROXYPROGESTERONE ACETATE 150 MG/ML IM SUSP
150.0000 mg | Freq: Once | INTRAMUSCULAR | Status: AC
  Administered 2016-10-14: 150 mg via INTRAMUSCULAR

## 2016-10-14 NOTE — Addendum Note (Signed)
Addended by: Langston Reusing on: 10/14/2016 03:17 PM   Modules accepted: Miquel Dunn

## 2016-10-14 NOTE — Progress Notes (Signed)
Depo Provera 150 mg administered IM as scheduled. Next dose due 8/22-9/5.  Pt confirmed she has not had sex since pregnancy loss. She was advised to use condoms for next 2 weeks to prevent pregnancy. Pt was advised to obtain date of last Pap from [facility] and that she will need yearly Pap in order for Medicaid to cover Depo Provera injections. She voiced understanding of all information and instructions given.

## 2016-12-30 ENCOUNTER — Ambulatory Visit: Payer: Self-pay

## 2017-01-04 ENCOUNTER — Emergency Department (HOSPITAL_COMMUNITY)
Admission: EM | Admit: 2017-01-04 | Discharge: 2017-01-04 | Disposition: A | Discharge status: Home / Self Care | Payer: Medicaid Other | Source: Home / Self Care | Attending: Emergency Medicine | Admitting: Emergency Medicine

## 2017-01-04 ENCOUNTER — Encounter (HOSPITAL_COMMUNITY): Payer: Self-pay | Admitting: Emergency Medicine

## 2017-01-04 ENCOUNTER — Emergency Department (HOSPITAL_COMMUNITY)
Admission: EM | Admit: 2017-01-04 | Discharge: 2017-01-04 | Disposition: A | Discharge status: Home / Self Care | Payer: Medicaid Other | Attending: Emergency Medicine | Admitting: Emergency Medicine

## 2017-01-04 DIAGNOSIS — Z8759 Personal history of other complications of pregnancy, childbirth and the puerperium: Secondary | ICD-10-CM | POA: Insufficient documentation

## 2017-01-04 DIAGNOSIS — F17210 Nicotine dependence, cigarettes, uncomplicated: Secondary | ICD-10-CM | POA: Insufficient documentation

## 2017-01-04 DIAGNOSIS — Z862 Personal history of diseases of the blood and blood-forming organs and certain disorders involving the immune mechanism: Secondary | ICD-10-CM | POA: Insufficient documentation

## 2017-01-04 DIAGNOSIS — J029 Acute pharyngitis, unspecified: Secondary | ICD-10-CM

## 2017-01-04 DIAGNOSIS — R55 Syncope and collapse: Secondary | ICD-10-CM

## 2017-01-04 DIAGNOSIS — Z79899 Other long term (current) drug therapy: Secondary | ICD-10-CM | POA: Insufficient documentation

## 2017-01-04 DIAGNOSIS — Z793 Long term (current) use of hormonal contraceptives: Secondary | ICD-10-CM | POA: Insufficient documentation

## 2017-01-04 LAB — URINALYSIS, ROUTINE W REFLEX MICROSCOPIC
BILIRUBIN URINE: NEGATIVE
Bacteria, UA: NONE SEEN
Glucose, UA: NEGATIVE mg/dL
Ketones, ur: NEGATIVE mg/dL
LEUKOCYTES UA: NEGATIVE
NITRITE: NEGATIVE
PH: 5 (ref 5.0–8.0)
Protein, ur: NEGATIVE mg/dL
SPECIFIC GRAVITY, URINE: 1.019 (ref 1.005–1.030)

## 2017-01-04 LAB — BASIC METABOLIC PANEL
ANION GAP: 8 (ref 5–15)
BUN: 15 mg/dL (ref 6–20)
CHLORIDE: 107 mmol/L (ref 101–111)
CO2: 22 mmol/L (ref 22–32)
Calcium: 9.3 mg/dL (ref 8.9–10.3)
Creatinine, Ser: 0.72 mg/dL (ref 0.44–1.00)
GFR calc Af Amer: >60 mL/min (ref >60)
Glucose, Bld: 90 mg/dL (ref 65–99)
POTASSIUM: 3.5 mmol/L (ref 3.5–5.1)
SODIUM: 137 mmol/L (ref 135–145)

## 2017-01-04 LAB — CBC WITH DIFFERENTIAL/PLATELET
BASOS ABS: 0 10*3/uL (ref 0.0–0.1)
BASOS PCT: 0 %
EOS ABS: 0.1 10*3/uL (ref 0.0–0.7)
Eosinophils Relative: 2 %
HEMATOCRIT: 37.8 % (ref 36.0–46.0)
Hemoglobin: 12.7 g/dL (ref 12.0–15.0)
Lymphocytes Relative: 43 %
Lymphs Abs: 2.2 10*3/uL (ref 0.7–4.0)
MCH: 29.3 pg (ref 26.0–34.0)
MCHC: 33.6 g/dL (ref 30.0–36.0)
MCV: 87.1 fL (ref 78.0–100.0)
Monocytes Absolute: 0.2 10*3/uL (ref 0.1–1.0)
Monocytes Relative: 4 %
NEUTROS ABS: 2.6 10*3/uL (ref 1.7–7.7)
NEUTROS PCT: 51 %
Platelets: 215 10*3/uL (ref 150–400)
RBC: 4.34 MIL/uL (ref 3.87–5.11)
RDW: 13.1 % (ref 11.5–15.5)
WBC: 5.1 10*3/uL (ref 4.0–10.5)

## 2017-01-04 LAB — RAPID STREP SCREEN (MED CTR MEBANE ONLY): Streptococcus, Group A Screen (Direct): NEGATIVE

## 2017-01-04 LAB — HCG, QUANTITATIVE, PREGNANCY: HCG, BETA CHAIN, QUANT, S: 1 m[IU]/mL (ref <5)

## 2017-01-04 NOTE — Discharge Instructions (Signed)
Drink plenty of fluids.  Take tylenol as needed for sore throat.  Return if you are worse at any time.

## 2017-01-04 NOTE — ED Notes (Signed)
Called for triage x1.

## 2017-01-04 NOTE — ED Notes (Signed)
Called Main lab, they are adding on Hcg quantitative

## 2017-01-04 NOTE — ED Triage Notes (Signed)
Pt reports having sore throat and feeling like it is hard to swallow since last night. Pt in no acute distress at this time.

## 2017-01-04 NOTE — ED Triage Notes (Signed)
Pt arrives via EMS from her work at [employer] went to [facility] ER  Earlier today for sore throat, she left went to work and had a syncopal episoder. States has hx of anemia

## 2017-01-04 NOTE — ED Notes (Signed)
Pt called for room and pt not found in lobby. Pt called x 3

## 2017-01-04 NOTE — ED Notes (Signed)
Pt c/o throat pain and difficulty swallowing onset today. Hx of pharyngitis.

## 2017-01-04 NOTE — ED Notes (Signed)
No answer for triage.

## 2017-01-04 NOTE — ED Provider Notes (Signed)
[facility] DEPT Provider Note   CSN: [id number] Arrival date & time: 01/04/17  1308     History   Chief Complaint Chief Complaint  Patient presents with  . Sore Throat  . Dizziness    HPI Katie Powell is a 26 y.o. female.  HPI  26 year old female presents today stating she's had a sore throat for 2 days. She was seen at [facility] and had a strep screen done. She left prior to getting her results. She states that she has had pain with swallowing and has had decreased by mouth intake secondary to this. She denies fever, chills, nausea, vomiting, diarrhea, cough, nasal congestion, or exposure to known infection. She states that today while she was at work she got lightheaded and had a near-syncopal and a syncopal episode. She states that she has passed out in the past. She has previously been anemic. She denies any recent symptoms of her anemia. She states that she was given a shot for birth control after a miscarriage in May. She was supposed to go back and have a repeat shock did not have that done. T.intermittent bleeding.  Past Medical History:  Diagnosis Date  . Anemia     Patient Active Problem List   Diagnosis Date Noted  . Missed abortion 09/22/2016  . Symptomatic anemia 08/20/2016  . Positive pregnancy test 08/20/2016  . Iron deficiency anemia due to chronic blood loss 08/20/2016  . Thrombocytosis ([place]) 08/20/2016    Past Surgical History:  Procedure Laterality Date  . FINGER SURGERY      OB History    Gravida Para Term Preterm AB Living   1       0     SAB TAB Ectopic Multiple Live Births     0             Home Medications    Prior to Admission medications   Medication Sig Start Date End Date Taking? Authorizing Provider  famotidine (PEPCID) 20 MG tablet Take 1 tablet (20 mg total) by mouth 2 (two) times daily. 08/21/16   Carlisle Cater, PA-C  ferrous sulfate 325 (65 FE) MG tablet Take 1 tablet (325 mg total) by mouth 2 (two) times daily  with a meal. 10/24/14   Sciacca, Marissa, PA-C  folic acid (FOLVITE) 1 MG tablet Take 1 tablet (1 mg total) by mouth daily. Patient not taking: Reported on 10/07/2016 08/20/16   Verlee Monte, MD  ibuprofen (ADVIL,MOTRIN) 600 MG tablet Take 1 tablet (600 mg total) by mouth every 6 (six) hours as needed. Patient not taking: Reported on 10/07/2016 09/22/16   Mumaw, Lauralyn Primes, DO  misoprostol (CYTOTEC) 200 MCG tablet Take 3 tablets (600 mcg total) by mouth once. Let dissolve in cheeks before swallowing. May repeat in 24 hours if has no effect. 09/22/16 09/22/16  Mumaw, Lauralyn Primes, DO  oxyCODONE-acetaminophen (ROXICET) 5-325 MG tablet Take 1-2 tablets by mouth every 4 (four) hours as needed for severe pain. Patient not taking: Reported on 10/07/2016 09/22/16   Mumaw, Lauralyn Primes, DO  Prenatal Vit-Fe Fumarate-FA (PRENATAL MULTIVITAMIN) TABS tablet Take 1 tablet by mouth daily at 12 noon.    [provider]  promethazine (PHENERGAN) 12.5 MG tablet Take 1-2 tablets as needed every 6 hours for nausea and vomiting. Patient not taking: Reported on 10/07/2016 09/22/16   Mumaw, Lauralyn Primes, DO    Family History No family history on file.  Social History Social History  Substance Use Topics  . Smoking status:  Current Every Day Smoker    Packs/day: 0.50    Types: Cigarettes    Last attempt to quit: 09/14/2016  . Smokeless tobacco: Never Used  . Alcohol use Yes     Comment: occasionally     Allergies   Patient has no known allergies.   Review of Systems Review of Systems   Physical Exam Updated Vital Signs BP 112/79 (BP Location: Right Arm)   Pulse 94   Temp 98.6 F (37 C) (Oral)   Resp 18   Ht 1.676 m (5\' 6" )   Wt 68 kg (150 lb)   LMP 01/04/2017   SpO2 100%   BMI 24.21 kg/m   Physical Exam   ED Treatments / Results  Labs (all labs ordered are listed, but only abnormal results are displayed) Labs Reviewed  CBC WITH DIFFERENTIAL/PLATELET  BASIC  METABOLIC PANEL  HCG, QUANTITATIVE, PREGNANCY  URINALYSIS, ROUTINE W REFLEX MICROSCOPIC    EKG  EKG Interpretation None       Radiology No results found.  Procedures Procedures (including critical care time)  Medications Ordered in ED Medications - No data to display   Initial Impression / Assessment and Plan / ED Course  I have reviewed the triage vital signs and the nursing notes.  Pertinent labs & imaging results that were available during my care of the patient were reviewed by me and considered in my medical decision making (see chart for details).   26 year old female presents today with sore throat. She was seen yesterday with a negative strep test. She reports that she had a syncopal episode today. She also reports a history of anemia. Labs checked here no evidence of anemia. She is not orthostatic. She is stable time. We discussed oral hydration and symptomatic treatment to voices understanding.  Discussed return precautions and she voices understanding.    Final Clinical Impressions(s) / ED Diagnoses   Final diagnoses:  Sore throat  Syncope, unspecified syncope type    New Prescriptions New Prescriptions   No medications on file     Pattricia Boss, MD 01/04/17 1702

## 2017-01-06 LAB — CULTURE, GROUP A STREP (THRC)

## 2017-01-10 ENCOUNTER — Encounter (HOSPITAL_COMMUNITY): Payer: Self-pay | Admitting: Emergency Medicine

## 2017-01-10 DIAGNOSIS — F410 Panic disorder [episodic paroxysmal anxiety] without agoraphobia: Secondary | ICD-10-CM | POA: Diagnosis not present

## 2017-01-10 DIAGNOSIS — F17210 Nicotine dependence, cigarettes, uncomplicated: Secondary | ICD-10-CM | POA: Diagnosis not present

## 2017-01-10 DIAGNOSIS — Z79899 Other long term (current) drug therapy: Secondary | ICD-10-CM | POA: Diagnosis not present

## 2017-01-10 DIAGNOSIS — R0602 Shortness of breath: Secondary | ICD-10-CM | POA: Diagnosis present

## 2017-01-10 NOTE — ED Triage Notes (Addendum)
Pt mother reports that pt began having chest pain and shortness of breath 58min prior to arrival. Pt sitting in wheelchair with increased respiratory rate and having episodes of not responding to verbal or painful stimuli. Lung sounds clear no airway obstruction.

## 2017-01-10 NOTE — ED Notes (Signed)
Pt called for triage no response from lobby

## 2017-01-11 ENCOUNTER — Emergency Department (HOSPITAL_COMMUNITY)
Admission: EM | Admit: 2017-01-11 | Discharge: 2017-01-11 | Disposition: A | Discharge status: Home / Self Care | Payer: Medicaid Other | Attending: Emergency Medicine | Admitting: Emergency Medicine

## 2017-01-11 ENCOUNTER — Emergency Department (HOSPITAL_COMMUNITY): Payer: Medicaid Other

## 2017-01-11 DIAGNOSIS — F410 Panic disorder [episodic paroxysmal anxiety] without agoraphobia: Secondary | ICD-10-CM

## 2017-01-11 DIAGNOSIS — R0602 Shortness of breath: Secondary | ICD-10-CM

## 2017-01-11 LAB — CBC WITH DIFFERENTIAL/PLATELET
Basophils Absolute: 0 10*3/uL (ref 0.0–0.1)
Basophils Relative: 0 %
EOS ABS: 0.1 10*3/uL (ref 0.0–0.7)
EOS PCT: 3 %
HCT: 37.1 % (ref 36.0–46.0)
HEMOGLOBIN: 12.9 g/dL (ref 12.0–15.0)
LYMPHS ABS: 2.8 10*3/uL (ref 0.7–4.0)
Lymphocytes Relative: 59 %
MCH: 29.6 pg (ref 26.0–34.0)
MCHC: 34.8 g/dL (ref 30.0–36.0)
MCV: 85.1 fL (ref 78.0–100.0)
MONOS PCT: 5 %
Monocytes Absolute: 0.2 10*3/uL (ref 0.1–1.0)
NEUTROS PCT: 33 %
Neutro Abs: 1.6 10*3/uL — ABNORMAL LOW (ref 1.7–7.7)
PLATELETS: 216 10*3/uL (ref 150–400)
RBC: 4.36 MIL/uL (ref 3.87–5.11)
RDW: 13 % (ref 11.5–15.5)
WBC: 4.7 10*3/uL (ref 4.0–10.5)

## 2017-01-11 LAB — BASIC METABOLIC PANEL
Anion gap: 7 (ref 5–15)
BUN: 8 mg/dL (ref 6–20)
CO2: 24 mmol/L (ref 22–32)
CREATININE: 0.72 mg/dL (ref 0.44–1.00)
Calcium: 9.5 mg/dL (ref 8.9–10.3)
Chloride: 106 mmol/L (ref 101–111)
GFR calc Af Amer: >60 mL/min (ref >60)
Glucose, Bld: 99 mg/dL (ref 65–99)
Potassium: 3.3 mmol/L — ABNORMAL LOW (ref 3.5–5.1)
SODIUM: 137 mmol/L (ref 135–145)

## 2017-01-11 LAB — I-STAT TROPONIN, ED: TROPONIN I, POC: 0 ng/mL (ref 0.00–0.08)

## 2017-01-11 LAB — D-DIMER, QUANTITATIVE: D-Dimer, Quant: <0.27 ug/mL-FEU (ref 0.00–0.50)

## 2017-01-11 MED ORDER — LORAZEPAM 1 MG PO TABS
1.0000 mg | ORAL_TABLET | Freq: Once | ORAL | Status: AC
  Administered 2017-01-11: 1 mg via ORAL
  Filled 2017-01-11: qty 1

## 2017-01-11 MED ORDER — LORAZEPAM 1 MG PO TABS: 1.0000 mg | ORAL_TABLET | Freq: Three times a day (TID) | ORAL | 0 refills | Status: DC | PRN

## 2017-01-11 NOTE — ED Provider Notes (Signed)
[facility] DEPT Provider Note   CSN: [id number] Arrival date & time: 01/10/17  2338     History   Chief Complaint Chief Complaint  Patient presents with  . Shortness of Breath  . Altered Mental Status    HPI Katie Powell is a 26 y.o. female.  HPI  This is a 25 year old female who presents with her mother with concerns for shortness of breath and chest discomfort. Mother reports onset of symptoms 20 minutes prior to arrival. Patient is fairly noncontributory to history taking.  She is very tearful and will only speak in very short phrases. She does endorse that her chest feels pressure and she felt short of breath. Mother states that she has had similar episodes in the past and she questions anxiety. No new stressors that she is aware of. However, she does state that it is almost the anniversary of her grandmother's death.  Past Medical History:  Diagnosis Date  . Anemia     Patient Active Problem List   Diagnosis Date Noted  . Missed abortion 09/22/2016  . Symptomatic anemia 08/20/2016  . Positive pregnancy test 08/20/2016  . Iron deficiency anemia due to chronic blood loss 08/20/2016  . Thrombocytosis ([place]) 08/20/2016    Past Surgical History:  Procedure Laterality Date  . FINGER SURGERY      OB History    Gravida Para Term Preterm AB Living   1       0     SAB TAB Ectopic Multiple Live Births     0             Home Medications    Prior to Admission medications   Medication Sig Start Date End Date Taking? Authorizing Provider  famotidine (PEPCID) 20 MG tablet Take 1 tablet (20 mg total) by mouth 2 (two) times daily. 08/21/16  Yes Carlisle Cater, PA-C  ferrous sulfate 325 (65 FE) MG tablet Take 1 tablet (325 mg total) by mouth 2 (two) times daily with a meal. 10/24/14  Yes Sciacca, Marissa, PA-C  folic acid (FOLVITE) 1 MG tablet Take 1 tablet (1 mg total) by mouth daily. Patient not taking: Reported on 10/07/2016 08/20/16   Verlee Monte, MD  ibuprofen  (ADVIL,MOTRIN) 600 MG tablet Take 1 tablet (600 mg total) by mouth every 6 (six) hours as needed. Patient not taking: Reported on 10/07/2016 09/22/16   Mumaw, Lauralyn Primes, DO  LORazepam (ATIVAN) 1 MG tablet Take 1 tablet (1 mg total) by mouth every 8 (eight) hours as needed for anxiety. 01/11/17   Horton, Barbette Hair, MD  oxyCODONE-acetaminophen (ROXICET) 5-325 MG tablet Take 1-2 tablets by mouth every 4 (four) hours as needed for severe pain. Patient not taking: Reported on 10/07/2016 09/22/16   Mumaw, Lauralyn Primes, DO  promethazine (PHENERGAN) 12.5 MG tablet Take 1-2 tablets as needed every 6 hours for nausea and vomiting. Patient not taking: Reported on 10/07/2016 09/22/16   Mumaw, Lauralyn Primes, DO    Family History History reviewed. No pertinent family history.  Social History Social History  Substance Use Topics  . Smoking status: Current Every Day Smoker    Packs/day: 0.50    Types: Cigarettes    Last attempt to quit: 09/14/2016  . Smokeless tobacco: Never Used  . Alcohol use Yes     Comment: occasionally     Allergies   Patient has no known allergies.   Review of Systems Review of Systems  Unable to perform ROS: Other  Constitutional: Negative for fever.  Respiratory: Positive for shortness of breath. Negative for cough.   Cardiovascular: Positive for chest pain.  Psychiatric/Behavioral: The patient is nervous/anxious.   All other systems reviewed and are negative.  limited secondary to patient's cooperation with history taking   Physical Exam Updated Vital Signs BP 113/83   Pulse (!) 51   Temp 99 F (37.2 C) (Oral)   Resp 18   Ht 5\' 6"  (1.676 m)   Wt 68 kg (150 lb)   LMP 01/04/2017   SpO2 98%   BMI 24.21 kg/m   Physical Exam  Constitutional: She is oriented to person, place, and time. She appears well-developed and well-nourished. No distress.  Tearful  HENT:  Head: Normocephalic and atraumatic.  Cardiovascular: Normal rate, regular rhythm  and normal heart sounds.   Pulmonary/Chest: Effort normal and breath sounds normal. No respiratory distress. She has no wheezes.  While in room, patient observed to have intermittent episodes of tachypnea followed by episodes of decreased arousal and decreased respiratory rate, airway intact for entire duration  Abdominal: Soft. Bowel sounds are normal. There is no tenderness. There is no guarding.  Musculoskeletal: She exhibits no edema.  Neurological: She is alert and oriented to person, place, and time.  Skin: Skin is warm and dry.  Psychiatric: She has a normal mood and affect.  Nursing note and vitals reviewed.    ED Treatments / Results  Labs (all labs ordered are listed, but only abnormal results are displayed) Labs Reviewed  CBC WITH DIFFERENTIAL/PLATELET - Abnormal; Notable for the following:       Result Value   Neutro Abs 1.6 (*)    All other components within normal limits  BASIC METABOLIC PANEL - Abnormal; Notable for the following:    Potassium 3.3 (*)    All other components within normal limits  D-DIMER, QUANTITATIVE (NOT AT [facility])  I-STAT TROPONIN, ED    EKG  EKG Interpretation  Date/Time:  Monday January 11 2017 00:49:21 EDT Ventricular Rate:  70 PR Interval:    QRS Duration: 83 QT Interval:  414 QTC Calculation: 447 R Axis:   64 Text Interpretation:  Sinus rhythm Borderline T abnormalities, anterior leads No significant change since last tracing Confirmed by Thayer Jew [phone]) on 01/11/2017 2:20:16 AM       Radiology Dg Chest 2 View  Result Date: 01/11/2017 CLINICAL DATA:  Chest pain and dyspnea, 20 minutes duration. EXAM: CHEST  2 VIEW COMPARISON:  08/20/2016 FINDINGS: The lungs are clear. The pulmonary vasculature is normal. Heart size is normal. Hilar and mediastinal contours are unremarkable. There is no pleural effusion. IMPRESSION: No active cardiopulmonary disease. Electronically Signed   By: Andreas Newport M.D.   On: 01/11/2017 01:21     Procedures Procedures (including critical care time)  Medications Ordered in ED Medications  LORazepam (ATIVAN) tablet 1 mg (1 mg Oral Given 01/11/17 0136)     Initial Impression / Assessment and Plan / ED Course  I have reviewed the triage vital signs and the nursing notes.  Pertinent labs & imaging results that were available during my care of the patient were reviewed by me and considered in my medical decision making (see chart for details).     Patient with shortness of breath and chest pain. Limited history from patient. Just by physical exam, she appears very anxious and has intermittent episodes of tachypnea followed by minimal arousable and decreased respiratory rate. She is tearful. Workup initiated. Workup including EKG, d-dimer, chest x-ray is reassuring. She was  given Ativan for concerns for panic attack.  2:36 AM On repeat evaluation, patient is awake and states that she feels much better. She is clearly conversant and in no respiratory distress. Discussed the workup with the patient and her mother. Question anxiety component. PCP follow-up recommended for mood stabilization. Will provide a very short course of Ativan if patient has recurrence of symptoms.  After history, exam, and medical workup I feel the patient has been appropriately medically screened and is safe for discharge home. Pertinent diagnoses were discussed with the patient. Patient was given return precautions.   Final Clinical Impressions(s) / ED Diagnoses   Final diagnoses:  Shortness of breath  Panic attack    New Prescriptions New Prescriptions   LORAZEPAM (ATIVAN) 1 MG TABLET    Take 1 tablet (1 mg total) by mouth every 8 (eight) hours as needed for anxiety.     Merryl Hacker, MD 01/11/17 [phone]

## 2017-01-11 NOTE — ED Notes (Signed)
Pt having short apneic periods when she doesn't respond to verbal stimuli followed by hyperpnea. Pulse remains unchanged.

## 2017-01-29 ENCOUNTER — Emergency Department (HOSPITAL_COMMUNITY)
Admission: EM | Admit: 2017-01-29 | Discharge: 2017-01-29 | Disposition: A | Discharge status: Home / Self Care | Payer: Medicaid Other | Attending: Emergency Medicine | Admitting: Emergency Medicine

## 2017-01-29 ENCOUNTER — Encounter (HOSPITAL_COMMUNITY): Payer: Self-pay

## 2017-01-29 DIAGNOSIS — F17210 Nicotine dependence, cigarettes, uncomplicated: Secondary | ICD-10-CM | POA: Insufficient documentation

## 2017-01-29 DIAGNOSIS — F410 Panic disorder [episodic paroxysmal anxiety] without agoraphobia: Secondary | ICD-10-CM | POA: Diagnosis not present

## 2017-01-29 DIAGNOSIS — R0602 Shortness of breath: Secondary | ICD-10-CM | POA: Diagnosis present

## 2017-01-29 DIAGNOSIS — Z79899 Other long term (current) drug therapy: Secondary | ICD-10-CM | POA: Insufficient documentation

## 2017-01-29 HISTORY — DX: Anxiety disorder, unspecified: F41.9

## 2017-01-29 MED ORDER — LORAZEPAM 1 MG PO TABS: 1.0000 mg | ORAL_TABLET | Freq: Three times a day (TID) | ORAL | 0 refills | Status: DC | PRN

## 2017-01-29 NOTE — ED Triage Notes (Signed)
Pt c/o increasing anxiety attacks since April.  Pt reports possible syncopal episode from hyperventilation last night.  Pt reports she was seen x 2 weeks ago an prescribed Ativan.  Pt recently ran out of medication.  Pt has PCP appointment the end of October.

## 2017-01-29 NOTE — Discharge Instructions (Signed)
Please read attached information. If you experience any new or worsening signs or symptoms please return to the emergency room for evaluation. Please follow-up with your primary care provider or specialist as discussed. Please use medication prescribed only as directed and discontinue taking if you have any concerning signs or symptoms.   °

## 2017-01-29 NOTE — ED Notes (Signed)
Pt ambulatory and independent at discharge.  Verbalized understanding of discharge instructions 

## 2017-01-29 NOTE — ED Notes (Signed)
U/A collected in triage. ENM

## 2017-01-29 NOTE — ED Notes (Signed)
Pt's guest came to nurse's station and asked for a time frame because she had to go to work.  This RN made patient aware of delay and apologized.

## 2017-01-29 NOTE — ED Provider Notes (Signed)
[facility] DEPT Provider Note   CSN: [id number] Arrival date & time: 01/29/17  1045     History   Chief Complaint Chief Complaint  Patient presents with  . Anxiety    HPI Katie Powell is a 26 y.o. female.  HPI   26 year old female presents today with complaints of anxiety attack.  Patient notes symptoms started in April after she was told she was pregnant (no longer pregnant).  Patient notes since then she has had intermittent episodes of extreme anxiety, shortness of breath, tingling in her fingers to the point where she nearly passes out.  Patient denies any significant chest pain associated with this, reports that she returns to her baseline with no significant difficulty.  She denies any known exacerbating activities, denies any previous cardiac history, denies any significant risk factors or history of DVT or PE.  Patient reports that she drinks coffee, does not use any drugs.  She notes that she was diagnosed with panic attacks, prescribed benzodiazepines which seemed to improve her symptoms but has recently run out.  Patient notes she has a follow-up appointment on October 19 with her primary care provider.  Patient denies any over-the-counter medication use.  Patient reports that EMS was called out, they took her vital signs which were normal at the time of her most recent panic attack.  Past Medical History:  Diagnosis Date  . Anemia   . Anxiety     Patient Active Problem List   Diagnosis Date Noted  . Missed abortion 09/22/2016  . Symptomatic anemia 08/20/2016  . Positive pregnancy test 08/20/2016  . Iron deficiency anemia due to chronic blood loss 08/20/2016  . Thrombocytosis ([place]) 08/20/2016    Past Surgical History:  Procedure Laterality Date  . EYE SURGERY    . FINGER SURGERY      OB History    Gravida Para Term Preterm AB Living   1       0     SAB TAB Ectopic Multiple Live Births     0             Home Medications    Prior to Admission  medications   Medication Sig Start Date End Date Taking? Authorizing Provider  Ca Carbonate-Mag Hydroxide (ROLAIDS) 550-110 MG CHEW Chew 1 tablet by mouth every 6 (six) hours as needed (antacid).   Yes [provider]  famotidine (PEPCID) 20 MG tablet Take 1 tablet (20 mg total) by mouth 2 (two) times daily. 08/21/16  Yes Carlisle Cater, PA-C  ferrous sulfate 325 (65 FE) MG tablet Take 1 tablet (325 mg total) by mouth 2 (two) times daily with a meal. 10/24/14  Yes Sciacca, Marissa, PA-C  guaiFENesin (ROBITUSSIN) 100 MG/5ML liquid Take 200 mg by mouth 3 (three) times daily as needed for cough or congestion.   Yes [provider]  folic acid (FOLVITE) 1 MG tablet Take 1 tablet (1 mg total) by mouth daily. Patient not taking: Reported on 10/07/2016 08/20/16   Verlee Monte, MD  ibuprofen (ADVIL,MOTRIN) 600 MG tablet Take 1 tablet (600 mg total) by mouth every 6 (six) hours as needed. Patient not taking: Reported on 10/07/2016 09/22/16   Mumaw, Lauralyn Primes, DO  LORazepam (ATIVAN) 1 MG tablet Take 1 tablet (1 mg total) by mouth every 8 (eight) hours as needed for anxiety. 01/29/17   Jassiah Viviano, Dellis Filbert, PA-C  oxyCODONE-acetaminophen (ROXICET) 5-325 MG tablet Take 1-2 tablets by mouth every 4 (four) hours as needed for severe pain. Patient not  taking: Reported on 10/07/2016 09/22/16   Mumaw, Lauralyn Primes, DO  promethazine (PHENERGAN) 12.5 MG tablet Take 1-2 tablets as needed every 6 hours for nausea and vomiting. Patient not taking: Reported on 10/07/2016 09/22/16   Mumaw, Lauralyn Primes, DO    Family History History reviewed. No pertinent family history.  Social History Social History  Substance Use Topics  . Smoking status: Current Every Day Smoker    Packs/day: 0.00    Types: Cigarettes  . Smokeless tobacco: Never Used  . Alcohol use Yes     Comment: occasionally     Allergies   Patient has no known allergies.   Review of Systems Review of Systems  All other  systems reviewed and are negative.    Physical Exam Updated Vital Signs BP 116/81 (BP Location: Right Arm)   Pulse 87   Temp 98.9 F (37.2 C) (Oral)   Resp 20   LMP 01/26/2017   SpO2 100%   Physical Exam  Constitutional: She is oriented to person, place, and time. She appears well-developed and well-nourished.  HENT:  Head: Normocephalic and atraumatic.  Eyes: Pupils are equal, round, and reactive to light. Conjunctivae are normal. Right eye exhibits no discharge. Left eye exhibits no discharge. No scleral icterus.  Neck: Normal range of motion. No JVD present. No tracheal deviation present.  Cardiovascular: Normal rate, regular rhythm, normal heart sounds and intact distal pulses.  Exam reveals no gallop and no friction rub.   No murmur heard. Pulmonary/Chest: Effort normal and breath sounds normal. No stridor. No respiratory distress. She has no wheezes. She has no rales. She exhibits no tenderness.  Musculoskeletal: She exhibits no edema.  Neurological: She is alert and oriented to person, place, and time. Coordination normal.  Psychiatric: She has a normal mood and affect. Her behavior is normal. Judgment and thought content normal.  Nursing note and vitals reviewed.    ED Treatments / Results  Labs (all labs ordered are listed, but only abnormal results are displayed) Labs Reviewed - No data to display  EKG  EKG Interpretation None       Radiology No results found.  Procedures Procedures (including critical care time)  Medications Ordered in ED Medications - No data to display   Initial Impression / Assessment and Plan / ED Course  I have reviewed the triage vital signs and the nursing notes.  Pertinent labs & imaging results that were available during my care of the patient were reviewed by me and considered in my medical decision making (see chart for details).      Final Clinical Impressions(s) / ED Diagnoses   Final diagnoses:  Anxiety attack     Labs:   Imaging:  Consults:  Therapeutics:  Discharge Meds: ativan  Assessment/Plan: 26 year old female presents today with anxiety attack.  She is well-appearing in no acute distress at time of my evaluation.  As I discussed her anxiety she becomes very tearful and anxious.  I have very low suspicion for organic cause of her anxiety or panic attacks.  Patient had workup last time she was in the emergency room including EKG, troponin, chest x-ray without significant findings.  Patient does have a follow-up appointment with her primary care provider.  She will be even a small prescription of Ativan as needed for acute panic attacks.  She will follow-up as an outpatient.  I discussed meditation, reduction in caffeine, and stress reduction exercises with the patient.  She is given strict return precautions, she verbalized understanding  and agreement to today's plan had no further questions or concerns the time discharge      New Prescriptions Current Discharge Medication List       Okey Regal, PA-C 01/29/17 [place], [place], MD 01/30/17 [phone]

## 2017-05-18 ENCOUNTER — Encounter (HOSPITAL_COMMUNITY): Payer: Self-pay | Admitting: Emergency Medicine

## 2017-05-18 ENCOUNTER — Other Ambulatory Visit: Payer: Self-pay

## 2017-05-18 ENCOUNTER — Emergency Department (HOSPITAL_COMMUNITY)
Admission: EM | Admit: 2017-05-18 | Discharge: 2017-05-18 | Disposition: A | Discharge status: Home / Self Care | Payer: Medicaid Other | Attending: Emergency Medicine | Admitting: Emergency Medicine

## 2017-05-18 DIAGNOSIS — Z79899 Other long term (current) drug therapy: Secondary | ICD-10-CM | POA: Diagnosis not present

## 2017-05-18 DIAGNOSIS — N920 Excessive and frequent menstruation with regular cycle: Secondary | ICD-10-CM | POA: Insufficient documentation

## 2017-05-18 DIAGNOSIS — F17210 Nicotine dependence, cigarettes, uncomplicated: Secondary | ICD-10-CM | POA: Insufficient documentation

## 2017-05-18 DIAGNOSIS — N939 Abnormal uterine and vaginal bleeding, unspecified: Secondary | ICD-10-CM | POA: Diagnosis present

## 2017-05-18 LAB — CBC
HCT: 31.9 % — ABNORMAL LOW (ref 36.0–46.0)
Hemoglobin: 10 g/dL — ABNORMAL LOW (ref 12.0–15.0)
MCH: 26 pg (ref 26.0–34.0)
MCHC: 31.3 g/dL (ref 30.0–36.0)
MCV: 83.1 fL (ref 78.0–100.0)
PLATELETS: 230 10*3/uL (ref 150–400)
RBC: 3.84 MIL/uL — AB (ref 3.87–5.11)
RDW: 12.5 % (ref 11.5–15.5)
WBC: 5.3 10*3/uL (ref 4.0–10.5)

## 2017-05-18 LAB — I-STAT BETA HCG BLOOD, ED (MC, WL, AP ONLY)

## 2017-05-18 LAB — BASIC METABOLIC PANEL
ANION GAP: 5 (ref 5–15)
BUN: 13 mg/dL (ref 6–20)
CALCIUM: 8.7 mg/dL — AB (ref 8.9–10.3)
CO2: 25 mmol/L (ref 22–32)
Chloride: 106 mmol/L (ref 101–111)
Creatinine, Ser: 0.58 mg/dL (ref 0.44–1.00)
GFR calc Af Amer: >60 mL/min (ref >60)
GLUCOSE: 75 mg/dL (ref 65–99)
POTASSIUM: 3.4 mmol/L — AB (ref 3.5–5.1)
SODIUM: 136 mmol/L (ref 135–145)

## 2017-05-18 NOTE — ED Provider Notes (Signed)
[facility]ERGENC[facility] DEPARTMENT Provider Note   CSN: [id number] Arrival date & time: 05/18/17  1553     History   Chief Complaint Chief Complaint  Patient presents with  . Vaginal Bleeding    HPI Katie Powell is a 27 y.o. female.  Katie Powell is a 27 y.o. Female resents to the emergency department complaining of heavy vaginal bleeding for the past 2 days.  Patient reports that since November her menstrual cycles have been heavier than usual.  She reports each month during her menstrual cycle she has heavy bleeding.  She reports her menstrual cycle began 2 days ago on time.  She reports since then she is been having heavy bleeding and has used 6 pads today.  Checked out, because she had missed abortion previously where she required blood transfusion.  She denies any symptoms of anemia.  She denies feeling lightheaded, dizzy, shortness of breath or having chest pain.  No syncope.  She denies any urinary symptoms or vaginal discharge.  She denies abdominal pain, fevers, lightheadedness, dizziness, syncope, shortness of breath, or rashes.   The history is provided by the patient and medical records. No language interpreter was used.  Vaginal Bleeding  Primary symptoms include vaginal bleeding.  Primary symptoms include no pelvic pain, no dysuria. Pertinent negatives include no abdominal pain, no diarrhea, no nausea, no vomiting, no frequency, no light-headedness and no dizziness.    Past Medical History:  Diagnosis Date  . Anemia   . Anxiety     Patient Active Problem List   Diagnosis Date Noted  . Missed abortion 09/22/2016  . Symptomatic anemia 08/20/2016  . Positive pregnancy test 08/20/2016  . Iron deficiency anemia due to chronic blood loss 08/20/2016  . Thrombocytosis ([place]) 08/20/2016    Past Surgical History:  Procedure Laterality Date  . EYE SURGERY    . FINGER SURGERY      OB History    Gravida Para Term Preterm AB Living   1       0       SAB TAB Ectopic Multiple Live Births     0             Home Medications    Prior to Admission medications   Medication Sig Start Date End Date Taking? Authorizing Provider  Ca Carbonate-Mag Hydroxide (ROLAIDS) 550-110 MG CHEW Chew 1 tablet by mouth every 6 (six) hours as needed (antacid).    [provider]  famotidine (PEPCID) 20 MG tablet Take 1 tablet (20 mg total) by mouth 2 (two) times daily. 08/21/16   Carlisle Cater, PA-C  ferrous sulfate 325 (65 FE) MG tablet Take 1 tablet (325 mg total) by mouth 2 (two) times daily with a meal. 10/24/14   Sciacca, Marissa, PA-C  folic acid (FOLVITE) 1 MG tablet Take 1 tablet (1 mg total) by mouth daily. Patient not taking: Reported on 10/07/2016 08/20/16   Verlee Monte, MD  guaiFENesin (ROBITUSSIN) 100 MG/5ML liquid Take 200 mg by mouth 3 (three) times daily as needed for cough or congestion.    [provider]  ibuprofen (ADVIL,MOTRIN) 600 MG tablet Take 1 tablet (600 mg total) by mouth every 6 (six) hours as needed. Patient not taking: Reported on 10/07/2016 09/22/16   Mumaw, Lauralyn Primes, DO  LORazepam (ATIVAN) 1 MG tablet Take 1 tablet (1 mg total) by mouth every 8 (eight) hours as needed for anxiety. 01/29/17   Hedges, Dellis Filbert, PA-C  oxyCODONE-acetaminophen (ROXICET) 5-325 MG  tablet Take 1-2 tablets by mouth every 4 (four) hours as needed for severe pain. Patient not taking: Reported on 10/07/2016 09/22/16   Mumaw, Lauralyn Primes, DO  promethazine (PHENERGAN) 12.5 MG tablet Take 1-2 tablets as needed every 6 hours for nausea and vomiting. Patient not taking: Reported on 10/07/2016 09/22/16   Mumaw, Lauralyn Primes, DO    Family History No family history on file.  Social History Social History   Tobacco Use  . Smoking status: Current Every Day Smoker    Packs/day: 0.00    Types: Cigarettes  . Smokeless tobacco: Never Used  Substance Use Topics  . Alcohol use: Yes    Comment: occasionally  . Drug use: No      Allergies   Patient has no known allergies.   Review of Systems Review of Systems  Constitutional: Negative for chills and fever.  HENT: Negative for congestion and sore throat.   Eyes: Negative for visual disturbance.  Respiratory: Negative for cough and shortness of breath.   Cardiovascular: Negative for chest pain.  Gastrointestinal: Negative for abdominal pain, diarrhea, nausea and vomiting.  Genitourinary: Positive for vaginal bleeding. Negative for decreased urine volume, difficulty urinating, dysuria, frequency, genital sores, pelvic pain, vaginal discharge and vaginal pain.  Musculoskeletal: Negative for back pain.  Skin: Negative for rash.  Neurological: Negative for dizziness, syncope, light-headedness and headaches.     Physical Exam Updated Vital Signs BP 116/86 (BP Location: Right Arm)   Pulse 72   Temp 98.5 F (36.9 C) (Oral)   Resp 17   Ht 5\' 6"  (1.676 m)   Wt 74.8 kg (165 lb)   LMP 05/18/2017 (Approximate)   SpO2 100%   BMI 26.63 kg/m   Physical Exam  Constitutional: She appears well-developed and well-nourished. No distress.  HENT:  Head: Normocephalic and atraumatic.  Mouth/Throat: Oropharynx is clear and moist.  Eyes: Conjunctivae are normal. Pupils are equal, round, and reactive to light. Right eye exhibits no discharge. Left eye exhibits no discharge.  Neck: Neck supple.  Cardiovascular: Normal rate, regular rhythm, normal heart sounds and intact distal pulses.  Pulmonary/Chest: Effort normal and breath sounds normal. No respiratory distress. She has no wheezes. She has no rales.  Abdominal: Soft. She exhibits no distension. There is no tenderness. There is no guarding.  Abdomen is soft and nontender to palpation.  Genitourinary:  Genitourinary Comments: Pelvic exam with female RN as chaperone.  Small amount of blood in her vaginal vault.  This is easily cleared with large Q-tip.  Cervix is closed.  No cervical motion tenderness.  No adnexal  tenderness or fullness.  No external lesions or rashes noted.  Musculoskeletal: She exhibits no edema.  Lymphadenopathy:    She has no cervical adenopathy.  Neurological: She is alert. Coordination normal.  Skin: Skin is warm and dry. No rash noted. She is not diaphoretic. No erythema. No pallor.  Psychiatric: She has a normal mood and affect. Her behavior is normal.  Nursing note and vitals reviewed.    ED Treatments / Results  Labs (all labs ordered are listed, but only abnormal results are displayed) Labs Reviewed  CBC - Abnormal; Notable for the following components:      Result Value   RBC 3.84 (*)    Hemoglobin 10.0 (*)    HCT 31.9 (*)    All other components within normal limits  BASIC METABOLIC PANEL - Abnormal; Notable for the following components:   Potassium 3.4 (*)    Calcium 8.7 (*)  All other components within normal limits  I-STAT BETA HCG BLOOD, ED ([facility], [facility], [facility] ONLY)    EKG  EKG Interpretation None       Radiology No results found.  Procedures Procedures (including critical care time)  Medications Ordered in ED Medications - No data to display   Initial Impression / Assessment and Plan / ED Course  I have reviewed the triage vital signs and the nursing notes.  Pertinent labs & imaging results that were available during my care of the patient were reviewed by me and considered in my medical decision making (see chart for details).    This is a 27 y.o. Female resents to the emergency department complaining of heavy vaginal bleeding for the past 2 days.  Patient reports that since November her menstrual cycles have been heavier than usual.  She reports each month during her menstrual cycle she has heavy bleeding.  She reports her menstrual cycle began 2 days ago on time.  She reports since then she is been having heavy bleeding and has used 6 pads today.  Checked out, because she had missed abortion previously where she required blood transfusion.  She  denies any symptoms of anemia.  She denies feeling lightheaded, dizzy, shortness of breath or having chest pain.  No syncope. On exam the patient is afebrile nontoxic-appearing.  Her abdomen is soft and nontender to palpation.  On pelvic exam she is a small amount of blood in her vaginal vault.  This is easily cleared with a Q-tip.  Cervix is closed.  No cervical motion tenderness.  No adnexal tenderness or fullness. Pregnancy test is negative.  Hemoglobin is 10.0.  BMP is unremarkable. Patient denies any symptoms of anemia.  She feels reassured after I told her her hemoglobin was 10.0.  I encouraged her to follow-up with OB/GYN to discuss methods to help with her menorrhagia.  I encouraged her to continue taking her iron supplementation.  I discussed signs and symptoms of anemia and that the symptoms would warrant her to return to the emergency department immediately. I advised the patient to follow-up with their primary care provider this week. I advised the patient to return to the emergency department with new or worsening symptoms or new concerns. The patient verbalized understanding and agreement with plan.      Final Clinical Impressions(s) / ED Diagnoses   Final diagnoses:  Menorrhagia with regular cycle    ED Discharge Orders    None       Sharmaine Base 05/18/17 2059    Carmin Muskrat, MD 05/19/17 [phone]

## 2017-05-18 NOTE — ED Triage Notes (Signed)
Pt to ED c/o heavy vaginal bleeding.  Pt denies any cramping.  Pt also st's she's been passing clots

## 2017-05-18 NOTE — ED Provider Notes (Signed)
Patient placed in Quick Look pathway, seen and evaluated for chief complaint of vaginal bleeding x 2 days ago.  Pertinent H&P findings include no abdominal pain. No fatigue. Hx same that required transfusion.  Based on initial evaluation, labs are indicated and radiology studies are not indicated.  Patient counseled on process, plan, and necessity for staying for completing the evaluation.    Shary Decamp, PA-C 05/18/17 1637    Pattricia Boss, MD 05/19/17 [phone]

## 2017-07-23 ENCOUNTER — Emergency Department (HOSPITAL_COMMUNITY)
Admission: EM | Admit: 2017-07-23 | Discharge: 2017-07-23 | Disposition: A | Discharge status: Home / Self Care | Payer: Managed Care, Other (non HMO) | Attending: Emergency Medicine | Admitting: Emergency Medicine

## 2017-07-23 ENCOUNTER — Encounter (HOSPITAL_COMMUNITY): Payer: Self-pay | Admitting: Emergency Medicine

## 2017-07-23 ENCOUNTER — Other Ambulatory Visit: Payer: Self-pay

## 2017-07-23 DIAGNOSIS — Z79899 Other long term (current) drug therapy: Secondary | ICD-10-CM | POA: Diagnosis not present

## 2017-07-23 DIAGNOSIS — F17210 Nicotine dependence, cigarettes, uncomplicated: Secondary | ICD-10-CM | POA: Diagnosis not present

## 2017-07-23 DIAGNOSIS — T782XXA Anaphylactic shock, unspecified, initial encounter: Secondary | ICD-10-CM | POA: Insufficient documentation

## 2017-07-23 DIAGNOSIS — R0989 Other specified symptoms and signs involving the circulatory and respiratory systems: Secondary | ICD-10-CM | POA: Diagnosis present

## 2017-07-23 MED ORDER — FAMOTIDINE 20 MG PO TABS: 20.0000 mg | ORAL_TABLET | Freq: Two times a day (BID) | ORAL | 0 refills | Status: DC

## 2017-07-23 MED ORDER — DIPHENHYDRAMINE HCL 50 MG/ML IJ SOLN
25.0000 mg | Freq: Once | INTRAMUSCULAR | Status: AC
  Administered 2017-07-23: 25 mg via INTRAVENOUS
  Filled 2017-07-23: qty 1

## 2017-07-23 MED ORDER — FAMOTIDINE IN NACL 20-0.9 MG/50ML-% IV SOLN
20.0000 mg | Freq: Once | INTRAVENOUS | Status: AC
  Administered 2017-07-23: 20 mg via INTRAVENOUS
  Filled 2017-07-23: qty 50

## 2017-07-23 MED ORDER — EPINEPHRINE 0.3 MG/0.3ML IJ SOAJ: 0.3000 mg | Freq: Once | INTRAMUSCULAR | 1 refills | Status: AC

## 2017-07-23 MED ORDER — METHYLPREDNISOLONE SODIUM SUCC 125 MG IJ SOLR
125.0000 mg | Freq: Once | INTRAMUSCULAR | Status: AC
  Administered 2017-07-23: 125 mg via INTRAVENOUS
  Filled 2017-07-23: qty 2

## 2017-07-23 MED ORDER — PREDNISONE 10 MG (21) PO TBPK: ORAL_TABLET | Freq: Every day | ORAL | 0 refills | Status: DC

## 2017-07-23 NOTE — ED Triage Notes (Addendum)
Pt arriving from home via EMS with allergic reaction of unknown source. Pt took 25mg  of Benadryl. Pt reports she has been breaking out in hives over the last week and has been taking Benadryl but today symptoms became worse. Pt states she felt as if her throat was closing. Pt received 0.3 Epi 1 to1 prior to arrival in the left bicep and 25mg  Benadryl.   Pt speaking clearly at this time. No wheezing.   122/87 HR 70 Resp 18 99% room air

## 2017-07-23 NOTE — ED Provider Notes (Signed)
Pt is alert. No symptoms. Home with outpatient plan for anaphylaxis/allergic reaction per Dr Marrianne Mood, MD 07/23/17 [phone]

## 2017-07-23 NOTE — Discharge Instructions (Addendum)
You may continue Benadryl 50 mg every 6 hours as needed for itching, hives.  Please follow-up closely with an allergy specialist.

## 2017-07-23 NOTE — ED Notes (Signed)
Bed: WK46 Expected date:  Expected time:  Means of arrival:  Comments: EMS allergic reaction

## 2017-07-23 NOTE — ED Provider Notes (Signed)
TIME SEEN: 5:14 AM  CHIEF COMPLAINT: Allergic reaction  HPI: Patient is a 27 year old female with history of anxiety who presents to the emergency department with an allergic reaction.  Patient comes in with EMS.  States for the past week she has had intermittent hives.  Denies any new soaps, lotions, detergents, medications, foods or pet exposures.  States she has been taking Benadryl with relief at home but then tonight started feeling like she could not breathe and that her throat was closing.  This started around 4 AM and woke her from sleep.  Took 5 mg of oral Benadryl at home.  With EMS patient was given another 25 mg of IV Benadryl and 0.3 mg of IM epinephrine.  Patient is now asymptomatic.  She reports that she has had intermittent lip swelling but none today.  No wheezing.  ROS: See HPI Constitutional: no fever  Eyes: no drainage  ENT: no runny nose   Cardiovascular:  no chest pain  Resp:  SOB  GI: no vomiting GU: no dysuria Integumentary: no rash  Allergy:  hives  Musculoskeletal: no leg swelling  Neurological: no slurred speech ROS otherwise negative  PAST MEDICAL HISTORY/PAST SURGICAL HISTORY:  Past Medical History:  Diagnosis Date  . Anemia   . Anxiety     MEDICATIONS:  Prior to Admission medications   Medication Sig Start Date End Date Taking? Authorizing Provider  Ca Carbonate-Mag Hydroxide (ROLAIDS) 550-110 MG CHEW Chew 1 tablet by mouth every 6 (six) hours as needed (antacid).    [provider]  famotidine (PEPCID) 20 MG tablet Take 1 tablet (20 mg total) by mouth 2 (two) times daily. 08/21/16   Carlisle Cater, PA-C  ferrous sulfate 325 (65 FE) MG tablet Take 1 tablet (325 mg total) by mouth 2 (two) times daily with a meal. 10/24/14   Sciacca, Marissa, PA-C  folic acid (FOLVITE) 1 MG tablet Take 1 tablet (1 mg total) by mouth daily. Patient not taking: Reported on 10/07/2016 08/20/16   Verlee Monte, MD  guaiFENesin (ROBITUSSIN) 100 MG/5ML liquid Take 200 mg  by mouth 3 (three) times daily as needed for cough or congestion.    [provider]  ibuprofen (ADVIL,MOTRIN) 600 MG tablet Take 1 tablet (600 mg total) by mouth every 6 (six) hours as needed. Patient not taking: Reported on 10/07/2016 09/22/16   Mumaw, Lauralyn Primes, DO  LORazepam (ATIVAN) 1 MG tablet Take 1 tablet (1 mg total) by mouth every 8 (eight) hours as needed for anxiety. 01/29/17   Hedges, Dellis Filbert, PA-C  oxyCODONE-acetaminophen (ROXICET) 5-325 MG tablet Take 1-2 tablets by mouth every 4 (four) hours as needed for severe pain. Patient not taking: Reported on 10/07/2016 09/22/16   Mumaw, Lauralyn Primes, DO  promethazine (PHENERGAN) 12.5 MG tablet Take 1-2 tablets as needed every 6 hours for nausea and vomiting. Patient not taking: Reported on 10/07/2016 09/22/16   Mumaw, Lauralyn Primes, DO    ALLERGIES:  No Known Allergies  SOCIAL HISTORY:  Social History   Tobacco Use  . Smoking status: Current Every Day Smoker    Packs/day: 0.00    Types: Cigarettes  . Smokeless tobacco: Never Used  Substance Use Topics  . Alcohol use: Yes    Comment: occasionally    FAMILY HISTORY: No family history on file.  EXAM: BP 105/68 (BP Location: Right Arm)   Pulse 63   Temp 98.1 F (36.7 C) (Oral)   Resp 15   SpO2 100%  CONSTITUTIONAL: Alert and oriented and  responds appropriately to questions. Well-appearing; well-nourished HEAD: Normocephalic EYES: Conjunctivae clear, pupils appear equal, EOMI ENT: normal nose; moist mucous membranes, no angioedema, no stridor, normal phonation, no trismus or drooling NECK: Supple, no meningismus, no nuchal rigidity, no LAD  CARD: RRR; S1 and S2 appreciated; no murmurs, no clicks, no rubs, no gallops RESP: Normal chest excursion without splinting or tachypnea; breath sounds clear and equal bilaterally; no wheezes, no rhonchi, no rales, no hypoxia or respiratory distress, speaking full sentences ABD/GI: Normal bowel sounds;  non-distended; soft, non-tender, no rebound, no guarding, no peritoneal signs, no hepatosplenomegaly BACK:  The back appears normal and is non-tender to palpation, there is no CVA tenderness EXT: Normal ROM in all joints; non-tender to palpation; no edema; normal capillary refill; no cyanosis, no calf tenderness or swelling    SKIN: Normal color for age and race; warm; scattered urticaria NEURO: Moves all extremities equally PSYCH: The patient's mood and manner are appropriate. Grooming and personal hygiene are appropriate.  MEDICAL DECISION MAKING: Patient here with allergic reaction.  Reports sensation of throat swelling and difficulty breathing have resolved.  Her urticaria have almost resolved.  Will give another 25 mg of IV Benadryl, IV Solu-Medrol and IV Pepcid.  She will need to be monitored for 4 hours after receiving IM epinephrine with EMS.  ED PROGRESS: Patient still asymptomatic and resting comfortably.  Signed out to Dr. Venora Maples to reassess patient at 9 AM.   I reviewed all nursing notes, vitals, pertinent previous records, EKGs, lab and urine results, imaging (as available).     CRITICAL CARE Performed by: Pryor Curia   Total critical care time: 40 minutes  Critical care time was exclusive of separately billable procedures and treating other patients.  Critical care was necessary to treat or prevent imminent or life-threatening deterioration.  Critical care was time spent personally by me on the following activities: development of treatment plan with patient and/or surrogate as well as nursing, discussions with consultants, evaluation of patient's response to treatment, examination of patient, obtaining history from patient or surrogate, ordering and performing treatments and interventions, ordering and review of laboratory studies, ordering and review of radiographic studies, pulse oximetry and re-evaluation of patient's condition.    Ward, Delice Bison, DO 07/23/17  0730

## 2017-11-03 ENCOUNTER — Encounter (HOSPITAL_COMMUNITY): Payer: Self-pay

## 2017-11-03 ENCOUNTER — Other Ambulatory Visit: Payer: Self-pay

## 2017-11-03 ENCOUNTER — Emergency Department (HOSPITAL_COMMUNITY)
Admission: EM | Admit: 2017-11-03 | Discharge: 2017-11-03 | Disposition: A | Discharge status: Home / Self Care | Payer: Managed Care, Other (non HMO) | Attending: Emergency Medicine | Admitting: Emergency Medicine

## 2017-11-03 DIAGNOSIS — Z79899 Other long term (current) drug therapy: Secondary | ICD-10-CM | POA: Diagnosis not present

## 2017-11-03 DIAGNOSIS — J029 Acute pharyngitis, unspecified: Secondary | ICD-10-CM | POA: Insufficient documentation

## 2017-11-03 DIAGNOSIS — F17210 Nicotine dependence, cigarettes, uncomplicated: Secondary | ICD-10-CM | POA: Insufficient documentation

## 2017-11-03 DIAGNOSIS — K0889 Other specified disorders of teeth and supporting structures: Secondary | ICD-10-CM | POA: Insufficient documentation

## 2017-11-03 DIAGNOSIS — D649 Anemia, unspecified: Secondary | ICD-10-CM | POA: Diagnosis not present

## 2017-11-03 LAB — BASIC METABOLIC PANEL
Anion gap: 6 (ref 5–15)
BUN: 14 mg/dL (ref 6–20)
CO2: 24 mmol/L (ref 22–32)
Calcium: 8.7 mg/dL — ABNORMAL LOW (ref 8.9–10.3)
Chloride: 107 mmol/L (ref 98–111)
Creatinine, Ser: 0.65 mg/dL (ref 0.44–1.00)
GFR calc Af Amer: >60 mL/min (ref >60)
GLUCOSE: 89 mg/dL (ref 70–99)
POTASSIUM: 3.3 mmol/L — AB (ref 3.5–5.1)
Sodium: 137 mmol/L (ref 135–145)

## 2017-11-03 LAB — I-STAT BETA HCG BLOOD, ED (MC, WL, AP ONLY): I-stat hCG, quantitative: <5 m[IU]/mL (ref <5)

## 2017-11-03 LAB — CBC
HCT: 29.1 % — ABNORMAL LOW (ref 36.0–46.0)
Hemoglobin: 8.8 g/dL — ABNORMAL LOW (ref 12.0–15.0)
MCH: 20.7 pg — AB (ref 26.0–34.0)
MCHC: 30.2 g/dL (ref 30.0–36.0)
MCV: 68.5 fL — AB (ref 78.0–100.0)
PLATELETS: 256 10*3/uL (ref 150–400)
RBC: 4.25 MIL/uL (ref 3.87–5.11)
RDW: 17.1 % — ABNORMAL HIGH (ref 11.5–15.5)
WBC: 8.3 10*3/uL (ref 4.0–10.5)

## 2017-11-03 LAB — GROUP A STREP BY PCR: Group A Strep by PCR: NOT DETECTED

## 2017-11-03 MED ORDER — PENICILLIN V POTASSIUM 500 MG PO TABS: 500.0000 mg | ORAL_TABLET | Freq: Four times a day (QID) | ORAL | 0 refills | Status: AC

## 2017-11-03 MED ORDER — ACETAMINOPHEN 325 MG PO TABS
650.0000 mg | ORAL_TABLET | Freq: Once | ORAL | Status: AC | PRN
  Administered 2017-11-03: 650 mg via ORAL
  Filled 2017-11-03: qty 2

## 2017-11-03 MED ORDER — IBUPROFEN 800 MG PO TABS
800.0000 mg | ORAL_TABLET | Freq: Once | ORAL | Status: AC
  Administered 2017-11-03: 800 mg via ORAL
  Filled 2017-11-03: qty 1

## 2017-11-03 NOTE — Discharge Instructions (Addendum)
You were given a prescription for antibiotics. Please take the antibiotic prescription fully.   I have prescribed a new medication for you today. It is important that when you pick the prescription up you discuss the potential interactions of this medication with other medications you are taking, including over the counter medications, with the pharmacists.   This new medication has potential side effects. Be sure to contact your primary care provider or return to the emergency department if you are experiencing new symptoms that you are unable to tolerate after starting the medication. You need to receive medical evaluation immediately if you start to experience blistering of the skin, rash, swelling, or difficulty breathing as these signs could indicate a more serious medication side effect.   Please follow up with your primary care provider within 5-7 days for re-evaluation of your symptoms. If you do not have a primary care provider, information for a healthcare clinic has been provided for you to make arrangements for follow up care. You were given information for a dentist to follow up with. Please make an appointment with a dentist.   If you were given a prescription, please take the prescription as you were instructed and follow the directions given on the discharge paperwork.   Please return to the emergency department for any persistent fevers, worsening sore throat/hoarse voice, inability to swallow, persistent vomiting, chest pain, shortness of breath, coughing up blood, or any new or worsening symptoms.

## 2017-11-03 NOTE — ED Triage Notes (Signed)
Patient c/o right upper dental pain x 2-3 days. Patient also c/o sore throat since yesterday. Patient febrile in triage-102.4

## 2017-11-03 NOTE — ED Provider Notes (Signed)
[facility] DEPT Provider Note   CSN: [id number] Arrival date & time: 11/03/17  1248     History   Chief Complaint Chief Complaint  Patient presents with  . Dental Pain  . Sore Throat    HPI Katie Powell is a 27 y.o. female.  HPI   Patient is a 27 year old female with a history of anxiety, anemia who presents emergency department today to be evaluated for dental pain and a sore throat.  Patient states that she has left lower dental pain which began about 3 days ago.  Pain is constant and worse when she is eating.  She has not tried any interventions or medications.  She does not have a dentist.  She also states that she developed a sore throat yesterday.  She also began to have fevers and chills this morning.  She reports some lymphadenopathy, but denies any facial swelling or neck swelling.  No difficulty swallowing however it is painful.  She has been able to tolerate liquids and solids at home.  She denies any other symptoms including no rhinorrhea, congestion, ear pain, cough, change in voice, chest pain, shortness of breath, abdominal pain, nausea, vomiting or urinary symptoms.  She states that she was recently around her friend at work who had a sore throat and fever several days ago.  Past Medical History:  Diagnosis Date  . Anemia   . Anxiety     Patient Active Problem List   Diagnosis Date Noted  . Missed abortion 09/22/2016  . Symptomatic anemia 08/20/2016  . Positive pregnancy test 08/20/2016  . Iron deficiency anemia due to chronic blood loss 08/20/2016  . Thrombocytosis ([place]) 08/20/2016    Past Surgical History:  Procedure Laterality Date  . EYE SURGERY    . FINGER SURGERY       OB History    Gravida  1   Para      Term      Preterm      AB  0   Living        SAB      TAB  0   Ectopic      Multiple      Live Births               Home Medications    Prior to Admission medications     Medication Sig Start Date End Date Taking? Authorizing Provider  famotidine (PEPCID) 20 MG tablet Take 1 tablet (20 mg total) by mouth 2 (two) times daily. 07/23/17   Ward, Delice Bison, DO  ferrous sulfate 325 (65 FE) MG tablet Take 1 tablet (325 mg total) by mouth 2 (two) times daily with a meal. Patient not taking: Reported on 07/23/2017 10/24/14   Sciacca, Mable Fill, PA-C  folic acid (FOLVITE) 1 MG tablet Take 1 tablet (1 mg total) by mouth daily. Patient not taking: Reported on 10/07/2016 08/20/16   Verlee Monte, MD  ibuprofen (ADVIL,MOTRIN) 600 MG tablet Take 1 tablet (600 mg total) by mouth every 6 (six) hours as needed. Patient not taking: Reported on 10/07/2016 09/22/16   Mumaw, Lauralyn Primes, DO  LORazepam (ATIVAN) 1 MG tablet Take 1 tablet (1 mg total) by mouth every 8 (eight) hours as needed for anxiety. Patient not taking: Reported on 07/23/2017 01/29/17   Hedges, Dellis Filbert, PA-C  oxyCODONE-acetaminophen (ROXICET) 5-325 MG tablet Take 1-2 tablets by mouth every 4 (four) hours as needed for severe pain. Patient not taking: Reported on 10/07/2016 09/22/16  Mumaw, Lauralyn Primes, DO  penicillin v potassium (VEETID) 500 MG tablet Take 1 tablet (500 mg total) by mouth 4 (four) times daily for 10 days. 11/03/17 11/13/17  Zaniya Mcaulay S, PA-C  predniSONE (STERAPRED UNI-PAK 21 TAB) 10 MG (21) TBPK tablet Take by mouth daily. Take 6 tabs by mouth daily  for 2 days, then 5 tabs for 2 days, then 4 tabs for 2 days, then 3 tabs for 2 days, 2 tabs for 2 days, then 1 tab by mouth daily for 2 days 07/23/17   Ward, Delice Bison, DO  promethazine (PHENERGAN) 12.5 MG tablet Take 1-2 tablets as needed every 6 hours for nausea and vomiting. Patient not taking: Reported on 10/07/2016 09/22/16   Mumaw, Lauralyn Primes, DO    Family History History reviewed. No pertinent family history.  Social History Social History   Tobacco Use  . Smoking status: Current Every Day Smoker    Packs/day: 0.00    Types:  Cigarettes  . Smokeless tobacco: Never Used  Substance Use Topics  . Alcohol use: Yes    Comment: occasionally  . Drug use: No     Allergies   Patient has no known allergies.   Review of Systems Review of Systems  Constitutional: Negative for fever.  HENT: Positive for dental problem and sore throat. Negative for congestion, ear pain, facial swelling, rhinorrhea, sinus pressure, sinus pain, trouble swallowing and voice change.   Eyes: Negative for visual disturbance.  Respiratory: Negative for shortness of breath.   Cardiovascular: Negative for chest pain.  Gastrointestinal: Negative for abdominal pain, nausea and vomiting.  Genitourinary: Negative for pelvic pain.  Musculoskeletal: Negative for back pain.  Skin: Negative for wound.  Neurological: Negative for headaches.     Physical Exam Updated Vital Signs BP 116/85 (BP Location: Left Arm)   Pulse 93   Temp 99.9 F (37.7 C) (Oral)   Resp 16   Ht 5\' 6"  (1.676 m)   Wt 78.5 kg (173 lb)   LMP 10/30/2017   SpO2 100%   BMI 27.92 kg/m   Physical Exam  Constitutional: She appears well-developed and well-nourished.  Non-toxic appearance. She does not appear ill. No distress.  HENT:  Head: Normocephalic and atraumatic.  Right Ear: Hearing, tympanic membrane and ear canal normal.  Left Ear: Hearing, tympanic membrane and ear canal normal.  Mouth/Throat: Uvula is midline and mucous membranes are normal. Oropharyngeal exudate and posterior oropharyngeal erythema present. No tonsillar abscesses. Tonsils are 2+ on the right. Tonsils are 2+ on the left. Tonsillar exudate.  Dental caries diffusely, tooth #32 has small fluid collection adjacent to tooth that is actively draining purulent material. Uvula midline. Patent airway with normal voice. No swelling beneath the tongue. No neck swelling.  Eyes: Pupils are equal, round, and reactive to light. Conjunctivae and EOM are normal.  Neck: Normal range of motion. Neck supple.    Cardiovascular: Normal rate, regular rhythm, normal heart sounds and intact distal pulses.  No murmur heard. Pulmonary/Chest: Effort normal and breath sounds normal. No respiratory distress.  Abdominal: Soft. There is no tenderness.  Musculoskeletal: She exhibits no edema.  Lymphadenopathy:    She has cervical adenopathy.  Neurological: She is alert.  Skin: Skin is warm and dry. Capillary refill takes less than 2 seconds.  Psychiatric: She has a normal mood and affect.  Nursing note and vitals reviewed.  ED Treatments / Results  Labs (all labs ordered are listed, but only abnormal results are displayed) Labs Reviewed  CBC -  Abnormal; Notable for the following components:      Result Value   Hemoglobin 8.8 (*)    HCT 29.1 (*)    MCV 68.5 (*)    MCH 20.7 (*)    RDW 17.1 (*)    All other components within normal limits  BASIC METABOLIC PANEL - Abnormal; Notable for the following components:   Potassium 3.3 (*)    Calcium 8.7 (*)    All other components within normal limits  GROUP A STREP BY PCR  I-STAT BETA HCG BLOOD, ED ([facility], [facility], [facility] ONLY)    EKG None  Radiology No results found.  Procedures Procedures (including critical care time)  Medications Ordered in ED Medications  acetaminophen (TYLENOL) tablet 650 mg (650 mg Oral Given 11/03/17 1328)  ibuprofen (ADVIL,MOTRIN) tablet 800 mg (800 mg Oral Given 11/03/17 1603)     Initial Impression / Assessment and Plan / ED Course  I have reviewed the triage vital signs and the nursing notes.  Pertinent labs & imaging results that were available during my care of the patient were reviewed by me and considered in my medical decision making (see chart for details).     Final Clinical Impressions(s) / ED Diagnoses   Final diagnoses:  Pain, dental  Pharyngitis, unspecified etiology  Anemia, unspecified type   Patient presented to the ED complaining of dental pain and a sore throat.  Dental pain began prior to her sore  throat.  She notes that she has had recent sick contacts with a sore throat and fever.  She was febrile to 102.4 on arrival to the ED.  Tachycardic to 106 but otherwise her vital signs are stable.  On exam she has some pharyngeal erythema with tonsillar exudates and tonsillar edema bilaterally.  You know unilateral or tonsillar swelling and no evidence of a retropharyngeal abscess or deep space infection.  Clinically patient appears to have strep throat.  No evidence of Ludwig's angina on exam.  She does have evidence that she may have had a small dental abscess to the left lower part of the mouth which is actively draining a purulent material at this time.  Her strep test was negative however may be a false negative given her symptoms just recently started.  Will treat her with penicillin to cover dental infection and suspected strep throat.  Labs were drawn which did not show a leukocytosis.  Patient is anemic and has a history of this.  Advised her to increase her regular dose of iron and follow-up with her PCP.  Also mildly hypokalemic and advised her to follow-up with her PCP about this.  Patient is nontoxic and nonseptic appearing.  She was able to tolerate p.o. in the ED without difficulty with intact airway.  Her vital signs normalized after administration of Tylenol in the department.  She is safe for discharge home with outpatient follow-up.  Return precautions discussed.  All questions answered patient understands plan reasons to return immediately to the ED.  ED Discharge Orders        Ordered    penicillin v potassium (VEETID) 500 MG tablet  4 times daily     11/03/17 [address] Azora Bonzo S, PA-C 11/03/17 1655    Charlesetta Shanks, MD 11/11/17 2340

## 2017-12-24 IMAGING — US US OB TRANSVAGINAL
1 series · 15 of 28 positions shown · non-contrast
Comparison: 08/25/2016

CLINICAL DATA: First trimester pregnancy with inconclusive fetal
viability.

EXAM:
TRANSVAGINAL OB ULTRASOUND
TECHNIQUE: Transvaginal ultrasound was performed for complete evaluation of the
gestation as well as the maternal uterus, adnexal regions, and
pelvic cul-de-sac.

[Series 1: us ob transvaginal · 15 of 42 slices shown]
[im 1/42]
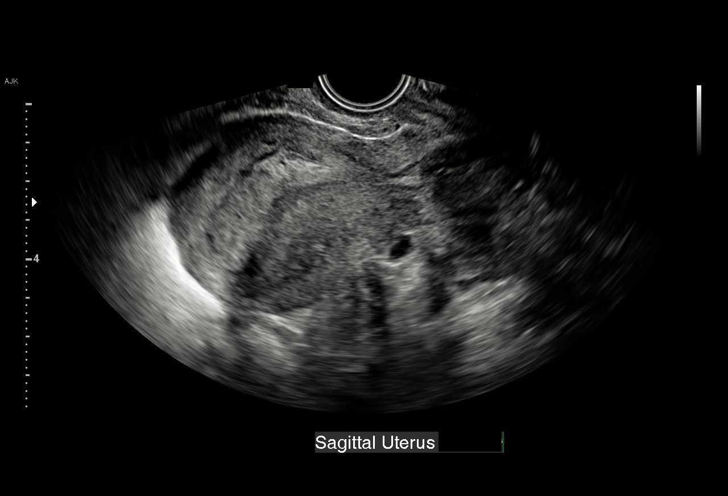
[im 4/42]
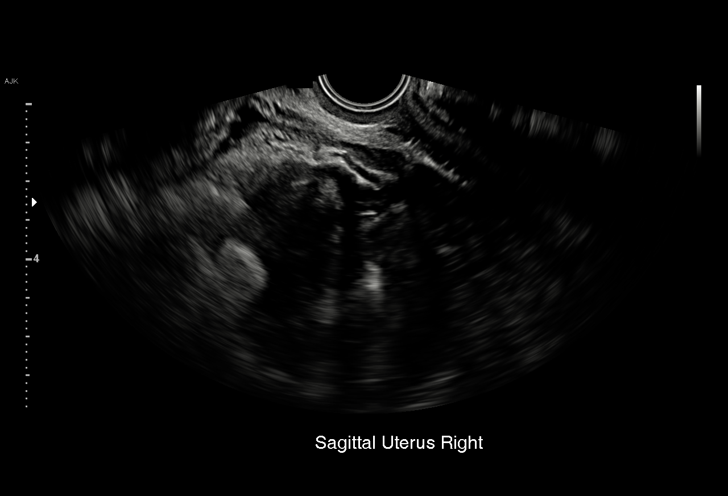
[im 7/42]
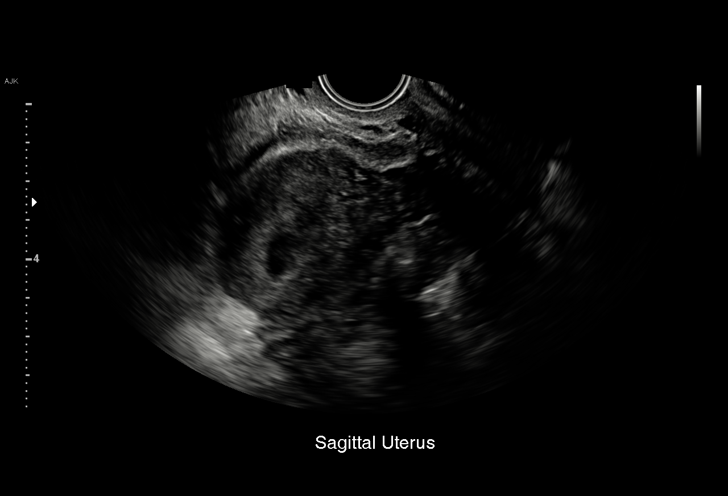
[im 10/42]
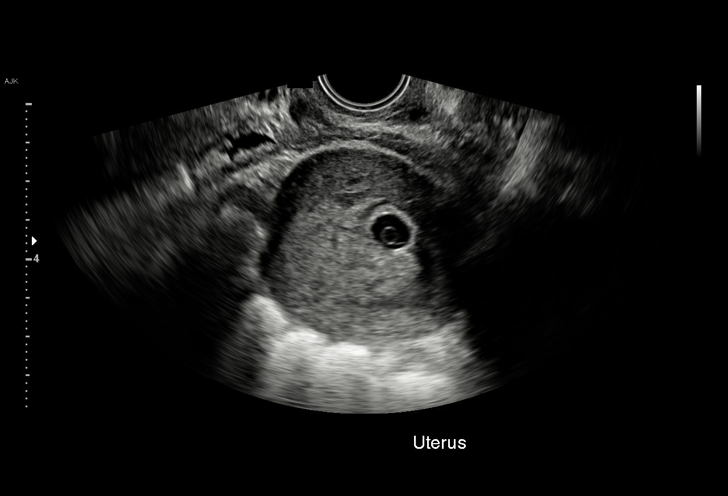
[im 13/42]
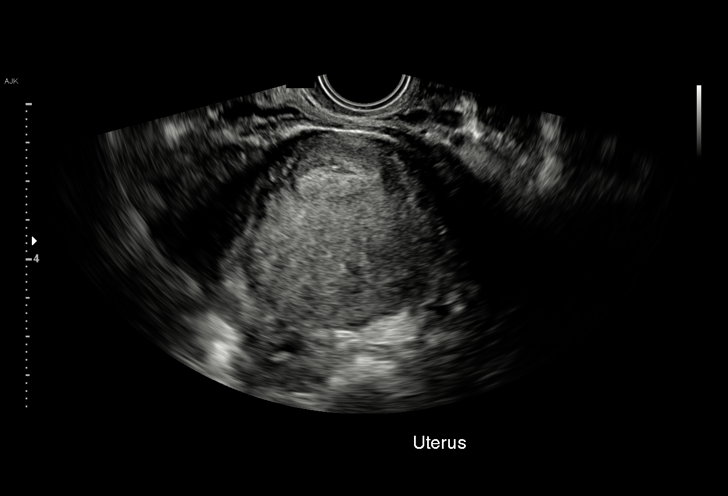
[im 16/42]
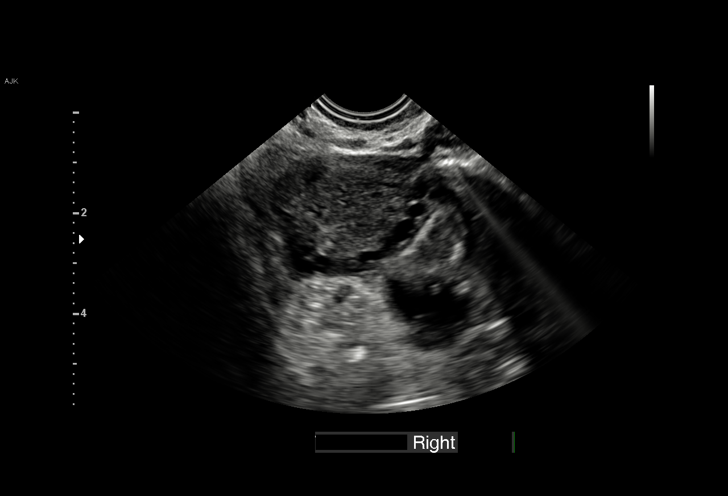
[im 19/42]
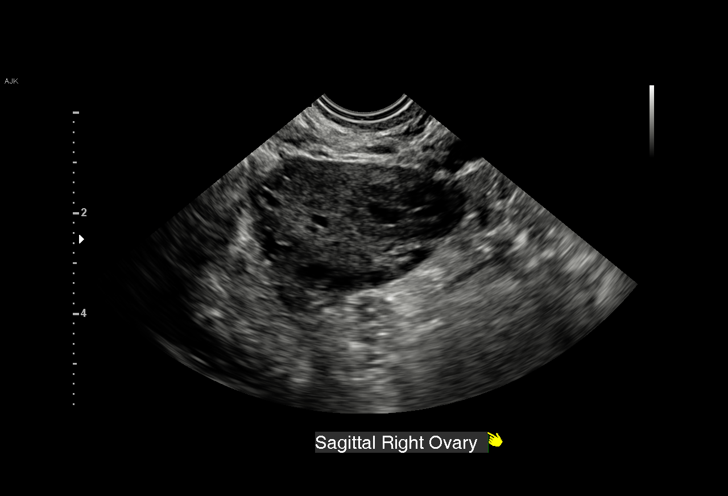
[im 22/42]
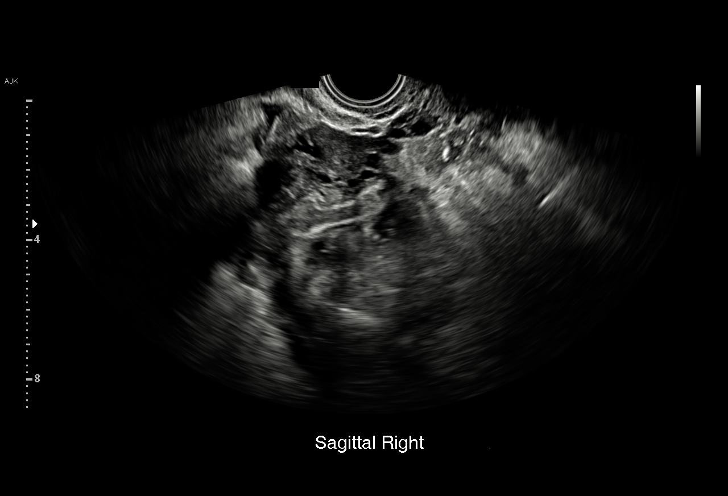
[im 23/42]
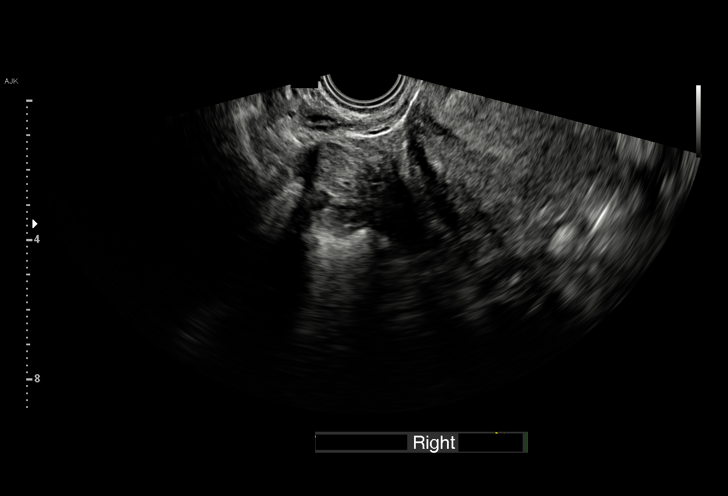
[im 26/42]
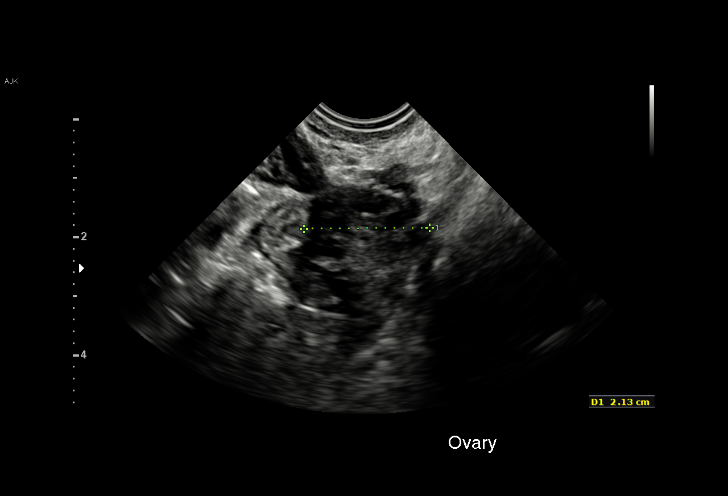
[im 29/42]
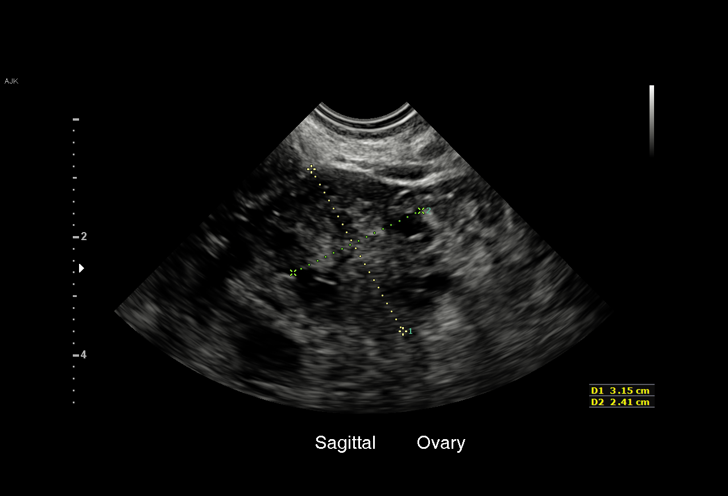
[im 32/42]
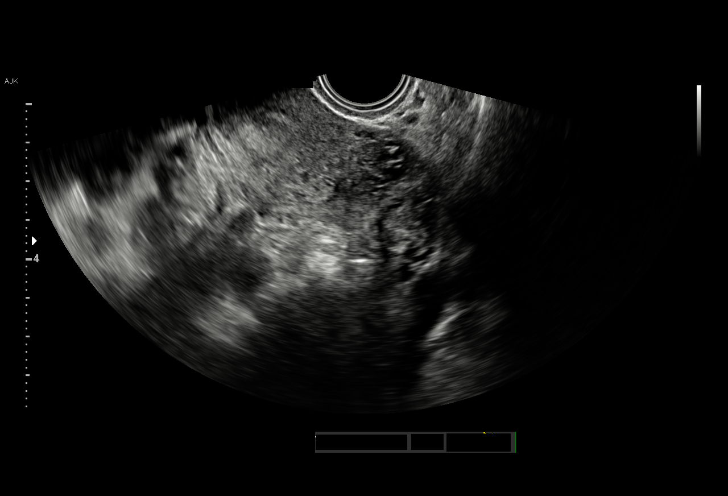
[im 35/42]
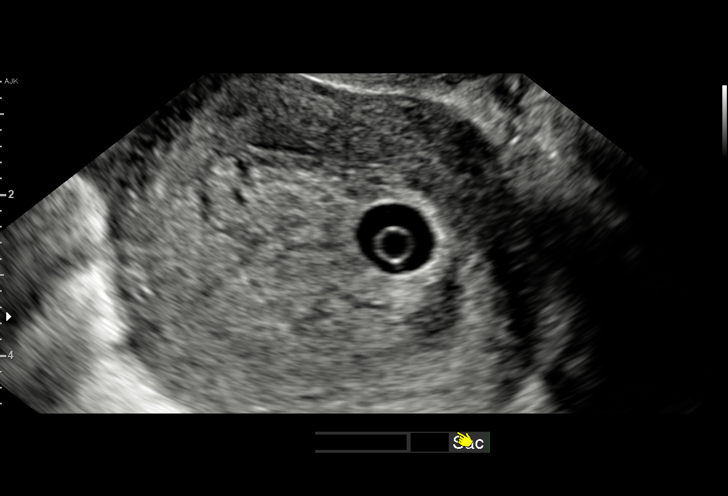
[im 38/42]
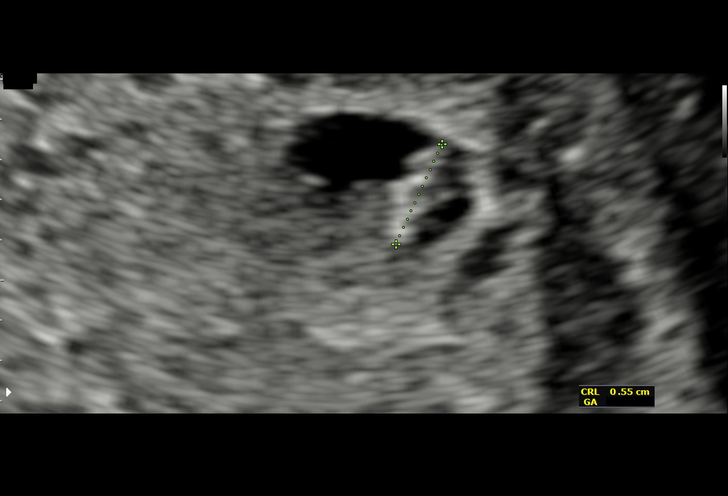
[im 42/42]
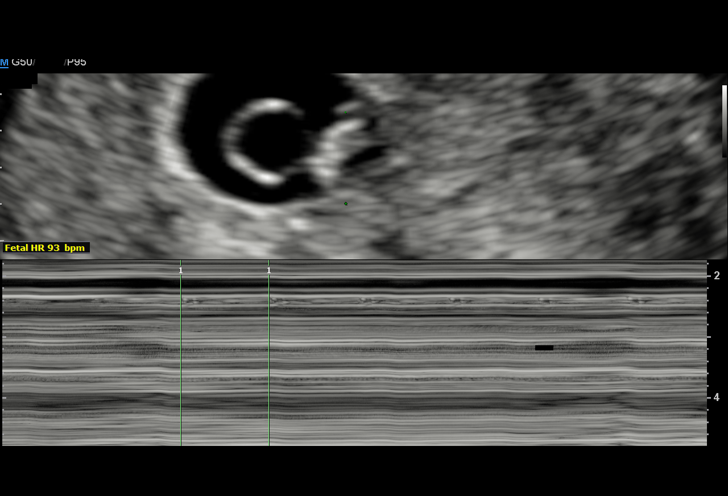

[15 of 28 positions shown; findings below may reference images not displayed]

FINDINGS: Intrauterine gestational sac: Single

Yolk sac:  Visualized.

Embryo:  Visualized.

Cardiac Activity: Visualized.

Heart Rate: 95 bpm

CRL:   5  mm   6 w 1 d                  US EDC: 05/05/2017

Subchorionic hemorrhage:  None visualized.

Maternal uterus/adnexae: Normal appearance of both ovaries. No mass
or abnormal free fluid identified.
IMPRESSION: Single living IUP measuring 6 weeks 1 day, with US EDC of
05/05/2017.

No significant maternal uterine or adnexal abnormality identified.

## 2018-01-22 ENCOUNTER — Other Ambulatory Visit: Payer: Self-pay

## 2018-01-22 ENCOUNTER — Encounter (HOSPITAL_COMMUNITY): Payer: Self-pay | Admitting: Emergency Medicine

## 2018-01-22 ENCOUNTER — Emergency Department (HOSPITAL_COMMUNITY)
Admission: EM | Admit: 2018-01-22 | Discharge: 2018-01-22 | Disposition: A | Discharge status: Home / Self Care | Payer: Managed Care, Other (non HMO) | Attending: Emergency Medicine | Admitting: Emergency Medicine

## 2018-01-22 DIAGNOSIS — M25511 Pain in right shoulder: Secondary | ICD-10-CM | POA: Diagnosis not present

## 2018-01-22 DIAGNOSIS — F17210 Nicotine dependence, cigarettes, uncomplicated: Secondary | ICD-10-CM | POA: Diagnosis not present

## 2018-01-22 DIAGNOSIS — M25512 Pain in left shoulder: Secondary | ICD-10-CM | POA: Diagnosis present

## 2018-01-22 DIAGNOSIS — Y92410 Unspecified street and highway as the place of occurrence of the external cause: Secondary | ICD-10-CM | POA: Diagnosis not present

## 2018-01-22 DIAGNOSIS — Z79899 Other long term (current) drug therapy: Secondary | ICD-10-CM | POA: Insufficient documentation

## 2018-01-22 DIAGNOSIS — Y999 Unspecified external cause status: Secondary | ICD-10-CM | POA: Diagnosis not present

## 2018-01-22 DIAGNOSIS — Y9389 Activity, other specified: Secondary | ICD-10-CM | POA: Insufficient documentation

## 2018-01-22 LAB — PREGNANCY, URINE: Preg Test, Ur: NEGATIVE

## 2018-01-22 MED ORDER — ACETAMINOPHEN 500 MG PO TABS
1000.0000 mg | ORAL_TABLET | Freq: Once | ORAL | Status: AC
  Administered 2018-01-22: 1000 mg via ORAL
  Filled 2018-01-22: qty 2

## 2018-01-22 MED ORDER — OXYCODONE HCL 5 MG PO TABS
5.0000 mg | ORAL_TABLET | Freq: Once | ORAL | Status: DC
  Filled 2018-01-22: qty 1

## 2018-01-22 NOTE — ED Notes (Signed)
Reviewed d/c instructions with pt, who verbalized understanding and had no outstanding questions. Pt departed in NAD, refused use of wheelchair.   

## 2018-01-22 NOTE — Discharge Instructions (Signed)
Take 4 over the counter ibuprofen tablets 3 times a day or 2 over-the-counter naproxen tablets twice a day for pain. Also take tylenol 1000mg(2 extra strength) four times a day.    
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
## 2018-05-11 NOTE — L&D Delivery Note (Signed)
Delivery Note At 7:47 PM a viable female was delivered via Vaginal, Spontaneous (Presentation: LOA).  APGAR: 9, 9; weight pending.   Placenta status: spontaneous, intact.  Cord: 3 vessels    Anesthesia:  epidural Episiotomy: None Lacerations: Sulcus Suture Repair: 3.0 vicryl Est. Blood Loss (mL): 391  Mom to postpartum.  Baby to Couplet care / Skin to Skin.  Wende Mott CNM 03/25/2019, 8:17 PM

## 2018-06-21 ENCOUNTER — Encounter (HOSPITAL_COMMUNITY): Payer: Self-pay | Admitting: Emergency Medicine

## 2018-06-21 ENCOUNTER — Ambulatory Visit (HOSPITAL_COMMUNITY)
Admission: EM | Admit: 2018-06-21 | Discharge: 2018-06-21 | Disposition: A | Discharge status: Home / Self Care | Payer: Managed Care, Other (non HMO) | Attending: Family Medicine | Admitting: Family Medicine

## 2018-06-21 DIAGNOSIS — Z113 Encounter for screening for infections with a predominantly sexual mode of transmission: Secondary | ICD-10-CM

## 2018-06-21 DIAGNOSIS — N3090 Cystitis, unspecified without hematuria: Secondary | ICD-10-CM | POA: Diagnosis present

## 2018-06-21 LAB — POCT URINALYSIS DIP (DEVICE)
Bilirubin Urine: NEGATIVE
Glucose, UA: NEGATIVE mg/dL
Ketones, ur: NEGATIVE mg/dL
Nitrite: POSITIVE — AB
PH: 7 (ref 5.0–8.0)
PROTEIN: NEGATIVE mg/dL
SPECIFIC GRAVITY, URINE: 1.015 (ref 1.005–1.030)
Urobilinogen, UA: 0.2 mg/dL (ref 0.0–1.0)

## 2018-06-21 LAB — POCT PREGNANCY, URINE: Preg Test, Ur: NEGATIVE

## 2018-06-21 MED ORDER — CEPHALEXIN 500 MG PO CAPS: 500.0000 mg | ORAL_CAPSULE | Freq: Two times a day (BID) | ORAL | 0 refills | Status: DC

## 2018-06-21 NOTE — Discharge Instructions (Addendum)
We have sent testing for sexually transmitted infections. We will notify you of any positive results once they are received. If required, we will prescribe any medications you might need.  Please refrain from all sexual activity for at least the next seven days.  

## 2018-06-21 NOTE — ED Triage Notes (Signed)
Pt presents to [facility] for assessment of urinary frequency, left flank pain.  Denies blood in urine, denies painful urination.

## 2018-06-21 NOTE — ED Provider Notes (Signed)
[place]   [id number] 06/21/18 Arrival Time: 1497  ASSESSMENT & PLAN:  1. Cystitis   2. Screening for STD (sexually transmitted disease)    Will treat for probable UTI. She declines empiric gonorrhea/chlamydia tx. Prefers to await vaginal cytology results.  Meds ordered this encounter  Medications  . cephALEXin (KEFLEX) 500 MG capsule    Sig: Take 1 capsule (500 mg total) by mouth 2 (two) times daily.    Dispense:  10 capsule    Refill:  0     Discharge Instructions     We have sent testing for sexually transmitted infections. We will notify you of any positive results once they are received. If required, we will prescribe any medications you might need.  Please refrain from all sexual activity for at least the next seven days.     Pending: Labs Reviewed  POCT URINALYSIS DIP (DEVICE) - Abnormal; Notable for the following components:      Result Value   Hgb urine dipstick TRACE (*)    Nitrite POSITIVE (*)    Leukocytes,Ua SMALL (*)    All other components within normal limits  URINE CULTURE  POC URINE PREG, ED  POCT PREGNANCY, URINE  CERVICOVAGINAL ANCILLARY ONLY    Will notify of any positive results. Instructed to refrain from sexual activity for at least seven days.  Reviewed expectations re: course of current medical issues. Questions answered. Outlined signs and symptoms indicating need for more acute intervention. Patient verbalized understanding. After Visit Summary given.   SUBJECTIVE:  Katie Powell is a 28 y.o. female who presents with complaint of urinary frequency and mild dysuria. No vaginal discharge. Onset gradual. First noticed a couple of days ago. Afebrile. No abdominal or pelvic pain. No n/v. No rashes or lesions. Sexually active with single female partner. Requests STI screening. OTC treatment: none.  Patient's last menstrual period was 05/22/2018.  ROS: As per HPI.  OBJECTIVE:  Vitals:   06/21/18 1418  BP: 124/65    Pulse: 73  Resp: 16  Temp: 98.2 F (36.8 C)  SpO2: 100%     General appearance: alert, cooperative, appears stated age and no distress Throat: lips, mucosa, and tongue normal; teeth and gums normal CV: RRR Lungs: CTAB Back: no CVA tenderness; FROM at waist Abdomen: soft, non-tender GU: deferred Skin: warm and dry Psychological: alert and cooperative; normal mood and affect.  Results for orders placed or performed during the hospital encounter of 06/21/18  POCT urinalysis dip (device)  Result Value Ref Range   Glucose, UA NEGATIVE NEGATIVE mg/dL   Bilirubin Urine NEGATIVE NEGATIVE   Ketones, ur NEGATIVE NEGATIVE mg/dL   Specific Gravity, Urine 1.015 1.005 - 1.030   Hgb urine dipstick TRACE (A) NEGATIVE   pH 7.0 5.0 - 8.0   Protein, ur NEGATIVE NEGATIVE mg/dL   Urobilinogen, UA 0.2 0.0 - 1.0 mg/dL   Nitrite POSITIVE (A) NEGATIVE   Leukocytes,Ua SMALL (A) NEGATIVE  Pregnancy, urine POC  Result Value Ref Range   Preg Test, Ur NEGATIVE NEGATIVE    Labs Reviewed  POCT URINALYSIS DIP (DEVICE) - Abnormal; Notable for the following components:      Result Value   Hgb urine dipstick TRACE (*)    Nitrite POSITIVE (*)    Leukocytes,Ua SMALL (*)    All other components within normal limits  URINE CULTURE  POC URINE PREG, ED  POCT PREGNANCY, URINE  CERVICOVAGINAL ANCILLARY ONLY    No Known Allergies  Past Medical History:  Diagnosis Date  . Anemia   . Anxiety     Social History   Socioeconomic History  . Marital status: Single    Spouse name: Not on file  . Number of children: Not on file  . Years of education: Not on file  . Highest education level: Not on file  Occupational History  . Not on file  Social Needs  . Financial resource strain: Not on file  . Food insecurity:    Worry: Not on file    Inability: Not on file  . Transportation needs:    Medical: Not on file    Non-medical: Not on file  Tobacco Use  . Smoking status: Current Every Day Smoker     Packs/day: 0.50    Types: Cigarettes  . Smokeless tobacco: Never Used  Substance and Sexual Activity  . Alcohol use: Yes    Comment: occasionally  . Drug use: No  . Sexual activity: Not Currently    Birth control/protection: Injection  Lifestyle  . Physical activity:    Days per week: Not on file    Minutes per session: Not on file  . Stress: Not on file  Relationships  . Social connections:    Talks on phone: Not on file    Gets together: Not on file    Attends religious service: Not on file    Active member of club or organization: Not on file    Attends meetings of clubs or organizations: Not on file    Relationship status: Not on file  . Intimate partner violence:    Fear of current or ex partner: Not on file    Emotionally abused: Not on file    Physically abused: Not on file    Forced sexual activity: Not on file  Other Topics Concern  . Not on file  Social History Narrative  . Not on file          Vanessa Kick, MD 06/22/18 [phone]

## 2018-06-22 LAB — CERVICOVAGINAL ANCILLARY ONLY
Bacterial vaginitis: POSITIVE — AB
Chlamydia: NEGATIVE
Neisseria Gonorrhea: NEGATIVE
Trichomonas: NEGATIVE

## 2018-06-23 ENCOUNTER — Telehealth (HOSPITAL_COMMUNITY): Payer: Self-pay | Admitting: Emergency Medicine

## 2018-06-23 MED ORDER — METRONIDAZOLE 500 MG PO TABS: 500.0000 mg | ORAL_TABLET | Freq: Two times a day (BID) | ORAL | 0 refills | Status: AC

## 2018-06-23 NOTE — Telephone Encounter (Signed)
Bacterial vaginosis is positive. This was not treated at the urgent care visit.  Flagyl 500 mg BID x 7 days #14 no refills sent to patients pharmacy of choice.    Patient contacted and made aware of all results, all questions answered.  Spoke with patient about UTI, that she does have a UTI and is treated with keflex, still waiting on sensitiviies. Told her I wont call her back unless she needs to switch antibiotics. Pt agreeable to plan.

## 2018-06-24 LAB — URINE CULTURE: Culture: >=100000 — AB

## 2018-08-01 ENCOUNTER — Inpatient Hospital Stay (HOSPITAL_COMMUNITY)
Admission: AD | Admit: 2018-08-01 | Discharge: 2018-08-01 | Disposition: A | Discharge status: Home / Self Care | Payer: Managed Care, Other (non HMO) | Attending: Obstetrics and Gynecology | Admitting: Obstetrics and Gynecology

## 2018-08-01 ENCOUNTER — Inpatient Hospital Stay (HOSPITAL_COMMUNITY): Payer: Managed Care, Other (non HMO)

## 2018-08-01 ENCOUNTER — Encounter (HOSPITAL_COMMUNITY): Payer: Self-pay | Admitting: *Deleted

## 2018-08-01 ENCOUNTER — Other Ambulatory Visit: Payer: Self-pay

## 2018-08-01 DIAGNOSIS — O99011 Anemia complicating pregnancy, first trimester: Secondary | ICD-10-CM | POA: Insufficient documentation

## 2018-08-01 DIAGNOSIS — Z3A01 Less than 8 weeks gestation of pregnancy: Secondary | ICD-10-CM | POA: Diagnosis not present

## 2018-08-01 DIAGNOSIS — O26891 Other specified pregnancy related conditions, first trimester: Secondary | ICD-10-CM | POA: Diagnosis present

## 2018-08-01 DIAGNOSIS — N83291 Other ovarian cyst, right side: Secondary | ICD-10-CM | POA: Diagnosis not present

## 2018-08-01 DIAGNOSIS — D649 Anemia, unspecified: Secondary | ICD-10-CM | POA: Insufficient documentation

## 2018-08-01 DIAGNOSIS — O208 Other hemorrhage in early pregnancy: Secondary | ICD-10-CM | POA: Diagnosis not present

## 2018-08-01 DIAGNOSIS — R1030 Lower abdominal pain, unspecified: Secondary | ICD-10-CM | POA: Insufficient documentation

## 2018-08-01 DIAGNOSIS — O99331 Smoking (tobacco) complicating pregnancy, first trimester: Secondary | ICD-10-CM | POA: Diagnosis not present

## 2018-08-01 DIAGNOSIS — F17210 Nicotine dependence, cigarettes, uncomplicated: Secondary | ICD-10-CM | POA: Diagnosis not present

## 2018-08-01 DIAGNOSIS — R109 Unspecified abdominal pain: Secondary | ICD-10-CM

## 2018-08-01 DIAGNOSIS — Z3491 Encounter for supervision of normal pregnancy, unspecified, first trimester: Secondary | ICD-10-CM

## 2018-08-01 LAB — CBC
HCT: 25.3 % — ABNORMAL LOW (ref 36.0–46.0)
Hemoglobin: 7.2 g/dL — ABNORMAL LOW (ref 12.0–15.0)
MCH: 18.1 pg — ABNORMAL LOW (ref 26.0–34.0)
MCHC: 28.5 g/dL — ABNORMAL LOW (ref 30.0–36.0)
MCV: 63.6 fL — ABNORMAL LOW (ref 80.0–100.0)
Platelets: 406 10*3/uL — ABNORMAL HIGH (ref 150–400)
RBC: 3.98 MIL/uL (ref 3.87–5.11)
RDW: 20.5 % — ABNORMAL HIGH (ref 11.5–15.5)
WBC: 5.8 10*3/uL (ref 4.0–10.5)
nRBC: 0 % (ref 0.0–0.2)

## 2018-08-01 LAB — URINALYSIS, ROUTINE W REFLEX MICROSCOPIC
Bilirubin Urine: NEGATIVE
Glucose, UA: NEGATIVE mg/dL
Hgb urine dipstick: NEGATIVE
Ketones, ur: NEGATIVE mg/dL
LEUKOCYTE UA: NEGATIVE
Nitrite: NEGATIVE
Protein, ur: NEGATIVE mg/dL
Specific Gravity, Urine: 1.015 (ref 1.005–1.030)
pH: 8 (ref 5.0–8.0)

## 2018-08-01 LAB — POCT PREGNANCY, URINE: Preg Test, Ur: POSITIVE — AB

## 2018-08-01 LAB — HCG, QUANTITATIVE, PREGNANCY: HCG, BETA CHAIN, QUANT, S: 15358 m[IU]/mL — AB (ref <5)

## 2018-08-01 NOTE — MAU Note (Signed)
+  HPT 2 days.  Has been cramping a lot.  Wants to make sure everything is ok.  Hx of miscarriage.

## 2018-08-01 NOTE — MAU Provider Note (Signed)
Chief Complaint: Abdominal Pain and Possible Pregnancy   First Provider Initiated Contact with Patient 08/01/18 1201     SUBJECTIVE HPI: Katie Powell is a 28 y.o. G2P0010 at [redacted]w[redacted]d who presents to Maternity Admissions reporting abdominal pain. Had positive HPT 2 days ago. LMP 2/14 & was normal for her.  Reports intermittent lower abdominal cramping for the last several days. Denies n/v/d, constipation, dysuria, vaginal bleeding, or vaginal discharge.   Location: abdomen Quality: cramping Severity: 3/10 on pain scale Duration: 3 days Timing: intermittent Modifying factors: none Associated signs and symptoms: none  Past Medical History:  Diagnosis Date  . Anemia   . Anxiety    OB History  Gravida Para Term Preterm AB Living  2       1    SAB TAB Ectopic Multiple Live Births  1 0          # Outcome Date GA Lbr Len/2nd Weight Sex Delivery Anes PTL Lv  2 Current           1 SAB 09/2016           Past Surgical History:  Procedure Laterality Date  . EYE SURGERY    . FINGER SURGERY     Social History   Socioeconomic History  . Marital status: Single    Spouse name: Not on file  . Number of children: Not on file  . Years of education: Not on file  . Highest education level: Not on file  Occupational History  . Not on file  Social Needs  . Financial resource strain: Not on file  . Food insecurity:    Worry: Not on file    Inability: Not on file  . Transportation needs:    Medical: Not on file    Non-medical: Not on file  Tobacco Use  . Smoking status: Current Every Day Smoker    Packs/day: 0.50    Types: Cigarettes  . Smokeless tobacco: Never Used  Substance and Sexual Activity  . Alcohol use: Yes    Comment: occasionally  . Drug use: No  . Sexual activity: Not Currently    Birth control/protection: Injection  Lifestyle  . Physical activity:    Days per week: Not on file    Minutes per session: Not on file  . Stress: Not on file  Relationships  . Social  connections:    Talks on phone: Not on file    Gets together: Not on file    Attends religious service: Not on file    Active member of club or organization: Not on file    Attends meetings of clubs or organizations: Not on file    Relationship status: Not on file  . Intimate partner violence:    Fear of current or ex partner: Not on file    Emotionally abused: Not on file    Physically abused: Not on file    Forced sexual activity: Not on file  Other Topics Concern  . Not on file  Social History Narrative  . Not on file   No family history on file. No current facility-administered medications on file prior to encounter.    Current Outpatient Medications on File Prior to Encounter  Medication Sig Dispense Refill  . ferrous sulfate 325 (65 FE) MG tablet Take 1 tablet (325 mg total) by mouth 2 (two) times daily with a meal. 30 tablet 2  . folic acid (FOLVITE) 1 MG tablet Take 1 tablet (1 mg total) by mouth  daily.    . promethazine (PHENERGAN) 12.5 MG tablet Take 1-2 tablets as needed every 6 hours for nausea and vomiting. 30 tablet 0   No Known Allergies  I have reviewed patient's Past Medical Hx, Surgical Hx, Family Hx, Social Hx, medications and allergies.   Review of Systems  Constitutional: Negative.   Gastrointestinal: Positive for abdominal pain. Negative for constipation, diarrhea, nausea and vomiting.  Genitourinary: Negative.     OBJECTIVE Patient Vitals for the past 24 hrs:  BP Temp Temp src Pulse Resp SpO2 Height Weight  08/01/18 1328 107/73 - - - - - - -  08/01/18 1049 117/73 98.8 F (37.1 C) Oral 77 16 100 % 5\' 6"  (1.676 m) 74.4 kg   Constitutional: Well-developed, well-nourished female in no acute distress.  Cardiovascular: normal rate & rhythm, no murmur Respiratory: normal rate and effort. Lung sounds clear throughout GI: Abd soft, non-tender, Pos BS x 4. No guarding or rebound tenderness MS: Extremities nontender, no edema, normal ROM Neurologic: Alert  and oriented x 4.     LAB RESULTS Results for orders placed or performed during the hospital encounter of 08/01/18 (from the past 24 hour(s))  Urinalysis, Routine w reflex microscopic     Status: None   Collection Time: 08/01/18 10:57 AM  Result Value Ref Range   Color, Urine YELLOW YELLOW   APPearance CLEAR CLEAR   Specific Gravity, Urine 1.015 1.005 - 1.030   pH 8.0 5.0 - 8.0   Glucose, UA NEGATIVE NEGATIVE mg/dL   Hgb urine dipstick NEGATIVE NEGATIVE   Bilirubin Urine NEGATIVE NEGATIVE   Ketones, ur NEGATIVE NEGATIVE mg/dL   Protein, ur NEGATIVE NEGATIVE mg/dL   Nitrite NEGATIVE NEGATIVE   Leukocytes,Ua NEGATIVE NEGATIVE  Pregnancy, urine POC     Status: Abnormal   Collection Time: 08/01/18 10:58 AM  Result Value Ref Range   Preg Test, Ur POSITIVE (A) NEGATIVE  CBC     Status: Abnormal   Collection Time: 08/01/18 12:23 PM  Result Value Ref Range   WBC 5.8 4.0 - 10.5 K/uL   RBC 3.98 3.87 - 5.11 MIL/uL   Hemoglobin 7.2 (L) 12.0 - 15.0 g/dL   HCT 25.3 (L) 36.0 - 46.0 %   MCV 63.6 (L) 80.0 - 100.0 fL   MCH 18.1 (L) 26.0 - 34.0 pg   MCHC 28.5 (L) 30.0 - 36.0 g/dL   RDW 20.5 (H) 11.5 - 15.5 %   Platelets 406 (H) 150 - 400 K/uL   nRBC 0.0 0.0 - 0.2 %  hCG, quantitative, pregnancy     Status: Abnormal   Collection Time: 08/01/18 12:23 PM  Result Value Ref Range   hCG, Beta Chain, Quant, S 15,358 (H) <5 mIU/mL    IMAGING US Ob Less Than 14 Weeks With Ob Transvaginal  Result Date: 08/01/2018 CLINICAL DATA:  Abdominal pain and cramping for several days during 1st trimester pregnancy. EXAM: OBSTETRIC <14 WK [place] AND TRANSVAGINAL OB US TECHNIQUE: Both transabdominal and transvaginal ultrasound examinations were performed for complete evaluation of the gestation as well as the maternal uterus, adnexal regions, and pelvic cul-de-sac. Transvaginal technique was performed to assess early pregnancy. COMPARISON:  None. FINDINGS: Intrauterine gestational sac: Single Yolk sac:  Visualized.  Embryo:  Not Visualized. MSD: 10 mm   5 w   5 d Subchorionic hemorrhage:  Small subchorionic hemorrhage noted. Maternal uterus/adnexae: Simple right ovarian cyst measuring 4.2 cm, likely representing a corpus luteum cyst. Normal appearance of left ovary. No suspicious adnexal mass or  abnormal free fluid identified. IMPRESSION: Single intrauterine gestational sac measuring 5 weeks 5 days. Suggest correlation with serial b-hCG levels, and consider followup ultrasound to assess viability in 10 days. Small subchorionic hemorrhage. Electronically Signed   By: Earle Gell M.D.   On: 08/01/2018 12:54    MAU COURSE Orders Placed This Encounter  Procedures  . US OB LESS THAN 14 WEEKS WITH OB TRANSVAGINAL  . Urinalysis, Routine w reflex microscopic  . CBC  . hCG, quantitative, pregnancy  . Pregnancy, urine POC  . Discharge patient   No orders of the defined types were placed in this encounter.   MDM +UPT UA, CBC, quant hCG, and [place] today to rule out ectopic pregnancy  Ultrasound shows IUGS with yolk sac. Ectopic pregnancy ruled out.   Pt with hx of anemia. Currently taking iron supplement sporadically. Asymptomatic. Discussed increasing dose to TID.  ASSESSMENT 1. Normal IUP (intrauterine pregnancy) on prenatal ultrasound, first trimester   2. Abdominal pain during pregnancy in first trimester   3. Anemia affecting pregnancy in first trimester     PLAN Discharge home in stable condition. SAB precautions Start prenatal care Start PNV & increase iron supplement dosing  Allergies as of 08/01/2018   No Known Allergies     Medication List    STOP taking these medications   cephALEXin 500 MG capsule Commonly known as:  KEFLEX   famotidine 20 MG tablet Commonly known as:  PEPCID   ibuprofen 600 MG tablet Commonly known as:  ADVIL,MOTRIN   LORazepam 1 MG tablet Commonly known as:  Ativan   oxyCODONE-acetaminophen 5-325 MG tablet Commonly known as:  Roxicet   predniSONE 10 MG  (21) Tbpk tablet Commonly known as:  STERAPRED UNI-PAK 21 TAB     TAKE these medications   ferrous sulfate 325 (65 FE) MG tablet Take 1 tablet (325 mg total) by mouth 2 (two) times daily with a meal.   folic acid 1 MG tablet Commonly known as:  FOLVITE Take 1 tablet (1 mg total) by mouth daily.   promethazine 12.5 MG tablet Commonly known as:  PHENERGAN Take 1-2 tablets as needed every 6 hours for nausea and vomiting.        Jorje Guild, NP 08/01/2018  1:40 PM

## 2018-08-01 NOTE — Discharge Instructions (Signed)
[place] for [place] at [facility]       Phone: [phone]  [facility] for [place] at [place] Phone: [place] for [place] at [place]  Phone: [place] for [place] at [facility]  Phone: [place] for [place] at [place]  Phone: [facility]       Phone: [phone]  [facility]    Phone: [phone]   [facility])    Phone: [facility]    Phone: [phone]  [facility]    Phone: [place] [facility]  Phone: [place]    Phone: [phone]  [facility]   Phone: [phone]  [facility]    Phone: [phone]    Safe Medications in Pregnancy   Acne: Benzoyl Peroxide Salicylic Acid  Backache/Headache: Tylenol: 2 regular strength every 4 hours OR              2 Extra strength every 6 hours  Colds/Coughs/Allergies: Benadryl (alcohol free) 25 mg every 6 hours as needed Breath right strips Claritin Cepacol throat lozenges Chloraseptic throat spray Cold-Eeze- up to three times per day Cough drops, alcohol free Flonase (by prescription only) Guaifenesin Mucinex Robitussin DM (plain only, alcohol free) Saline nasal spray/drops Sudafed (pseudoephedrine) & Actifed ** use only after [redacted] weeks gestation and if you do not have high blood pressure Tylenol Vicks Vaporub Zinc lozenges Zyrtec   Constipation: Colace Ducolax suppositories Fleet enema Glycerin suppositories Metamucil Milk of magnesia Miralax Senokot Smooth move tea  Diarrhea: Kaopectate Imodium A-D  *NO pepto Bismol  Hemorrhoids: Anusol Anusol HC Preparation  H Tucks  Indigestion: Tums Maalox Mylanta Zantac  Pepcid  Insomnia: Benadryl (alcohol free) '25mg'$  every 6 hours as needed Tylenol PM Unisom, no Gelcaps  Leg Cramps: Tums MagGel  Nausea/Vomiting:  Bonine Dramamine Emetrol Ginger extract Sea bands Meclizine  Nausea medication to take during pregnancy:  Unisom (doxylamine succinate 25 mg tablets) Take one tablet daily at bedtime. If symptoms are not adequately controlled, the dose can be increased to a maximum recommended dose of two tablets daily (1/2 tablet in the morning, 1/2 tablet mid-afternoon and one at bedtime). Vitamin B6 '100mg'$  tablets. Take one tablet twice a day (up to 200 mg per day).  Skin Rashes: Aveeno products Benadryl cream or '25mg'$  every 6 hours as needed Calamine Lotion 1% cortisone cream  Yeast infection: Gyne-lotrimin 7 Monistat 7  Gum/tooth pain: Anbesol  **If taking multiple medications, please check labels to avoid duplicating the same active ingredients **take medication as directed on the label ** Do not exceed 4000 mg of tylenol in 24 hours **Do not take medications that contain aspirin or ibuprofen       Anemia  Anemia is a condition in which you do not have enough red blood cells or hemoglobin. Hemoglobin is a substance in red blood cells that carries oxygen. When you do not have enough red blood cells or hemoglobin (are anemic), your body cannot get enough oxygen and your organs may not work properly. As a result, you may feel very tired or have other problems. What are the causes? Common causes of anemia include:  Excessive bleeding. Anemia can be caused by excessive bleeding inside or outside the body, including bleeding from the intestine or from periods in women.  Poor nutrition.  Long-lasting (chronic) kidney, thyroid, and liver disease.  Bone marrow disorders.  Cancer and treatments for cancer.  HIV (human immunodeficiency virus) and AIDS (acquired immunodeficiency  syndrome).  Treatments for HIV and AIDS.  Spleen problems.  Blood disorders.  Infections, medicines, and autoimmune disorders that destroy red blood cells. What are the signs or symptoms? Symptoms of this condition include:  Minor weakness.  Dizziness.  Headache.  Feeling heartbeats that are irregular or faster than normal (palpitations).  Shortness of breath, especially with exercise.  Paleness.  Cold sensitivity.  Indigestion.  Nausea.  Difficulty sleeping.  Difficulty concentrating. Symptoms may occur suddenly or develop slowly. If your anemia is mild, you may not have symptoms. How is this diagnosed? This condition is diagnosed based on:  Blood tests.  Your medical history.  A physical exam.  Bone marrow biopsy. Your health care provider may also check your stool (feces) for blood and may do additional testing to look for the cause of your bleeding. You may also have other tests, including:  Imaging tests, such as a CT scan or MRI.  Endoscopy.  Colonoscopy. How is this treated? Treatment for this condition depends on the cause. If you continue to lose a lot of blood, you may need to be treated at a hospital. Treatment may include:  Taking supplements of iron, vitamin R75, or folic acid.  Taking a hormone medicine (erythropoietin) that can help to stimulate red blood cell growth.  Having a blood transfusion. This may be needed if you lose a lot of blood.  Making changes to your diet.  Having surgery to remove your spleen. Follow these instructions at home:  Take over-the-counter and prescription medicines only as told by your health care provider.  Take supplements only as told by your health care provider.  Follow any diet instructions that you were given.  Keep all follow-up visits as told by your health care provider. This is important. Contact a health care provider if:  You develop new bleeding anywhere in the body. Get help right  away if:  You are very weak.  You are short of breath.  You have pain in your abdomen or chest.  You are dizzy or feel faint.  You have trouble concentrating.  You have bloody or black, tarry stools.  You vomit repeatedly or you vomit up blood. Summary  Anemia is a condition in which you do not have enough red blood cells or enough of a substance in your red blood cells that carries oxygen (hemoglobin).  Symptoms may occur suddenly or develop slowly.  If your anemia is mild, you may not have symptoms.  This condition is diagnosed with blood tests as well as a medical history and physical exam. Other tests may be needed.  Treatment for this condition depends on the cause of the anemia. This information is not intended to replace advice given to you by your health care provider. Make sure you discuss any questions you have with your health care provider. Document Released: 06/04/2004 Document Revised: 05/29/2016 Document Reviewed: 05/29/2016 Elsevier Interactive Patient Education  2019 [employer].

## 2018-09-06 ENCOUNTER — Encounter: Payer: Self-pay | Admitting: Advanced Practice Midwife

## 2018-09-06 ENCOUNTER — Ambulatory Visit (INDEPENDENT_AMBULATORY_CARE_PROVIDER_SITE_OTHER): Payer: Managed Care, Other (non HMO) | Admitting: Advanced Practice Midwife

## 2018-09-06 ENCOUNTER — Other Ambulatory Visit: Payer: Self-pay

## 2018-09-06 VITALS — BP 117/73 | HR 81 | Wt 168.3 lb

## 2018-09-06 DIAGNOSIS — Z124 Encounter for screening for malignant neoplasm of cervix: Secondary | ICD-10-CM

## 2018-09-06 DIAGNOSIS — Z113 Encounter for screening for infections with a predominantly sexual mode of transmission: Secondary | ICD-10-CM

## 2018-09-06 DIAGNOSIS — Z3A10 10 weeks gestation of pregnancy: Secondary | ICD-10-CM

## 2018-09-06 DIAGNOSIS — N898 Other specified noninflammatory disorders of vagina: Secondary | ICD-10-CM | POA: Diagnosis not present

## 2018-09-06 DIAGNOSIS — N760 Acute vaginitis: Secondary | ICD-10-CM | POA: Diagnosis not present

## 2018-09-06 DIAGNOSIS — O99331 Smoking (tobacco) complicating pregnancy, first trimester: Secondary | ICD-10-CM

## 2018-09-06 DIAGNOSIS — O219 Vomiting of pregnancy, unspecified: Secondary | ICD-10-CM

## 2018-09-06 DIAGNOSIS — B373 Candidiasis of vulva and vagina: Secondary | ICD-10-CM

## 2018-09-06 DIAGNOSIS — B9689 Other specified bacterial agents as the cause of diseases classified elsewhere: Secondary | ICD-10-CM | POA: Diagnosis not present

## 2018-09-06 DIAGNOSIS — Z3480 Encounter for supervision of other normal pregnancy, unspecified trimester: Secondary | ICD-10-CM | POA: Insufficient documentation

## 2018-09-06 MED ORDER — NICOTINE 7 MG/24HR TD PT24: 7.0000 mg | MEDICATED_PATCH | Freq: Every day | TRANSDERMAL | 1 refills | Status: DC

## 2018-09-06 MED ORDER — DOXYLAMINE-PYRIDOXINE 10-10 MG PO TBEC: DELAYED_RELEASE_TABLET | ORAL | 5 refills | Status: DC

## 2018-09-06 NOTE — Progress Notes (Signed)
Pt presents for NOB visit. This isn't a planned pregnancy but FOB is supportive  Pt is smoking 6 cigarettes a day and questions is the patch safe to use during pregnancy.

## 2018-09-06 NOTE — Patient Instructions (Signed)
Morning Sickness  Morning sickness is when a woman feels nauseous during pregnancy. This nauseous feeling may or may not come with vomiting. It often occurs in the morning, but it can be a problem at any time of day. Morning sickness is most common during the first trimester. In some cases, it may continue throughout pregnancy. Although morning sickness is unpleasant, it is usually harmless unless the woman develops severe and continual vomiting (hyperemesis gravidarum), a condition that requires more intense treatment. What are the causes? The exact cause of this condition is not known, but it seems to be related to normal hormonal changes that occur in pregnancy. What increases the risk? You are more likely to develop this condition if:  You experienced nausea or vomiting before your pregnancy.  You had morning sickness during a previous pregnancy.  You are pregnant with more than one baby, such as twins. What are the signs or symptoms? Symptoms of this condition include:  Nausea.  Vomiting. How is this diagnosed? This condition is usually diagnosed based on your signs and symptoms. How is this treated? In many cases, treatment is not needed for this condition. Making some changes to what you eat may help to control symptoms. Your health care provider may also prescribe or recommend:  Vitamin B6 supplements.  Anti-nausea medicines.  Ginger. Follow these instructions at home: Medicines  Take over-the-counter and prescription medicines only as told by your health care provider. Do not use any prescription, over-the-counter, or herbal medicines for morning sickness without first talking with your health care provider.  Taking multivitamins before getting pregnant can prevent or decrease the severity of morning sickness in most women. Eating and drinking  Eat a piece of dry toast or crackers before getting out of bed in the morning.  Eat 5 or 6 small meals a day.  Eat dry and  bland foods, such as rice or a baked potato. Foods that are high in carbohydrates are often helpful.  Avoid greasy, fatty, and spicy foods.  Have someone cook for you if the smell of any food causes nausea and vomiting.  If you feel nauseous after taking prenatal vitamins, take the vitamins at night or with a snack.  Snack on protein foods between meals if you are hungry. Nuts, yogurt, and cheese are good options.  Drink fluids throughout the day.  Try ginger ale made with real ginger, ginger tea made from fresh grated ginger, or ginger candies. General instructions  Do not use any products that contain nicotine or tobacco, such as cigarettes and e-cigarettes. If you need help quitting, ask your health care provider.  Get an air purifier to keep the air in your house free of odors.  Get plenty of fresh air.  Try to avoid odors that trigger your nausea.  Consider trying these methods to help relieve symptoms: ? Wearing an acupressure wristband. These wristbands are often worn for seasickness. ? Acupuncture. Contact a health care provider if:  Your home remedies are not working and you need medicine.  You feel dizzy or light-headed.  You are losing weight. Get help right away if:  You have persistent and uncontrolled nausea and vomiting.  You faint.  You have severe pain in your abdomen. Summary  Morning sickness is when a woman feels nauseous during pregnancy. This nauseous feeling may or may not come with vomiting.  Morning sickness is most common during the first trimester.  It often occurs in the morning, but it can be a problem at  any time of day.  In many cases, treatment is not needed for this condition. Making some changes to what you eat may help to control symptoms. This information is not intended to replace advice given to you by your health care provider. Make sure you discuss any questions you have with your health care provider. Document Released:  06/18/2006 Document Revised: 05/30/2016 Document Reviewed: 05/30/2016 Elsevier Interactive Patient Education  2019 [employer].   Smoking During Pregnancy  Smoking during pregnancy is unhealthy for you and your baby. Smoke from cigarettes, pipes, and cigars contains many chemicals that can cause cancer (carcinogens). Cigarettes also contain a stimulant drug (nicotine). When you smoke, harmful substances that you breathe in enter your bloodstream and can be passed on to your baby. This can affect your baby's development. If you are planning to become pregnant or have recently become pregnant, talk with your health care provider about quitting smoking. How does smoking affect me? Smoking increases your risk for many long-term (chronic) diseases. These diseases include cancer, lung diseases, and heart disease. Smoking during pregnancy increases your risk of:  Losing the pregnancy (miscarriage or stillbirth).  Giving birth too early (premature birth).  Pregnancy outside of the uterus (tubal pregnancy).  Having problems with the organ that provides the baby nourishment and oxygen (placenta), including: ? Attachment of the placenta over the opening of the uterus (placenta previa). ? Detachment of the placenta before the baby's birth (placental abruption).  Having your water break before labor begins (premature rupture of membranes). How does smoking affect my baby? Before Birth Smoking during pregnancy:  Decreases blood flow and oxygen to your baby.  Increases your baby's risk of birth defects, such as heart defects.  Increases your baby's heart rate.  Slows your baby's growth in the uterus (intrauterine growth retardation). After Birth Babies born to women who smoked during pregnancy may:  Have symptoms of nicotine withdrawal.  Need to stay in the hospital for special care.  May be too small at birth.  Have a high risk of: ? Serious health problems or lifelong disabilities. ?  Sudden infant death syndrome (SIDS). ? Becoming obese. ? Developing behavior or learning problems. What can happen if changes are not made? When babies are born with a birth defect or illness, they often need to stay in the hospital longer before going home. Hospital stays may also be longer if you had any complications during labor or delivery. Longer hospital stays and more treatments result in higher costs for health care. Many health issues among babies born to mothers who smoke can have a lifelong impact. This may include the long-term need for certain medicines, therapies, or other treatments. What are the benefits of not smoking during pregnancy? You have a much better chance of having a healthy pregnancy and a healthy baby if you do not smoke while you are pregnant. Not smoking also means that you will have a better chance of living a long and healthy life, and your baby will have a better chance of growing into a healthy child and adult. What actions can be taken? Quitting smoking can be difficult. Ask your health care provider for help to stop smoking. You may also consider:  Counseling to help you quit smoking (smoking cessation counseling).  Psychotherapy.  Acupuncture.  Hypnosis.  Telephone [employer]. If these methods do not help you, talk with your health care provider about other options. Do not take smoking cessation medicines or nicotine supplements unless your health care provider  tells you to. Where to find more information Learn more about smoking during pregnancy and quitting smoking from:  March of Dimes: www.marchofdimes.org/pregnancy/smoking-during-pregnancy.aspx  U.S. Department of Health and Human Services: women.smokefree.gov  American Cancer Society: www.cancer.org  American Heart Association: www.heart.[place]: www.cancer.gov For help to quit smoking:  National smoking cessation telephone hotline: 1-800-QUIT NOW 940-036-5743)  Contact a health care provider if:  You are struggling to quit smoking.  You are a smoker and you become pregnant or plan to become pregnant.  You start smoking again after giving birth. Summary  Tobacco smoke contains harmful substances that can affect a baby's health and development.  Smoking increases the risk for serious problems, such as miscarriage, birth defects, or premature birth.  If you need help to quit smoking, ask your health care provider. This information is not intended to replace advice given to you by your health care provider. Make sure you discuss any questions you have with your health care provider. Document Released: 09/08/2004 Document Revised: 02/14/2016 Document Reviewed: 02/14/2016 Elsevier Interactive Patient Education  2019 [employer].

## 2018-09-06 NOTE — Progress Notes (Signed)
Subjective:   Katie Powell is a 28 y.o. G2P0010 at [redacted]w[redacted]d by sure LMP being seen today for her first obstetrical visit.  Her obstetrical history is significant for smoker and first trimester SAB x 1 and has Symptomatic anemia; Positive pregnancy test; Iron deficiency anemia due to chronic blood loss; Thrombocytosis ([place]); Missed abortion; and Supervision of other normal pregnancy, antepartum on their problem list.. Patient does intend to breast feed. Pregnancy history fully reviewed.  Patient reports nausea.  HISTORY: OB History  Gravida Para Term Preterm AB Living  2 0 0 0 1 0  SAB TAB Ectopic Multiple Live Births  1 0 0 0 0    # Outcome Date GA Lbr Len/2nd Weight Sex Delivery Anes PTL Lv  2 Current           1 SAB 09/2016           Past Medical History:  Diagnosis Date  . Anemia   . Anxiety    Past Surgical History:  Procedure Laterality Date  . EYE SURGERY    . FINGER SURGERY     No family history on file. Social History   Tobacco Use  . Smoking status: Current Every Day Smoker    Packs/day: 0.50    Types: Cigarettes  . Smokeless tobacco: Never Used  Substance Use Topics  . Alcohol use: Yes    Comment: occasionally  . Drug use: No   No Known Allergies Current Outpatient Medications on File Prior to Visit  Medication Sig Dispense Refill  . ferrous sulfate 325 (65 FE) MG tablet Take 1 tablet (325 mg total) by mouth 2 (two) times daily with a meal. 30 tablet 2  . folic acid (FOLVITE) 1 MG tablet Take 1 tablet (1 mg total) by mouth daily.    . Prenatal Vit w/Fe-Methylfol-FA (PNV PO) Take by mouth.    . promethazine (PHENERGAN) 12.5 MG tablet Take 1-2 tablets as needed every 6 hours for nausea and vomiting. (Patient not taking: Reported on 09/06/2018) 30 tablet 0   No current facility-administered medications on file prior to visit.      Exam   Vitals:   09/06/18 1322  BP: 117/73  Pulse: 81  Weight: 76.3 kg      Uterus:     Pelvic Exam: Perineum:  no hemorrhoids, normal perineum   Vulva: normal external genitalia, no lesions   Vagina:  normal mucosa, normal discharge   Cervix: no lesions and normal, pap smear done.    Adnexa: normal adnexa and no mass, fullness, tenderness   Bony Pelvis: average  System: General: well-developed, well-nourished female in no acute distress   Breast:  normal appearance, no masses or tenderness   Skin: normal coloration and turgor, no rashes   Neurologic: oriented, normal, negative, normal mood   Extremities: normal strength, tone, and muscle mass, ROM of all joints is normal   HEENT PERRLA, extraocular movement intact and sclera clear, anicteric   Mouth/Teeth mucous membranes moist, pharynx normal without lesions and dental hygiene good   Neck supple and no masses   Cardiovascular: regular rate and rhythm   Respiratory:  no respiratory distress, normal breath sounds   Abdomen: soft, non-tender; bowel sounds normal; no masses,  no organomegaly     Assessment:   Pregnancy: G2P0010 Patient Active Problem List   Diagnosis Date Noted  . Supervision of other normal pregnancy, antepartum 09/06/2018  . Missed abortion 09/22/2016  . Symptomatic anemia 08/20/2016  . Positive  pregnancy test 08/20/2016  . Iron deficiency anemia due to chronic blood loss 08/20/2016  . Thrombocytosis ([place]) 08/20/2016     Plan:  1. Supervision of other normal pregnancy, antepartum --Anticipatory guidance about next visits/weeks of pregnancy given. --Reviewed safety, visitor policy, reassurance about COVID-19 for pregnancy at this time. Discussed possible changes to visits, including televisits, that may occur due to COVID-19.  The office remains open if pt needs to be seen and [facility] is open 24 hours/day for OB emergencies.    - Obstetric Panel, Including HIV - Culture, OB Urine - [facility] AMB BABYSCRIPTS SCHEDULE OPTIMIZATION - Genetic Screening - Cervicovaginal ancillary only( [place]) - Cytology - PAP( ) -  [place] MFM OB COMP + 14 WK; Future   2. Nausea and vomiting during pregnancy prior to [redacted] weeks gestation --Nausea daily with occasional vomiting.  Discussed dietary changes for n/v. - Doxylamine-Pyridoxine (DICLEGIS) 10-10 MG TBEC; Take 2 tabs at bedtime. If needed, add another tab in the morning. If needed, add another tab in the afternoon, up to 4 tabs/day.  Dispense: 100 tablet; Refill: 5  3. Tobacco smoking affecting pregnancy in first trimester --Motivated to quit. Reduced to 6 cigarettes per day.  Discussed with pt, will try lowest dose patch to aid in quitting. - nicotine (NICODERM CQ - DOSED IN MG/24 HR) 7 mg/24hr patch; Place 1 patch (7 mg total) onto the skin daily.  Dispense: 28 patch; Refill: 1  Initial labs drawn. Continue prenatal vitamins. Discussed and offered genetic screening options, including Quad screen/AFP, NIPS testing, and option to decline testing. Benefits/risks/alternatives reviewed. Pt aware that anatomy US is form of genetic screening with lower accuracy in detecting trisomies than blood work.  Pt chooses/declines genetic screening today. NIPS: ordered. Ultrasound discussed; fetal anatomic survey: ordered. Problem list reviewed and updated. The nature of [facility] with multiple MDs and other Advanced Practice Providers was explained to patient; also emphasized that residents, students are part of our team. Routine obstetric precautions reviewed. Return in about 4 weeks (around 10/04/2018).   Fatima Blank, CNM 09/06/18 2:32 PM

## 2018-09-07 LAB — OBSTETRIC PANEL, INCLUDING HIV
Basophils Absolute: 0 10*3/uL (ref 0.0–0.2)
Basos: 0 %
EOS (ABSOLUTE): 0.1 10*3/uL (ref 0.0–0.4)
Eos: 2 %
HIV Screen 4th Generation wRfx: NONREACTIVE
Hematocrit: 26.5 % — ABNORMAL LOW (ref 34.0–46.6)
Hemoglobin: 7.7 g/dL — ABNORMAL LOW (ref 11.1–15.9)
Hepatitis B Surface Ag: NEGATIVE
Immature Grans (Abs): 0.1 10*3/uL (ref 0.0–0.1)
Immature Granulocytes: 1 %
Lymphocytes Absolute: 2.1 10*3/uL (ref 0.7–3.1)
Lymphs: 25 %
MCH: 19 pg — ABNORMAL LOW (ref 26.6–33.0)
MCHC: 29.1 g/dL — ABNORMAL LOW (ref 31.5–35.7)
MCV: 65 fL — ABNORMAL LOW (ref 79–97)
Monocytes Absolute: 0.5 10*3/uL (ref 0.1–0.9)
Monocytes: 6 %
Neutrophils Absolute: 5.4 10*3/uL (ref 1.4–7.0)
Neutrophils: 66 %
Platelets: 399 10*3/uL (ref 150–450)
RBC: 4.06 x10E6/uL (ref 3.77–5.28)
RDW: 21.3 % — ABNORMAL HIGH (ref 11.7–15.4)
RPR Ser Ql: NONREACTIVE
Rh Factor: POSITIVE
Rubella Antibodies, IGG: 7.03 index (ref >0.99)
WBC: 8.2 10*3/uL (ref 3.4–10.8)

## 2018-09-07 LAB — CERVICOVAGINAL ANCILLARY ONLY
Bacterial vaginitis: POSITIVE — AB
Candida vaginitis: POSITIVE — AB
Chlamydia: NEGATIVE
Neisseria Gonorrhea: NEGATIVE
Trichomonas: NEGATIVE

## 2018-09-07 LAB — AB SCR+ANTIBODY ID: Antibody Screen: POSITIVE — AB

## 2018-09-08 LAB — CYTOLOGY - PAP
Diagnosis: NEGATIVE
Diagnosis: REACTIVE

## 2018-09-09 LAB — URINE CULTURE, OB REFLEX

## 2018-09-09 LAB — CULTURE, OB URINE

## 2018-09-10 ENCOUNTER — Encounter: Payer: Self-pay | Admitting: Advanced Practice Midwife

## 2018-09-10 DIAGNOSIS — R7689 Other specified abnormal immunological findings in serum: Secondary | ICD-10-CM | POA: Insufficient documentation

## 2018-09-10 DIAGNOSIS — R768 Other specified abnormal immunological findings in serum: Secondary | ICD-10-CM | POA: Insufficient documentation

## 2018-09-12 ENCOUNTER — Other Ambulatory Visit: Payer: Self-pay | Admitting: Advanced Practice Midwife

## 2018-09-12 ENCOUNTER — Telehealth: Payer: Self-pay | Admitting: Advanced Practice Midwife

## 2018-09-12 DIAGNOSIS — Z3402 Encounter for supervision of normal first pregnancy, second trimester: Secondary | ICD-10-CM

## 2018-09-12 NOTE — Telephone Encounter (Signed)
-----   Message from Elvera Maria, CNM sent at 09/10/2018  1:24 AM EDT ----- Regarding: antibody screen positive Multiple red blood cell antibodies.  Does pt need referral?

## 2018-09-12 NOTE — Progress Notes (Signed)
Pt antibody screen abnormal with multiple antibodies present.  Orders placed to repeat antibody screen per Dr Elly Modena.

## 2018-09-13 ENCOUNTER — Other Ambulatory Visit: Payer: Self-pay | Admitting: Advanced Practice Midwife

## 2018-09-13 MED ORDER — INTEGRA 62.5-62.5-40-3 MG PO CAPS: 1.0000 | ORAL_CAPSULE | Freq: Every day | ORAL | 2 refills | Status: DC

## 2018-09-14 ENCOUNTER — Encounter: Payer: Self-pay | Admitting: Advanced Practice Midwife

## 2018-09-15 ENCOUNTER — Telehealth: Payer: Self-pay | Admitting: *Deleted

## 2018-09-15 NOTE — Telephone Encounter (Signed)
Pt called to office for results.  Attempt to contact pt.  No answer, LM on VM to call back.

## 2018-10-05 ENCOUNTER — Encounter: Payer: Managed Care, Other (non HMO) | Admitting: Advanced Practice Midwife

## 2018-10-07 ENCOUNTER — Ambulatory Visit (INDEPENDENT_AMBULATORY_CARE_PROVIDER_SITE_OTHER): Payer: Managed Care, Other (non HMO) | Admitting: Advanced Practice Midwife

## 2018-10-07 ENCOUNTER — Encounter: Payer: Self-pay | Admitting: Advanced Practice Midwife

## 2018-10-07 ENCOUNTER — Other Ambulatory Visit: Payer: Self-pay

## 2018-10-07 VITALS — BP 112/72 | HR 80 | Wt 166.8 lb

## 2018-10-07 DIAGNOSIS — B373 Candidiasis of vulva and vagina: Secondary | ICD-10-CM

## 2018-10-07 DIAGNOSIS — O23592 Infection of other part of genital tract in pregnancy, second trimester: Secondary | ICD-10-CM

## 2018-10-07 DIAGNOSIS — O99012 Anemia complicating pregnancy, second trimester: Secondary | ICD-10-CM

## 2018-10-07 DIAGNOSIS — B9689 Other specified bacterial agents as the cause of diseases classified elsewhere: Secondary | ICD-10-CM

## 2018-10-07 DIAGNOSIS — Z3A19 19 weeks gestation of pregnancy: Secondary | ICD-10-CM

## 2018-10-07 DIAGNOSIS — Z363 Encounter for antenatal screening for malformations: Secondary | ICD-10-CM

## 2018-10-07 DIAGNOSIS — O289 Unspecified abnormal findings on antenatal screening of mother: Secondary | ICD-10-CM

## 2018-10-07 DIAGNOSIS — Z3480 Encounter for supervision of other normal pregnancy, unspecified trimester: Secondary | ICD-10-CM

## 2018-10-07 DIAGNOSIS — B3731 Acute candidiasis of vulva and vagina: Secondary | ICD-10-CM

## 2018-10-07 DIAGNOSIS — N760 Acute vaginitis: Secondary | ICD-10-CM

## 2018-10-07 DIAGNOSIS — Z3402 Encounter for supervision of normal first pregnancy, second trimester: Secondary | ICD-10-CM

## 2018-10-07 DIAGNOSIS — D500 Iron deficiency anemia secondary to blood loss (chronic): Secondary | ICD-10-CM

## 2018-10-07 MED ORDER — TERCONAZOLE 0.4 % VA CREA: 1.0000 | TOPICAL_CREAM | Freq: Every day | VAGINAL | 1 refills | Status: DC

## 2018-10-07 MED ORDER — METRONIDAZOLE 500 MG PO TABS: 500.0000 mg | ORAL_TABLET | Freq: Two times a day (BID) | ORAL | 0 refills | Status: DC

## 2018-10-07 NOTE — Progress Notes (Signed)
Pt is here for ROB. [redacted]w[redacted]d.

## 2018-10-07 NOTE — Progress Notes (Signed)
   PRENATAL VISIT NOTE  Subjective:  Katie Powell is a 28 y.o. G2P0010 at [redacted]w[redacted]d being seen today for ongoing prenatal care.  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  Patient reports no complaints.  Contractions: Not present. Vag. Bleeding: None.   . Denies leaking of fluid.   The following portions of the patient's history were reviewed and updated as appropriate: allergies, current medications, past family history, past medical history, past social history, past surgical history and problem list.   Objective:   Vitals:   10/07/18 0947  BP: 112/72  Pulse: 80  Weight: 166 lb 12.8 oz (75.7 kg)    Fetal Status: Fetal Heart Rate (bpm): 150 Fundal Height: 15 cm       General:  Alert, oriented and cooperative. Patient is in no acute distress.  Skin: Skin is warm and dry. No rash noted.   Cardiovascular: Normal heart rate noted  Respiratory: Normal respiratory effort, no problems with respiration noted  Abdomen: Soft, gravid, appropriate for gestational age.  Pain/Pressure: Absent     Pelvic: Cervical exam deferred        Extremities: Normal range of motion.  Edema: None  Mental Status: Normal mood and affect. Normal behavior. Normal judgment and thought content.   Assessment and Plan:  Pregnancy: G2P0010 at [redacted]w[redacted]d 1. Antenatal screening for malformation using ultrasonics  - [place] MFM OB COMP + 14 WK; Future  2. [redacted] weeks gestation of pregnancy  - [place] MFM OB COMP + 67 WK; Future  3. Encounter for supervision of normal first pregnancy in second trimester  - Antibody screen - AFP, Serum, Open Spina Bifida  4. Supervision of high-risk pregnancy, antepartum  5.  Severe anemia  in pregnancy  - Referral to hematology.  6.  Antibody screen positive.  Unable to type antibody at last check.  Patient reports history of blood transfusion.    - Antibody screen  Preterm labor symptoms and general obstetric precautions including but not limited to vaginal bleeding, contractions, leaking of fluid and fetal movement were reviewed in detail with the patient. Please refer to After Visit Summary for other counseling recommendations.   No follow-ups on file.  Future Appointments  Date Time Provider [place]  11/04/2018  9:30 AM WOC-WOCA LAB WOC-WOCA WOC  11/04/2018 10:15 AM WH-MFC [place] 4 WH-MFCUS MFC-US    Nylee Barbuto, [place]

## 2018-10-08 LAB — CBC
Hematocrit: 26.8 % — ABNORMAL LOW (ref 34.0–46.6)
Hemoglobin: 7.9 g/dL — ABNORMAL LOW (ref 11.1–15.9)
MCH: 20.2 pg — ABNORMAL LOW (ref 26.6–33.0)
MCHC: 29.5 g/dL — ABNORMAL LOW (ref 31.5–35.7)
MCV: 68 fL — ABNORMAL LOW (ref 79–97)
Platelets: 344 10*3/uL (ref 150–450)
RBC: 3.92 x10E6/uL (ref 3.77–5.28)
RDW: 21.7 % — ABNORMAL HIGH (ref 11.7–15.4)
WBC: 6 10*3/uL (ref 3.4–10.8)

## 2018-10-11 LAB — AB SCR+ANTIBODY ID: Antibody Screen: POSITIVE — AB

## 2018-10-11 LAB — AFP, SERUM, OPEN SPINA BIFIDA
AFP MoM: 1.05
AFP Value: 31.2 ng/mL
Gest. Age on Collection Date: 15 weeks
Maternal Age At EDD: 25.9 yr
OSBR Risk 1 IN: 10000
Test Results:: NEGATIVE
Weight: 162 [lb_av]

## 2018-10-11 LAB — ANTIBODY SCREEN

## 2018-10-18 ENCOUNTER — Telehealth: Payer: Self-pay

## 2018-10-18 NOTE — Telephone Encounter (Signed)
Returned call and advised that we can provide a work letter for pregnancy lifting restrictions, and will leave at front desk. Pt agreed.

## 2018-10-24 ENCOUNTER — Telehealth: Payer: Self-pay | Admitting: Hematology

## 2018-10-24 NOTE — Telephone Encounter (Signed)
Received a new hem appt from [place], CNM for anemia in pregnancy. Pt has been cld and scheduled to see Dr. Irene Limbo on 6/29 at 10am. Pt agreed to the appt date and time. Location to our facility has been given to the pt.

## 2018-11-04 ENCOUNTER — Other Ambulatory Visit: Payer: Self-pay

## 2018-11-04 ENCOUNTER — Other Ambulatory Visit: Payer: Self-pay | Admitting: Advanced Practice Midwife

## 2018-11-04 ENCOUNTER — Other Ambulatory Visit: Payer: Managed Care, Other (non HMO)

## 2018-11-04 ENCOUNTER — Telehealth: Payer: Self-pay

## 2018-11-04 ENCOUNTER — Ambulatory Visit (HOSPITAL_COMMUNITY)
Admission: RE | Admit: 2018-11-04 | Discharge: 2018-11-04 | Disposition: A | Discharge status: Home / Self Care | Payer: Managed Care, Other (non HMO) | Source: Ambulatory Visit | Attending: Obstetrics and Gynecology | Admitting: Obstetrics and Gynecology

## 2018-11-04 DIAGNOSIS — O358XX0 Maternal care for other (suspected) fetal abnormality and damage, not applicable or unspecified: Secondary | ICD-10-CM

## 2018-11-04 DIAGNOSIS — Z363 Encounter for antenatal screening for malformations: Secondary | ICD-10-CM | POA: Diagnosis not present

## 2018-11-04 DIAGNOSIS — Z3480 Encounter for supervision of other normal pregnancy, unspecified trimester: Secondary | ICD-10-CM

## 2018-11-04 DIAGNOSIS — O99332 Smoking (tobacco) complicating pregnancy, second trimester: Secondary | ICD-10-CM | POA: Diagnosis not present

## 2018-11-04 DIAGNOSIS — Z3A19 19 weeks gestation of pregnancy: Secondary | ICD-10-CM

## 2018-11-04 NOTE — Telephone Encounter (Signed)
Called and left voicemail regarding pre-screening questions for appt on 6/29  

## 2018-11-07 ENCOUNTER — Other Ambulatory Visit: Payer: Self-pay

## 2018-11-07 ENCOUNTER — Inpatient Hospital Stay: Payer: Managed Care, Other (non HMO) | Attending: Hematology | Admitting: Hematology

## 2018-11-07 ENCOUNTER — Inpatient Hospital Stay: Payer: Managed Care, Other (non HMO)

## 2018-11-07 ENCOUNTER — Telehealth: Payer: Self-pay | Admitting: Hematology

## 2018-11-07 VITALS — BP 122/75 | HR 89 | Temp 98.3°F | Resp 18 | Ht 66.0 in | Wt 175.5 lb

## 2018-11-07 DIAGNOSIS — R42 Dizziness and giddiness: Secondary | ICD-10-CM | POA: Diagnosis not present

## 2018-11-07 DIAGNOSIS — O99012 Anemia complicating pregnancy, second trimester: Secondary | ICD-10-CM

## 2018-11-07 DIAGNOSIS — F419 Anxiety disorder, unspecified: Secondary | ICD-10-CM | POA: Diagnosis not present

## 2018-11-07 DIAGNOSIS — K5900 Constipation, unspecified: Secondary | ICD-10-CM | POA: Diagnosis not present

## 2018-11-07 DIAGNOSIS — D500 Iron deficiency anemia secondary to blood loss (chronic): Secondary | ICD-10-CM

## 2018-11-07 DIAGNOSIS — N920 Excessive and frequent menstruation with regular cycle: Secondary | ICD-10-CM | POA: Insufficient documentation

## 2018-11-07 DIAGNOSIS — Z79899 Other long term (current) drug therapy: Secondary | ICD-10-CM | POA: Insufficient documentation

## 2018-11-07 DIAGNOSIS — F17210 Nicotine dependence, cigarettes, uncomplicated: Secondary | ICD-10-CM | POA: Diagnosis not present

## 2018-11-07 DIAGNOSIS — D509 Iron deficiency anemia, unspecified: Secondary | ICD-10-CM | POA: Insufficient documentation

## 2018-11-07 DIAGNOSIS — Z3A19 19 weeks gestation of pregnancy: Secondary | ICD-10-CM | POA: Insufficient documentation

## 2018-11-07 DIAGNOSIS — R5383 Other fatigue: Secondary | ICD-10-CM

## 2018-11-07 DIAGNOSIS — R768 Other specified abnormal immunological findings in serum: Secondary | ICD-10-CM

## 2018-11-07 LAB — CBC WITH DIFFERENTIAL/PLATELET
Abs Immature Granulocytes: 0.02 10*3/uL (ref 0.00–0.07)
Basophils Absolute: 0 10*3/uL (ref 0.0–0.1)
Basophils Relative: 0 %
Eosinophils Absolute: 0.2 10*3/uL (ref 0.0–0.5)
Eosinophils Relative: 3 %
HCT: 26.9 % — ABNORMAL LOW (ref 36.0–46.0)
Hemoglobin: 8.1 g/dL — ABNORMAL LOW (ref 12.0–15.0)
Immature Granulocytes: 0 %
Lymphocytes Relative: 23 %
Lymphs Abs: 1.5 10*3/uL (ref 0.7–4.0)
MCH: 21.1 pg — ABNORMAL LOW (ref 26.0–34.0)
MCHC: 30.1 g/dL (ref 30.0–36.0)
MCV: 70.1 fL — ABNORMAL LOW (ref 80.0–100.0)
Monocytes Absolute: 0.6 10*3/uL (ref 0.1–1.0)
Monocytes Relative: 10 %
Neutro Abs: 4.2 10*3/uL (ref 1.7–7.7)
Neutrophils Relative %: 64 %
Platelets: 268 10*3/uL (ref 150–400)
RBC: 3.84 MIL/uL — ABNORMAL LOW (ref 3.87–5.11)
RDW: 21.4 % — ABNORMAL HIGH (ref 11.5–15.5)
WBC: 6.4 10*3/uL (ref 4.0–10.5)
nRBC: 0 % (ref 0.0–0.2)

## 2018-11-07 LAB — CMP (CANCER CENTER ONLY)
ALT: 128 U/L — ABNORMAL HIGH (ref 0–44)
AST: 95 U/L — ABNORMAL HIGH (ref 15–41)
Albumin: 3 g/dL — ABNORMAL LOW (ref 3.5–5.0)
Alkaline Phosphatase: 50 U/L (ref 38–126)
Anion gap: 7 (ref 5–15)
BUN: 9 mg/dL (ref 6–20)
CO2: 22 mmol/L (ref 22–32)
Calcium: 8.3 mg/dL — ABNORMAL LOW (ref 8.9–10.3)
Chloride: 108 mmol/L (ref 98–111)
Creatinine: 0.61 mg/dL (ref 0.44–1.00)
GFR, Est AFR Am: >60 mL/min (ref >60)
GFR, Estimated: >60 mL/min (ref >60)
Glucose, Bld: 80 mg/dL (ref 70–99)
Potassium: 3.9 mmol/L (ref 3.5–5.1)
Sodium: 137 mmol/L (ref 135–145)
Total Bilirubin: 0.4 mg/dL (ref 0.3–1.2)
Total Protein: 7 g/dL (ref 6.5–8.1)

## 2018-11-07 LAB — IRON AND TIBC
Iron: 17 ug/dL — ABNORMAL LOW (ref 41–142)
Saturation Ratios: 4 % — ABNORMAL LOW (ref 21–57)
TIBC: 425 ug/dL (ref 236–444)
UIBC: 408 ug/dL — ABNORMAL HIGH (ref 120–384)

## 2018-11-07 LAB — DIRECT ANTIGLOBULIN TEST (NOT AT ARMC)
DAT, IgG: NEGATIVE
DAT, complement: POSITIVE

## 2018-11-07 LAB — LACTATE DEHYDROGENASE: LDH: 166 U/L (ref 98–192)

## 2018-11-07 LAB — VITAMIN B12: Vitamin B-12: 374 pg/mL (ref 180–914)

## 2018-11-07 LAB — FERRITIN: Ferritin: 6 ng/mL — ABNORMAL LOW (ref 11–307)

## 2018-11-07 MED ORDER — DOCUSATE SODIUM 100 MG PO CAPS: 100.0000 mg | ORAL_CAPSULE | Freq: Two times a day (BID) | ORAL | 0 refills | Status: DC | PRN

## 2018-11-07 MED ORDER — POLYSACCHARIDE IRON COMPLEX 150 MG PO CAPS: 150.0000 mg | ORAL_CAPSULE | Freq: Two times a day (BID) | ORAL | 5 refills | Status: DC

## 2018-11-07 NOTE — Telephone Encounter (Signed)
Scheduled appt per 6/29 los. Spoke with patient and she is aware of appt date and time.

## 2018-11-07 NOTE — Progress Notes (Signed)
HEMATOLOGY/ONCOLOGY CLINIC NOTE  Date of Service: 11/07/2018  Patient Care Team: Patient, No Pcp Per as PCP - General (General Practice)  CHIEF COMPLAINTS/PURPOSE OF CONSULTATION:  Anemia in Pregnancy  HISTORY OF PRESENTING ILLNESS:   Katie Powell is a wonderful 28 y.o. female who has been referred to [place] by [place], CNM for evaluation and management of Anemia in pregnancy. Katie Powell reports that she is doing well overall. She is currently at [redacted] weeks gestation.  Katie Powell reports that she was first told she was anemic about 4 years ago when her HGB was at 6. She was given a blood transfusion and IV iron replacement, and was give PO Iron replacement which caused constipation. She subsequently stopped taking PO iron after about 3 months.   Recently, Katie Powell has been taking Integra once a day for Katie past 2 months, and Prenatal vitamins as well. She had unidentified antibodies on 10/07/18 antibody screen and is being followed by high risk pregnancy.  Katie Powell notes that she has been very fatigued recently. She endorses crushed ice and corn starch cravings. She also endorses light headedness and dizziness occasionally, especially when standing after lying down. She is gaining weight appropriately. She feels that she has been feeling less fatigued, light headed and dizzy since beginning PO iron replacement two months ago. She notes that she is constipated and is having bowel movements every 3 days.  Katie Powell notes that her periods previously lasted 7 days and were very heavy. She attempted PO contraceptives which were not able to control her periods. She has also tried depo shots which caused worsened bleeding. She denies being otherwise evaluated. Katie Powell denies nose bleeds or gum bleeds at baseline. She has had some recent nose bleeds which occur when she blows her nose.  Katie Powell had one previous pregnancy which resulted in a miscarriage. Katie Powell notes that she was adopted and is not familiar with  her biological family medical history.   Katie Powell notes that she was smoking up to 8 cigarettes a day prior to pregnancy, and now she is on a patch.  Most recent lab results (10/07/18) of CBC is as follows: all values are WNL except for HGB at 7.9, HCT at 26.8%, MCV at 68, MCH at 20.2, MCHC at 29.5, RDW at 21.7.  On review of systems, Powell reports history of heavy periods, recent fatigue, occasional light headedness and dizziness, recent nose bleeds, constipation, appropriate weight gain, and denies gum bleeds, abdominal pains, and any other symptoms.   On PMHx Katie Powell reports anemia, finger surgery, eye surgery. On Social Hx Katie Powell reports previously smoking 8 cigarettes per day, but quit when she became pregnant. Works at a [employer]. On Family Hx Katie Powell reports that she is adopted and is unfamiliar with biological family history.   MEDICAL HISTORY:  Past Medical History:  Diagnosis Date  . Anemia   . Anxiety     SURGICAL HISTORY: Past Surgical History:  Procedure Laterality Date  . EYE SURGERY    . FINGER SURGERY      SOCIAL HISTORY: Social History   Socioeconomic History  . Marital status: Single    Spouse name: Not on file  . Number of children: Not on file  . Years of education: Not on file  . Highest education level: Not on file  Occupational History  . Not on file  Social Needs  . Financial resource strain: Not on file  .  Food insecurity    Worry: Not on file    Inability: Not on file  . Transportation needs    Medical: Not on file    Non-medical: Not on file  Tobacco Use  . Smoking status: Current Every Day Smoker    Packs/day: 0.50    Types: Cigarettes  . Smokeless tobacco: Never Used  Substance and Sexual Activity  . Alcohol use: Yes    Comment: occasionally  . Drug use: No  . Sexual activity: Yes    Birth control/protection: Injection  Lifestyle  . Physical activity    Days per week: Not on file    Minutes per session: Not on file  .  Stress: Not on file  Relationships  . Social Herbalist on phone: Not on file    Gets together: Not on file    Attends religious service: Not on file    Active member of club or organization: Not on file    Attends meetings of clubs or organizations: Not on file    Relationship status: Not on file  . Intimate partner violence    Fear of current or ex partner: Not on file    Emotionally abused: Not on file    Physically abused: Not on file    Forced sexual activity: Not on file  Other Topics Concern  . Not on file  Social History Narrative  . Not on file    FAMILY HISTORY: No family history on file.  ALLERGIES:  has No Known Allergies.  MEDICATIONS:  Current Outpatient Medications  Medication Sig Dispense Refill  . docusate sodium (COLACE) 100 MG capsule Take 1 capsule (100 mg total) by mouth 2 (two) times daily as needed for mild constipation or moderate constipation. 60 capsule 0  . Doxylamine-Pyridoxine (DICLEGIS) 10-10 MG TBEC Take 2 tabs at bedtime. If needed, add another tab in Katie morning. If needed, add another tab in Katie afternoon, up to 4 tabs/day. (Patient not taking: Reported on 10/07/2018) 100 tablet 5  . Fe Fum-FePoly-Vit C-Vit B3 (INTEGRA) 62.5-62.5-40-3 MG CAPS Take 1 capsule by mouth daily. 30 capsule 2  . folic acid (FOLVITE) 1 MG tablet Take 1 tablet (1 mg total) by mouth daily. (Patient not taking: Reported on 10/07/2018)    . iron polysaccharides (NIFEREX) 150 MG capsule Take 1 capsule (150 mg total) by mouth 2 (two) times daily. 60 capsule 5  . metroNIDAZOLE (FLAGYL) 500 MG tablet Take 1 tablet (500 mg total) by mouth 2 (two) times daily. 14 tablet 0  . Prenatal Vit w/Fe-Methylfol-FA (PNV PO) Take by mouth.    . terconazole (TERAZOL 7) 0.4 % vaginal cream Place 1 applicator vaginally at bedtime. 45 g 1   No current facility-administered medications for this visit.     REVIEW OF SYSTEMS:    10 Point review of Systems was done is negative except as  noted above.  PHYSICAL EXAMINATION:  . Vitals:   11/07/18 1011  BP: 122/75  Pulse: 89  Resp: 18  Temp: 98.3 F (36.8 C)  SpO2: 100%   Filed Weights   11/07/18 1011  Weight: 175 lb 8 oz (79.6 kg)   .Body mass index is 28.33 kg/m.  GENERAL:alert, in no acute distress and comfortable SKIN: no acute rashes, no significant lesions EYES: conjunctiva are pink and non-injected, sclera anicteric OROPHARYNX: MMM, no exudates, no oropharyngeal erythema or ulceration NECK: supple, no JVD LYMPH:  no palpable lymphadenopathy in Katie cervical, axillary or inguinal regions LUNGS:  clear to auscultation b/l with normal respiratory effort HEART: regular rate & rhythm ABDOMEN:  normoactive bowel sounds , non tender, not distended. No palpable hepatosplenomegaly.  Extremity: no pedal edema PSYCH: alert & oriented x 3 with fluent speech NEURO: no focal motor/sensory deficits  LABORATORY DATA:  I have reviewed Katie data as listed  . CBC Latest Ref Rng & Units 11/07/2018 10/07/2018 09/06/2018  WBC 4.0 - 10.5 K/uL 6.4 6.0 8.2  Hemoglobin 12.0 - 15.0 g/dL 8.1(L) 7.9(L) 7.7(L)  Hematocrit 36.0 - 46.0 % 26.9(L) 26.8(L) 26.5(L)  Platelets 150 - 400 K/uL 268 344 399    . CMP Latest Ref Rng & Units 11/07/2018 11/03/2017 05/18/2017  Glucose 70 - 99 mg/dL 80 89 75  BUN 6 - 20 mg/dL 9 14 13   Creatinine 0.44 - 1.00 mg/dL 0.61 0.65 0.58  Sodium 135 - 145 mmol/L 137 137 136  Potassium 3.5 - 5.1 mmol/L 3.9 3.3(L) 3.4(L)  Chloride 98 - 111 mmol/L 108 107 106  CO2 22 - 32 mmol/L 22 24 25   Calcium 8.9 - 10.3 mg/dL 8.3(L) 8.7(L) 8.7(L)  Total Protein 6.5 - 8.1 g/dL 7.0 - -  Total Bilirubin 0.3 - 1.2 mg/dL 0.4 - -  Alkaline Phos 38 - 126 U/L 50 - -  AST 15 - 41 U/L 95(H) - -  ALT 0 - 44 U/L 128(H) - -     RADIOGRAPHIC STUDIES: I have personally reviewed Katie radiological images as listed and agreed with Katie findings in Katie report.  ASSESSMENT & PLAN:   28 y.o. female with  1. Iron Deficiency Anemia  in pregnancy Secondary to menorrhagia  PLAN: -Discussed patient's most recent labs from 10/07/18, HGB at 7.9 with MCV at 68. -Reviewed Katie 10/07/18 Ab Scr+ Antibody ID which was positive for unidentified antibodies reacting with red cells. Would recommend avoiding blood transfusions if possible. -Last available 08/20/16 Ferritin was at 1 -Powell denies current light headedness or dizziness. Endorses improving fatigue alongside PO Iron replacement. -After discussion, will be switching her to PO 150mg  Iron Polysaccharide po daily + 1 tab of Integra daily for 4-6 weeks, followed by repeat blood tests, and then consideration of IV Iron replacement. If this replacement is not tolerated, she will let me know prior to our next appointment and I will set her up for IV replacement at that time. -Would recommend PO iron replacement through pregnancy and breast feeding as well -Recommend Miralax daily for constipation, backing off if diarrhea develops. -Recommend sterile saline spray otc once a day, and light vaseline application in nares -Will order labs today -Will see Katie Powell back in 6 weeks   Labs today RTC with Dr Irene Limbo in 6 weeks with labs   All of Katie patients questions were answered with apparent satisfaction. Katie patient knows to call Katie clinic with any problems, questions or concerns.  Katie total time spent in Katie appt was 35 minutes and more than 50% was on counseling and direct patient cares.    Sullivan Lone MD MS AAHIVMS [facility] Hematology/Oncology Physician [facility]  (Office):       [phone] (Work cell):  [phone] (Fax):           [phone]  11/07/2018 10:58 AM  I, Baldwin [place], am acting as a scribe for Dr. Sullivan Lone.   .I have reviewed Katie above documentation for accuracy and completeness, and I agree with Katie above. Brunetta Genera MD    ADDENDUM  . CBC  Component Value Date/Time   WBC 6.4 11/07/2018 1058   RBC 3.84 (L) 11/07/2018 1058    HGB 8.1 (L) 11/07/2018 1058   HGB 7.9 (L) 10/07/2018 1031   HCT 26.9 (L) 11/07/2018 1058   HCT 26.8 (L) 10/07/2018 1031   PLT 268 11/07/2018 1058   PLT 344 10/07/2018 1031   MCV 70.1 (L) 11/07/2018 1058   MCV 68 (L) 10/07/2018 1031   MCH 21.1 (L) 11/07/2018 1058   MCHC 30.1 11/07/2018 1058   RDW 21.4 (H) 11/07/2018 1058   RDW 21.7 (H) 10/07/2018 1031   LYMPHSABS 1.5 11/07/2018 1058   LYMPHSABS 2.1 09/06/2018 1427   MONOABS 0.6 11/07/2018 1058   EOSABS 0.2 11/07/2018 1058   EOSABS 0.1 09/06/2018 1427   BASOSABS 0.0 11/07/2018 1058   BASOSABS 0.0 09/06/2018 1427    . Lab Results  Component Value Date   IRON 17 (L) 11/07/2018   TIBC 425 11/07/2018   IRONPCTSAT 4 (L) 11/07/2018   (Iron and TIBC)  Lab Results  Component Value Date   FERRITIN 6 (L) 11/07/2018    . CMP Latest Ref Rng & Units 11/07/2018 11/03/2017 05/18/2017  Glucose 70 - 99 mg/dL 80 89 75  BUN 6 - 20 mg/dL 9 14 13   Creatinine 0.44 - 1.00 mg/dL 0.61 0.65 0.58  Sodium 135 - 145 mmol/L 137 137 136  Potassium 3.5 - 5.1 mmol/L 3.9 3.3(L) 3.4(L)  Chloride 98 - 111 mmol/L 108 107 106  CO2 22 - 32 mmol/L 22 24 25   Calcium 8.9 - 10.3 mg/dL 8.3(L) 8.7(L) 8.7(L)  Total Protein 6.5 - 8.1 g/dL 7.0 - -  Total Bilirubin 0.3 - 1.2 mg/dL 0.4 - -  Alkaline Phos 38 - 126 U/L 50 - -  AST 15 - 41 U/L 95(H) - -  ALT 0 - 44 U/L 128(H) - -     A 1. Significant Iron deficiency --Ferritin up from 1 to 6. -PO iron higher dose trial  2. Abnorrmal AST and ALT  -f/u with Ob-Gyn for further evaluation and management.  Brunetta Genera MD

## 2018-11-07 NOTE — Progress Notes (Signed)
Orders entered

## 2018-11-08 LAB — AB SCR+ANTIBODY ID: Antibody Screen: POSITIVE — AB

## 2018-11-08 LAB — TYPE AND SCREEN
ABO/RH(D): O POS
Antibody Screen: POSITIVE
DAT, IgG: NEGATIVE

## 2018-11-08 LAB — ANTIBODY SCREEN

## 2018-11-09 LAB — AFP, SERUM, OPEN SPINA BIFIDA
AFP MoM: 0.64
AFP Value: 31.3 ng/mL
Gest. Age on Collection Date: 19 weeks
Maternal Age At EDD: 25.9 yr
OSBR Risk 1 IN: 10000
Test Results:: NEGATIVE
Weight: 173 [lb_av]

## 2018-11-17 ENCOUNTER — Telehealth: Payer: Self-pay | Admitting: *Deleted

## 2018-11-17 NOTE — Telephone Encounter (Signed)
Contacted patient regarding test results per Dr. Grier Mitts directions: Please let patient know her Liver function tests were noted to be abnormal. She need to f/u with her PCP and ObGyn doctor for further evaluation and management.  Avoid hepatotoxic medications.  Patient verbalized understanding of information.

## 2018-11-18 ENCOUNTER — Telehealth (INDEPENDENT_AMBULATORY_CARE_PROVIDER_SITE_OTHER): Payer: Managed Care, Other (non HMO) | Admitting: Certified Nurse Midwife

## 2018-11-18 ENCOUNTER — Encounter: Payer: Self-pay | Admitting: Certified Nurse Midwife

## 2018-11-18 VITALS — BP 114/73 | HR 90

## 2018-11-18 DIAGNOSIS — D500 Iron deficiency anemia secondary to blood loss (chronic): Secondary | ICD-10-CM

## 2018-11-18 DIAGNOSIS — O26892 Other specified pregnancy related conditions, second trimester: Secondary | ICD-10-CM

## 2018-11-18 DIAGNOSIS — Z3480 Encounter for supervision of other normal pregnancy, unspecified trimester: Secondary | ICD-10-CM

## 2018-11-18 DIAGNOSIS — R945 Abnormal results of liver function studies: Secondary | ICD-10-CM

## 2018-11-18 DIAGNOSIS — O99012 Anemia complicating pregnancy, second trimester: Secondary | ICD-10-CM

## 2018-11-18 DIAGNOSIS — R768 Other specified abnormal immunological findings in serum: Secondary | ICD-10-CM

## 2018-11-18 DIAGNOSIS — Z3A21 21 weeks gestation of pregnancy: Secondary | ICD-10-CM

## 2018-11-18 DIAGNOSIS — R7989 Other specified abnormal findings of blood chemistry: Secondary | ICD-10-CM

## 2018-11-18 MED ORDER — COMFORT FIT MATERNITY SUPP LG MISC: 1.0000 [IU] | Freq: Every day | 0 refills | Status: DC

## 2018-11-18 NOTE — Progress Notes (Signed)
S/w pt for mychart visit, pt reports fetal movement, denies pain. Pt wants to discuss labs from 11-07-18.

## 2018-11-18 NOTE — Progress Notes (Signed)
[place] VIRTUAL VIDEO VISIT ENCOUNTER NOTE  Provider location: [facility] at [place]   I connected with Katie Powell on 11/18/18 at  9:07 AM EDT by MyChart Video Encounter at home and verified that I am speaking with the correct person using two identifiers.   I discussed the limitations, risks, security and privacy concerns of performing an evaluation and management service virtually and the availability of in person appointments. I also discussed with the patient that there may be a patient responsible charge related to this service. The patient expressed understanding and agreed to proceed. Subjective:  Katie Powell is a 28 y.o. G2P0010 at 97w0dbeing seen today for ongoing prenatal care.  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  Patient reports no complaints.  Contractions: Not present. Vag. Bleeding: None.  Movement: Present. Denies any leaking of fluid.   The following portions of the patient's history were reviewed and updated as appropriate: allergies, current medications, past family history, past medical history, past social history, past surgical history and problem list.   Objective:   Vitals:   11/18/18 0855  BP: 114/73  Pulse: 90    Fetal Status:     Movement: Present     General:  Alert, oriented and cooperative. Patient is in no acute distress.  Respiratory: Normal respiratory effort, no problems with respiration noted  Mental Status: Normal mood and affect. Normal behavior. Normal judgment and thought content.  Rest of physical exam deferred due to type of encounter  Imaging: UK[place]Mfm Ob Detail +14 Wk  Result  Date: 11/04/2018 ----------------------------------------------------------------------  OBSTETRICS REPORT                       (Signed Final 11/04/2018 03:39 pm) ---------------------------------------------------------------------- Patient Info  ID #:       [id number]                         D.O.B.:  104/25/1992(27 yrs)  Name:       BSmith Powell                Visit Date: 11/04/2018 11:16 am ---------------------------------------------------------------------- Performed By  Performed By:     KJeanene ErbBS,      Ref. Address:     [address]                                                            [place] Attending:  Corenthian Booker      [facility]                    MD                                       [facility]  Referred By:      Kathie Dike Gorman ---------------------------------------------------------------------- Orders   #  Description                          Code         Ordered By   1  [place] MFM OB DETAIL +14 [place]              76811.01     LISA LEFTWICH-                                                        [name]  ----------------------------------------------------------------------   #  Order #                    Accession #                 Episode #   1  [id number]                  [id number]                  [id number]  ---------------------------------------------------------------------- Indications   [redacted] weeks gestation of pregnancy                Z3A.19   Encounter for antenatal screening for          Z36.3   malformations   Echogenic intracardiac focus of the heart      O35.8XX0   (EIF)(Negative AFP)(NIPS Low Risk)   Tobacco use complicating pregnancy,            O99.332   second trimester   [place]  ---------------------------------------------------------------------- Fetal Evaluation  Num Of Fetuses:         1  Fetal Heart  Rate(bpm):  150  Cardiac Activity:       Observed  Presentation:           Cephalic  Placenta:               Posterior  P. Cord Insertion:      Visualized  Amniotic Fluid  AFI FV:      Within normal limits                              Largest Pocket(cm)                              6.0 ---------------------------------------------------------------------- Biometry  BPD:      44.3  mm     G. Age:  19w 3d         68  %  CI:        77.58   %    70 - 86                                                          FL/HC:      19.1   %    16.1 - 18.3  HC:      159.2  mm     G. Age:  18w 5d         31  %    HC/AC:      1.09        1.09 - 1.39  AC:      146.1  mm     G. Age:  19w 6d         75  %    FL/BPD:     68.6   %  FL:       30.4  mm     G. Age:  19w 3d         69  %    FL/AC:      20.8   %    20 - 24  Est. FW:     301  gm    0 lb 11 oz      79  % ---------------------------------------------------------------------- OB History  Gravidity:    2          SAB:   1 ---------------------------------------------------------------------- Gestational Age  LMP:           19w 0d        Date:  06/24/18                 EDD:   03/31/19  U/S Today:     19w 3d                                        EDD:   03/28/19  Best:          19w 0d     Det. By:  LMP  (06/24/18)          EDD:   03/31/19 ---------------------------------------------------------------------- Anatomy  Cranium:               Appears normal         Aortic Arch:            Appears normal  Cavum:                 Appears normal         Ductal Arch:            Not well visualized  Ventricles:            Appears normal         Diaphragm:              Appears normal  Choroid Plexus:        Appears normal         Stomach:                Appears normal, left  sided  Cerebellum:            Appears normal         Abdomen:                Appears normal  Posterior Fossa:       Appears normal         Abdominal  Wall:         Appears nml (cord                                                                        insert, abd wall)  Nuchal Fold:           Appears normal         Cord Vessels:           Appears normal (3                                                                        vessel cord)  Face:                  Appears normal         Kidneys:                Appear normal                         (orbits and profile)  Lips:                  Appears normal         Bladder:                Appears normal  Thoracic:              Appears normal         Spine:                  Appears normal  Heart:                 Echogenic focus        Upper Extremities:      Appears normal                         in LV  RVOT:                  Appears normal         Lower Extremities:      Appears normal  LVOT:                  Appears normal  Other:  Female gender Heels and 5th digit visualized. Technically difficult due          to fetal position. ---------------------------------------------------------------------- Cervix Uterus Adnexa  Cervix  Length:            3.1  cm.  Normal appearance by transabdominal scan. ----------------------------------------------------------------------  Impression  Normal interval growth.  No ultrasonic evidence of structural  fetal anomalies.  Low risk NIPS  Isolate echogenic intracardiac focus, I discussed the benign  nature of an isolated EIF in the context of a low risk NIPS. I  explained that there is no impact of the structure or function  of the fetal heart. ---------------------------------------------------------------------- Recommendations  Follow up growth in 4-6 weeks. ----------------------------------------------------------------------               Sander Nephew, MD Electronically Signed Final Report   11/04/2018 03:39 pm ----------------------------------------------------------------------   Assessment and Plan:  Pregnancy: G2P0010 at 38w0d1. Supervision of other normal  pregnancy, antepartum - Patient doing well, no complaints, has questions about lab work on 6/29 - Educated and discussed results of elevated liver function test. Patient reports she was taking Tylenol for HA but denies having HA's over the past month, BP normal according to babyscripts  - Denies heavy alcohol use prior to pregnancy, denies heavy use of ibuprofen or NSAIDs prior to pregnancy  - Patient reports back pain during work, Rx for maternity support belt for work (International aid/development worker printed so that patient can pick up at lab appointment, recommends Dr SDonnie Mesafeet support cushions for use at work, patient verbalizes understanding  - Anticipatory guidance on upcoming appointments  - Elastic Bandages & Supports (COMFORT FIT MATERNITY SUPP LG) MISC; 1 Units by Does not apply route daily.  Dispense: 1 each; Refill: 0  2. Iron deficiency anemia due to chronic blood loss - Patient being seen by hematology  - Was switched to different iron supplement and follow up appointment on 8/10 will assess if she need iron transfusion per patient   3. 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 Elevated liver function tests - Labs on 6/29 noted AST 95 and ALT 128 at less than [redacted] weeks gestation - Discussed results with Dr DHulan Fraywho recommends patient be seen by MD at next appointment, continue with hematology  - Full hepatitis panel ordered to assess for etiology of elevated livers  - Hepatitis, Acute; Future - Comp Met (CMET); Future   Preterm labor symptoms and general obstetric precautions including but not limited to vaginal bleeding, contractions, leaking of fluid and fetal movement were reviewed in detail with the patient. I discussed the assessment and treatment plan with the patient. The patient was provided an opportunity to ask questions and all were answered. The patient agreed with the plan and demonstrated an  understanding of the instructions. The patient was advised to call back or seek an in-person office evaluation/[facility]. Please refer to After Visit Summary for other counseling recommendations.   I provided 14 minutes of face-to-face time during this encounter.  Return in about 6 weeks (around 12/30/2018) for ROB/2hrGTT.  Future Appointments  Date Time Provider [place] 11/23/2018  9:30 AM [facility] None  12/19/2018 10:00 AM [facility] 1 [facility] None  12/19/2018 10:40 AM KBrunetta Genera [facility]  12/30/2018  9:15 AM [facility] None  12/30/2018  9:30 AM PDonnamae Jude MD [facility] [place]    VLajean Manes CAvocafor WDe[facility] Foods Company [place]

## 2018-11-23 ENCOUNTER — Other Ambulatory Visit: Payer: Self-pay

## 2018-11-23 ENCOUNTER — Other Ambulatory Visit: Payer: Managed Care, Other (non HMO)

## 2018-11-23 DIAGNOSIS — R7989 Other specified abnormal findings of blood chemistry: Secondary | ICD-10-CM

## 2018-11-23 DIAGNOSIS — O99012 Anemia complicating pregnancy, second trimester: Secondary | ICD-10-CM

## 2018-11-23 DIAGNOSIS — R768 Other specified abnormal immunological findings in serum: Secondary | ICD-10-CM

## 2018-11-23 DIAGNOSIS — D500 Iron deficiency anemia secondary to blood loss (chronic): Secondary | ICD-10-CM

## 2018-11-24 LAB — COMPREHENSIVE METABOLIC PANEL
ALT: 35 IU/L — ABNORMAL HIGH (ref 0–32)
AST: 34 IU/L (ref 0–40)
Albumin/Globulin Ratio: 1.3 (ref 1.2–2.2)
Albumin: 3.5 g/dL — ABNORMAL LOW (ref 3.9–5.0)
Alkaline Phosphatase: 52 IU/L (ref 39–117)
BUN/Creatinine Ratio: 14 (ref 9–23)
BUN: 8 mg/dL (ref 6–20)
Bilirubin Total: 0.5 mg/dL (ref 0.0–1.2)
CO2: 19 mmol/L — ABNORMAL LOW (ref 20–29)
Calcium: 8.7 mg/dL (ref 8.7–10.2)
Chloride: 103 mmol/L (ref 96–106)
Creatinine, Ser: 0.57 mg/dL (ref 0.57–1.00)
GFR calc Af Amer: 149 mL/min/{1.73_m2} (ref >59)
GFR calc non Af Amer: 129 mL/min/{1.73_m2} (ref >59)
Globulin, Total: 2.7 g/dL (ref 1.5–4.5)
Glucose: 78 mg/dL (ref 65–99)
Potassium: 4.1 mmol/L (ref 3.5–5.2)
Sodium: 135 mmol/L (ref 134–144)
Total Protein: 6.2 g/dL (ref 6.0–8.5)

## 2018-11-24 LAB — HEPATITIS PANEL, ACUTE
Hep A IgM: NEGATIVE
Hep B C IgM: NEGATIVE
Hep C Virus Ab: <0.1 s/co ratio (ref 0.0–0.9)
Hepatitis B Surface Ag: NEGATIVE

## 2018-12-19 ENCOUNTER — Other Ambulatory Visit: Payer: Managed Care, Other (non HMO)

## 2018-12-19 ENCOUNTER — Other Ambulatory Visit: Payer: Self-pay | Admitting: *Deleted

## 2018-12-19 ENCOUNTER — Ambulatory Visit: Payer: Managed Care, Other (non HMO) | Admitting: Hematology

## 2018-12-19 DIAGNOSIS — D500 Iron deficiency anemia secondary to blood loss (chronic): Secondary | ICD-10-CM

## 2018-12-20 ENCOUNTER — Telehealth: Payer: Self-pay | Admitting: Hematology

## 2018-12-20 NOTE — Telephone Encounter (Signed)
Called pt per 8/10 sch message - unable to reach p t. Left message for patient to call in to reschedule appt.

## 2018-12-30 ENCOUNTER — Other Ambulatory Visit: Payer: Self-pay

## 2018-12-30 ENCOUNTER — Ambulatory Visit (INDEPENDENT_AMBULATORY_CARE_PROVIDER_SITE_OTHER): Payer: Managed Care, Other (non HMO) | Admitting: Family Medicine

## 2018-12-30 ENCOUNTER — Other Ambulatory Visit: Payer: Managed Care, Other (non HMO)

## 2018-12-30 VITALS — BP 118/77 | HR 78 | Wt 188.3 lb

## 2018-12-30 DIAGNOSIS — R768 Other specified abnormal immunological findings in serum: Secondary | ICD-10-CM

## 2018-12-30 DIAGNOSIS — Z3480 Encounter for supervision of other normal pregnancy, unspecified trimester: Secondary | ICD-10-CM

## 2018-12-30 DIAGNOSIS — Z3A27 27 weeks gestation of pregnancy: Secondary | ICD-10-CM

## 2018-12-30 DIAGNOSIS — D500 Iron deficiency anemia secondary to blood loss (chronic): Secondary | ICD-10-CM

## 2018-12-30 DIAGNOSIS — Z23 Encounter for immunization: Secondary | ICD-10-CM | POA: Diagnosis not present

## 2018-12-30 DIAGNOSIS — O99012 Anemia complicating pregnancy, second trimester: Secondary | ICD-10-CM

## 2018-12-30 DIAGNOSIS — O288 Other abnormal findings on antenatal screening of mother: Secondary | ICD-10-CM

## 2018-12-30 NOTE — Progress Notes (Addendum)
Subjective:  Katie Powell is a 28 y.o. G2P0010 at [redacted]w[redacted]d being seen today for ongoing prenatal care.  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  Patient reports bilateral feet swelling, but she does note she spends a lot of time on her feet at work (works as a Freight forwarder at [employer]).  Contractions: Not present. Vag. Bleeding: None.  Movement: Present. Denies leaking of fluid.  The following portions of the patient's history were reviewed and updated as appropriate: allergies, current medications, past family history, past medical history, past social history, past surgical history and problem list. Problem list updated.  Objective:   Vitals:   12/30/18 0921  BP: 118/77  Pulse: 78  Weight: 188 lb 4.8 oz (85.4 kg)    Fetal Status: Fetal Heart Rate (bpm): 145   Movement: Present     General:  Alert, oriented and cooperative. Patient is in no acute distress.  Skin: Skin is warm and dry. No rash noted.   Cardiovascular: Normal heart rate noted  Respiratory: Normal respiratory effort, no problems with respiration noted  Abdomen: Soft, gravid, appropriate for gestational age. Pain/Pressure: Absent     Pelvic: Vag. Bleeding: None   (per patient) Cervical exam deferred        Extremities: Normal range of motion.  Edema: Mild in bilateral ankles  Mental Status: Normal mood and affect. Normal behavior. Normal judgment and thought content.   Urinalysis:      Assessment and Plan:  Pregnancy: G2P0010 at [redacted]w[redacted]d  1. Supervision of other normal pregnancy, antepartum Pt doing well, reports bilateral feet swelling but no other complaints. Fetal movement good, reports fetal movements every 2  hrs.  - Glucose Tolerance, 2 Hours w/1 Hour - CBC - HIV antibody - RPR - Tdap vaccine greater than or equal to 7yo IM given - flu shot offered but declined  2. 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 Hx of transfusion in 2018 for anemia, pt states it was difficult to find blood for her. Per UpToDate--titers > 16 require MFM f/u--will await results. - Antibody titer  3. Hx of anemia. - on iron  Preterm labor symptoms and general obstetric precautions including but not limited to vaginal bleeding, contractions, leaking of fluid and fetal movement were reviewed in detail with the patient.  Please refer to After Visit Summary for other counseling recommendations.  Return in 2 weeks (on 01/13/2019) for [facility], virtual.   Jones Broom, Medical Student

## 2018-12-30 NOTE — Patient Instructions (Signed)

## 2018-12-30 NOTE — Progress Notes (Signed)
Pt presents for ROB. Pt has no concerns today. Pt does want her TDAP vaccine today. Pt declines flu vaccine today.

## 2018-12-31 LAB — HIV ANTIBODY (ROUTINE TESTING W REFLEX): HIV Screen 4th Generation wRfx: NONREACTIVE

## 2018-12-31 LAB — CBC
Hematocrit: 25.5 % — ABNORMAL LOW (ref 34.0–46.6)
Hemoglobin: 7.6 g/dL — ABNORMAL LOW (ref 11.1–15.9)
MCH: 20.4 pg — ABNORMAL LOW (ref 26.6–33.0)
MCHC: 29.8 g/dL — ABNORMAL LOW (ref 31.5–35.7)
MCV: 69 fL — ABNORMAL LOW (ref 79–97)
Platelets: 281 10*3/uL (ref 150–450)
RBC: 3.72 x10E6/uL — ABNORMAL LOW (ref 3.77–5.28)
RDW: 18.7 % — ABNORMAL HIGH (ref 11.7–15.4)
WBC: 7.9 10*3/uL (ref 3.4–10.8)

## 2018-12-31 LAB — GLUCOSE TOLERANCE, 2 HOURS W/ 1HR
Glucose, 1 hour: 77 mg/dL (ref 65–179)
Glucose, 2 hour: 72 mg/dL (ref 65–152)
Glucose, Fasting: 82 mg/dL (ref 65–91)

## 2018-12-31 LAB — RPR: RPR Ser Ql: NONREACTIVE

## 2019-01-02 ENCOUNTER — Telehealth: Payer: Self-pay

## 2019-01-02 NOTE — Telephone Encounter (Signed)
-----   Message from Donnamae Jude, MD sent at 01/02/2019  8:26 AM EDT ----- She is very anemic despite oral efforts--put in orders for IV iron--please schedule.

## 2019-01-02 NOTE — Telephone Encounter (Signed)
Left message that IV Iron appt will be reschedule and she should make every effort to keep the up coming appt.   Patient returned call and was notified of need for IV Iron, she verbalized understanding and agreed to keep appt.

## 2019-01-03 LAB — AB SCR+ANTIBODY ID: Antibody Screen: POSITIVE — AB

## 2019-01-03 LAB — ANTIBODY SCREEN

## 2019-01-04 ENCOUNTER — Telehealth: Payer: Self-pay | Admitting: Hematology

## 2019-01-04 NOTE — Telephone Encounter (Signed)
Rescheduled missed appointment from 08/10 los, appointment has been rescheduled to 09/10.

## 2019-01-04 NOTE — Telephone Encounter (Signed)
Opened by accident

## 2019-01-10 ENCOUNTER — Telehealth: Payer: Self-pay | Admitting: *Deleted

## 2019-01-10 NOTE — Telephone Encounter (Signed)
Telephone call to patient to coordinate Antibody Screening and titers with [facility].   Patient was not in, but left message for her to call.   Aluel will need to go to [facility] (call [phone]) to notify blood bank of when to expect patient to come in.  Blood bank will need an order on Rx pad for ABO RH antibody screen signed and faxed to [phone] ([facility] fax)   Blood bank will attempt all screening and will send out to reference lab if needed.   Patient should present to [facility] reception desk and ask for Outpatient Clinical Lab located on 1st floor. Patient will need to bring Rx order or it may be faxed prior to arrival.    Blood bank will need to be notified of scheduled date/time at above number.

## 2019-01-13 ENCOUNTER — Telehealth: Payer: Self-pay

## 2019-01-13 NOTE — Telephone Encounter (Signed)
Patient called and left message on the triage vm about maternity leave questions. Returned patient call, no answer, left vm for patient to call us back to discuss.

## 2019-01-17 ENCOUNTER — Telehealth (INDEPENDENT_AMBULATORY_CARE_PROVIDER_SITE_OTHER): Payer: Managed Care, Other (non HMO) | Admitting: Obstetrics and Gynecology

## 2019-01-17 ENCOUNTER — Encounter: Payer: Self-pay | Admitting: Obstetrics and Gynecology

## 2019-01-17 ENCOUNTER — Other Ambulatory Visit: Payer: Self-pay | Admitting: Obstetrics and Gynecology

## 2019-01-17 DIAGNOSIS — R768 Other specified abnormal immunological findings in serum: Secondary | ICD-10-CM

## 2019-01-17 DIAGNOSIS — D500 Iron deficiency anemia secondary to blood loss (chronic): Secondary | ICD-10-CM

## 2019-01-17 DIAGNOSIS — Z3483 Encounter for supervision of other normal pregnancy, third trimester: Secondary | ICD-10-CM

## 2019-01-17 DIAGNOSIS — Z3480 Encounter for supervision of other normal pregnancy, unspecified trimester: Secondary | ICD-10-CM

## 2019-01-17 DIAGNOSIS — Z3A29 29 weeks gestation of pregnancy: Secondary | ICD-10-CM

## 2019-01-17 NOTE — Progress Notes (Signed)
   [facility] VIRTUAL VIDEO VISIT ENCOUNTER NOTE  Provider location: Center for [facility] at [place]   I connected with Katie Powell on 01/17/19 at  8:45 AM EDT by MyChart Video Encounter at home and verified that I am speaking with the correct person using two identifiers.   I discussed the limitations, risks, security and privacy concerns of performing an evaluation and management service virtually and the availability of in person appointments. I also discussed with the patient that there may be a patient responsible charge related to this service. The patient expressed understanding and agreed to proceed. Subjective:  Katie Powell is a 28 y.o. G2P0010 at [redacted]w[redacted]d being seen today for ongoing prenatal care.  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  Patient reports no complaints.  Contractions: Irritability. Vag. Bleeding: None.  Movement: Present. Denies any leaking of fluid.   The following portions of the patient's history were reviewed and updated as appropriate: allergies, current medications, past family history, past medical history, past social history, past surgical history and problem list.   Objective:  There were no vitals filed for this visit.  Fetal Status:     Movement: Present     General:  Alert, oriented and cooperative. Patient is in no acute distress.  Respiratory: Normal respiratory effort, no problems with respiration noted  Mental Status: Normal mood and affect. Normal behavior. Normal judgment and thought content.  Rest of physical exam deferred due to type of encounter  Imaging: No results found.  Assessment and Plan:  Pregnancy: G2P0010 at  [redacted]w[redacted]d 1. Supervision of other normal pregnancy, antepartum Patient is doing well without complaints Patient requesting to start maternity leave around Nov 1. Will provide 36 week covid letter Patient remains undecided regarding pediatrician  2. Iron deficiency anemia due to chronic blood loss Continue iron supplement Scheduled for feraheme on 9/10 Patient to follow up with [facility] for type and screen given positive antibody screen   Preterm labor symptoms and general obstetric precautions including but not limited to vaginal bleeding, contractions, leaking of fluid and fetal movement were reviewed in detail with the patient. I discussed the assessment and treatment plan with the patient. The patient was provided an opportunity to ask questions and all were answered. The patient agreed with the plan and demonstrated an understanding of the instructions. The patient was advised to call back or seek an in-person office evaluation/go to [facility] at [facility] for any urgent or concerning symptoms. Please refer to After Visit Summary for other counseling recommendations.   I provided 15 minutes of face-to-face time during this encounter.  Return in about 2 weeks (around 01/31/2019) for [facility], [place], Low risk.  Future Appointments  Date Time Provider [place]  01/19/2019  8:00 AM [facility] 6 [facility] None  01/19/2019  8:40 AM Kale, Cloria [place], MD [facility] None    Mora Bellman, MD Center for [facility], [place]

## 2019-01-17 NOTE — Progress Notes (Signed)
I connected with  Katie Powell on 01/17/19 by a video enabled telemedicine application and verified that I am speaking with the correct person using two identifiers.  MyChart ROB, reports no problems today.  She wants to wait until 8:45 am to complete her appt.

## 2019-01-18 NOTE — Progress Notes (Signed)
HEMATOLOGY/ONCOLOGY CLINIC NOTE  Date of Service: 01/19/2019  Patient Care Team: Patient, No Pcp Per as PCP - General (General Practice)  CHIEF COMPLAINTS/PURPOSE OF CONSULTATION:  Anemia in Pregnancy   HISTORY OF PRESENTING ILLNESS:  Katie Powell is a wonderful 28 y.o. female who has been referred to [place] by [place], CNM for evaluation and management of Anemia in pregnancy. The pt reports that she is doing well overall. She is currently at [redacted] weeks gestation.  The pt reports that she was first told she was anemic about 4 years ago when her HGB was at 6. She was given a blood transfusion and IV iron replacement, and was give PO Iron replacement which caused constipation. She subsequently stopped taking PO iron after about 3 months.   Recently, the pt has been taking Integra once a day for the past 2 months, and Prenatal vitamins as well. She had unidentified antibodies on 10/07/18 antibody screen and is being followed by high risk pregnancy.  The pt notes that she has been very fatigued recently. She endorses crushed ice and corn starch cravings. She also endorses light headedness and dizziness occasionally, especially when standing after lying down. She is gaining weight appropriately. She feels that she has been feeling less fatigued, light headed and dizzy since beginning PO iron replacement two months ago. She notes that she is constipated and is having bowel movements every 3 days.  The pt notes that her periods previously lasted 7 days and were very heavy. She attempted PO contraceptives which were not able to control her periods. She has also tried depo shots which caused worsened bleeding. She denies being otherwise evaluated. The pt denies nose bleeds or gum bleeds at baseline. She has had some recent nose bleeds which occur when she blows her nose.  The pt had one previous pregnancy which resulted in a miscarriage. The pt notes that she was adopted and is not familiar with  her biological family medical history.   The pt notes that she was smoking up to 8 cigarettes a day prior to pregnancy, and now she is on a patch.  Most recent lab results (10/07/18) of CBC is as follows: all values are WNL except for HGB at 7.9, HCT at 26.8%, MCV at 68, MCH at 20.2, MCHC at 29.5, RDW at 21.7.  On review of systems, pt reports history of heavy periods, recent fatigue, occasional light headedness and dizziness, recent nose bleeds, constipation, appropriate weight gain, and denies gum bleeds, abdominal pains, and any other symptoms.   On PMHx the pt reports anemia, finger surgery, eye surgery. On Social Hx the pt reports previously smoking 8 cigarettes per day, but quit when she became pregnant. Works at a [employer]. On Family Hx the pt reports that she is adopted and is unfamiliar with biological family history.   INTERVAL HISTORY:  Katie Powell is a 28 y.o. female here for evaluation and management of Iron Deficiency Anemia in pregnancy. The patient's last visit with [place] was on 11/07/2018. The pt reports that she is doing well overall.  The pt reports that she continues to take her iron supplementation. She has had no other bleeding issues. She continues to take her normal pre-natal vitamins as prescribed.   Lab results today (01/19/19) of CBC w/diff and CMP is as follows: all values are WNL except for RBC at 3.73, Hgb at 7.2, HCT at 25.2, MCV at 67.6, MCH at 19.3, HCHC at 28.6, RDW at  18.5, CO2 at 21, Calcium at 8.5, and albumin at 2.9  On review of systems, pt reports dizziness, good appetite, and denies lightheadedness and any other symptoms.     MEDICAL HISTORY:  Past Medical History:  Diagnosis Date  . Anemia   . Anxiety     SURGICAL HISTORY: Past Surgical History:  Procedure Laterality Date  . EYE SURGERY    . FINGER SURGERY      SOCIAL HISTORY: Social History   Socioeconomic History  . Marital status: Single    Spouse name: Not on file   . Number of children: Not on file  . Years of education: Not on file  . Highest education level: Not on file  Occupational History  . Not on file  Social Needs  . Financial resource strain: Not on file  . Food insecurity    Worry: Not on file    Inability: Not on file  . Transportation needs    Medical: Not on file    Non-medical: Not on file  Tobacco Use  . Smoking status: Current Every Day Smoker    Packs/day: 0.50    Types: Cigarettes  . Smokeless tobacco: Never Used  Substance and Sexual Activity  . Alcohol use: Yes    Comment: occasionally  . Drug use: No  . Sexual activity: Yes    Birth control/protection: Injection  Lifestyle  . Physical activity    Days per week: Not on file    Minutes per session: Not on file  . Stress: Not on file  Relationships  . Social Herbalist on phone: Not on file    Gets together: Not on file    Attends religious service: Not on file    Active member of club or organization: Not on file    Attends meetings of clubs or organizations: Not on file    Relationship status: Not on file  . Intimate partner violence    Fear of current or ex partner: Not on file    Emotionally abused: Not on file    Physically abused: Not on file    Forced sexual activity: Not on file  Other Topics Concern  . Not on file  Social History Narrative  . Not on file    FAMILY HISTORY: No family history on file.  ALLERGIES:  has No Known Allergies.  MEDICATIONS:  Current Outpatient Medications  Medication Sig Dispense Refill  . docusate sodium (COLACE) 100 MG capsule Take 1 capsule (100 mg total) by mouth 2 (two) times daily as needed for mild constipation or moderate constipation. 60 capsule 0  . Doxylamine-Pyridoxine (DICLEGIS) 10-10 MG TBEC Take 2 tabs at bedtime. If needed, add another tab in the morning. If needed, add another tab in the afternoon, up to 4 tabs/day. 100 tablet 5  . Elastic Bandages & Supports (COMFORT FIT MATERNITY SUPP LG)  MISC 1 Units by Does not apply route daily. 1 each 0  . Fe Fum-FePoly-Vit C-Vit B3 (INTEGRA) 62.5-62.5-40-3 MG CAPS Take 1 capsule by mouth daily. 30 capsule 2  . folic acid (FOLVITE) 1 MG tablet Take 1 tablet (1 mg total) by mouth daily.    . iron polysaccharides (NIFEREX) 150 MG capsule Take 1 capsule (150 mg total) by mouth 2 (two) times daily. 60 capsule 5  . polyethylene glycol (MIRALAX / GLYCOLAX) 17 g packet Take 17 g by mouth daily.    . Prenatal Vit w/Fe-Methylfol-FA (PNV PO) Take by mouth.  No current facility-administered medications for this visit.     REVIEW OF SYSTEMS:   A 10+ POINT REVIEW OF SYSTEMS WAS OBTAINED including neurology, dermatology, psychiatry, cardiac, respiratory, lymph, extremities, GI, GU, Musculoskeletal, constitutional, breasts, reproductive, HEENT.  All pertinent positives are noted in the HPI.  All others are negative.    PHYSICAL EXAMINATION:  Vitals:   01/19/19 0900  BP: 121/70  Pulse: 92  Resp: 18  Temp: 98.5 F (36.9 C)  SpO2: 100%   Filed Weights   01/19/19 0900  Weight: 191 lb 6.4 oz (86.8 kg)   Body mass index is 30.89 kg/m.   GENERAL:alert, in no acute distress and comfortable SKIN: no acute rashes, no significant lesions EYES: conjunctiva are pink and non-injected, sclera anicteric OROPHARYNX: MMM, no exudates, no oropharyngeal erythema or ulceration NECK: supple, no JVD LYMPH:  no palpable lymphadenopathy in the cervical, axillary or inguinal regions LUNGS: clear to auscultation b/l with normal respiratory effort HEART: regular rate & rhythm ABDOMEN:  normoactive bowel sounds , non tender, not distended. Extremity: no pedal edema PSYCH: alert & oriented x 3 with fluent speech NEURO: no focal motor/sensory deficits    LABORATORY DATA:  I have reviewed the data as listed  . CBC Latest Ref Rng & Units 01/19/2019 12/30/2018 11/07/2018  WBC 4.0 - 10.5 K/uL 8.9 7.9 6.4  Hemoglobin 12.0 - 15.0 g/dL 7.2(L) 7.6(L) 8.1(L)   Hematocrit 36.0 - 46.0 % 25.2(L) 25.5(L) 26.9(L)  Platelets 150 - 400 K/uL 264 281 268    . CMP Latest Ref Rng & Units 11/23/2018 11/07/2018 11/03/2017  Glucose 65 - 99 mg/dL 78 80 89  BUN 6 - 20 mg/dL 8 9 14   Creatinine 0.57 - 1.00 mg/dL 0.57 0.61 0.65  Sodium 134 - 144 mmol/L 135 137 137  Potassium 3.5 - 5.2 mmol/L 4.1 3.9 3.3(L)  Chloride 96 - 106 mmol/L 103 108 107  CO2 20 - 29 mmol/L 19(L) 22 24  Calcium 8.7 - 10.2 mg/dL 8.7 8.3(L) 8.7(L)  Total Protein 6.0 - 8.5 g/dL 6.2 7.0 -  Total Bilirubin 0.0 - 1.2 mg/dL 0.5 0.4 -  Alkaline Phos 39 - 117 IU/L 52 50 -  AST 0 - 40 IU/L 34 95(H) -  ALT 0 - 32 IU/L 35(H) 128(H) -   . Lab Results  Component Value Date   IRON 22 (L) 01/19/2019   TIBC 505 (H) 01/19/2019   IRONPCTSAT 4 (L) 01/19/2019   (Iron and TIBC)  Lab Results  Component Value Date   FERRITIN <4 (L) 01/19/2019     RADIOGRAPHIC STUDIES: I have personally reviewed the radiological images as listed and agreed with the findings in the report.   ASSESSMENT & PLAN:   28 y.o. female with  1. Iron Deficiency Anemia in pregnancy  Secondary to menorrhagia pre pregnancy and now due to increased iron needs due to pregnancy  2. Failure of PO iron replacement  3. 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 PLAN: -Discussed pt labwork today, 01/19/19; all values are WNL except for RBC at 3.73, Hgb at 7.2, HCT at 25.2, MCV at 67.6, MCH at 19.3, HCHC at 28.6, RDW at 18.5, CO2 at 21, Calcium at 8.5, and albumin at 2.9 -we discussed the her hgb and iron levels did on improve with short trial of adequate doses of PO iron and that she will need more aggressive treatment with IV Iron -ordered IV Venofer 200mg  twice weekly for 4 doses. -Discussed that the patient need to call her [facility] Gyn doctor  to arrange for PRBC transfusion for symptomatic anemia [facility] [place]. -my RN all called her obgyn MD's office to help facilitate this urgently. -Discussed benefits vs risks associated  with IV iron infusion- patient is arrangeable to proceed. -Discussed need for transfusion at [facility] as the cancer center does not give blood transfusions to pregnant women -Discussed cross type and matching for blood -Recommended to continue iron polysaccharide 150mg  po twice per day  -Recommended going to [facility] hospital ED if she were to get dizzy or lightheaded for more immediate transfusion     FOLLOW UP: IV Venofer twice weekly x 4 doses at [facility] day center. Patient [redacted] weeks pregnant Patient to f/u with her OB Gyn to arrange PRBC transfusion x 1 in hospital under [facility] service. RTC with Dr Irene Limbo in 5 weeks with labs    The total time spent in the appt was 25 minutes and more than 50% was on counseling and direct patient cares.  All of the patient's questions were answered with apparent satisfaction. The patient knows to call the clinic with any problems, questions or concerns.    Sullivan Lone MD MS AAHIVMS [facility] Hematology/Oncology Physician [facility]  (Office):       [phone]  (Work cell):  [phone] (Fax):           [phone]  01/19/2019 9:08 AM   I, Jacqualyn Posey, am acting as a Education administrator for Dr. Sullivan Lone.   .I have reviewed the above documentation for accuracy and completeness, and I agree with the above. Brunetta Genera MD

## 2019-01-19 ENCOUNTER — Other Ambulatory Visit: Payer: Self-pay

## 2019-01-19 ENCOUNTER — Inpatient Hospital Stay (HOSPITAL_BASED_OUTPATIENT_CLINIC_OR_DEPARTMENT_OTHER): Payer: Managed Care, Other (non HMO) | Admitting: Hematology

## 2019-01-19 ENCOUNTER — Other Ambulatory Visit (HOSPITAL_COMMUNITY)
Admission: AD | Admit: 2019-01-19 | Discharge: 2019-01-19 | Disposition: A | Discharge status: Home / Self Care | Payer: Managed Care, Other (non HMO) | Source: Home / Self Care | Attending: Obstetrics and Gynecology | Admitting: Obstetrics and Gynecology

## 2019-01-19 ENCOUNTER — Encounter (HOSPITAL_COMMUNITY)
Admission: RE | Admit: 2019-01-19 | Discharge: 2019-01-19 | Disposition: A | Discharge status: Home / Self Care | Payer: Managed Care, Other (non HMO) | Source: Ambulatory Visit | Attending: Hematology | Admitting: Hematology

## 2019-01-19 ENCOUNTER — Telehealth: Payer: Self-pay | Admitting: *Deleted

## 2019-01-19 ENCOUNTER — Inpatient Hospital Stay: Payer: Managed Care, Other (non HMO) | Attending: Hematology

## 2019-01-19 VITALS — BP 121/70 | HR 92 | Temp 98.5°F | Resp 18 | Ht 66.0 in | Wt 191.4 lb

## 2019-01-19 DIAGNOSIS — D500 Iron deficiency anemia secondary to blood loss (chronic): Secondary | ICD-10-CM

## 2019-01-19 DIAGNOSIS — N920 Excessive and frequent menstruation with regular cycle: Secondary | ICD-10-CM | POA: Insufficient documentation

## 2019-01-19 DIAGNOSIS — O99012 Anemia complicating pregnancy, second trimester: Secondary | ICD-10-CM | POA: Diagnosis not present

## 2019-01-19 LAB — CBC WITH DIFFERENTIAL (CANCER CENTER ONLY)
Abs Immature Granulocytes: 0.07 10*3/uL (ref 0.00–0.07)
Basophils Absolute: 0 10*3/uL (ref 0.0–0.1)
Basophils Relative: 0 %
Eosinophils Absolute: 0.2 10*3/uL (ref 0.0–0.5)
Eosinophils Relative: 2 %
HCT: 25.2 % — ABNORMAL LOW (ref 36.0–46.0)
Hemoglobin: 7.2 g/dL — ABNORMAL LOW (ref 12.0–15.0)
Immature Granulocytes: 1 %
Lymphocytes Relative: 21 %
Lymphs Abs: 1.9 10*3/uL (ref 0.7–4.0)
MCH: 19.3 pg — ABNORMAL LOW (ref 26.0–34.0)
MCHC: 28.6 g/dL — ABNORMAL LOW (ref 30.0–36.0)
MCV: 67.6 fL — ABNORMAL LOW (ref 80.0–100.0)
Monocytes Absolute: 0.8 10*3/uL (ref 0.1–1.0)
Monocytes Relative: 9 %
Neutro Abs: 6.1 10*3/uL (ref 1.7–7.7)
Neutrophils Relative %: 67 %
Platelet Count: 264 10*3/uL (ref 150–400)
RBC: 3.73 MIL/uL — ABNORMAL LOW (ref 3.87–5.11)
RDW: 18.5 % — ABNORMAL HIGH (ref 11.5–15.5)
WBC Count: 8.9 10*3/uL (ref 4.0–10.5)
nRBC: 0.2 % (ref 0.0–0.2)

## 2019-01-19 LAB — CMP (CANCER CENTER ONLY)
ALT: 18 U/L (ref 0–44)
AST: 22 U/L (ref 15–41)
Albumin: 2.9 g/dL — ABNORMAL LOW (ref 3.5–5.0)
Alkaline Phosphatase: 79 U/L (ref 38–126)
Anion gap: 8 (ref 5–15)
BUN: 9 mg/dL (ref 6–20)
CO2: 21 mmol/L — ABNORMAL LOW (ref 22–32)
Calcium: 8.5 mg/dL — ABNORMAL LOW (ref 8.9–10.3)
Chloride: 108 mmol/L (ref 98–111)
Creatinine: 0.58 mg/dL (ref 0.44–1.00)
GFR, Est AFR Am: >60 mL/min (ref >60)
GFR, Estimated: >60 mL/min (ref >60)
Glucose, Bld: 85 mg/dL (ref 70–99)
Potassium: 3.7 mmol/L (ref 3.5–5.1)
Sodium: 137 mmol/L (ref 135–145)
Total Bilirubin: 0.5 mg/dL (ref 0.3–1.2)
Total Protein: 6.8 g/dL (ref 6.5–8.1)

## 2019-01-19 LAB — SAMPLE TO BLOOD BANK

## 2019-01-19 LAB — ABO/RH: ABO/RH(D): O POS

## 2019-01-19 LAB — IRON AND TIBC
Iron: 22 ug/dL — ABNORMAL LOW (ref 41–142)
Saturation Ratios: 4 % — ABNORMAL LOW (ref 21–57)
TIBC: 505 ug/dL — ABNORMAL HIGH (ref 236–444)
UIBC: 483 ug/dL — ABNORMAL HIGH (ref 120–384)

## 2019-01-19 LAB — FERRITIN: Ferritin: <4 ng/mL — ABNORMAL LOW (ref 11–307)

## 2019-01-19 MED ORDER — SODIUM CHLORIDE 0.9 % IV SOLN
510.0000 mg | INTRAVENOUS | Status: DC
  Administered 2019-01-19: 510 mg via INTRAVENOUS
  Filled 2019-01-19: qty 17

## 2019-01-19 MED ORDER — SODIUM CHLORIDE 0.9 % IV SOLN
200.0000 mg | INTRAVENOUS | Status: DC
  Filled 2019-01-19: qty 10

## 2019-01-19 NOTE — Telephone Encounter (Signed)
Per Dr. Grier Mitts direction: Contacted [facility] and made appt for this morning for patient to receive iron infusion at following appt here at [facility]. Laverne  At [facility] made appt for appt for today at [place] patient by phone and gave patient information r/t appt at [facility] today. Patient stated she will go and was on her way.   Contacted patient's OB at [facility] [phone] (Dr. Elly Modena). Sharilyn Sites (support staff for MD) that patient had been directed to [facility] to receive iron infusion this morning by Dr. Irene Limbo.  Due to pregnancy and documented antibodies in blood, OB will need to arrange transfusion. Hgb 7.2 today. Vinnie Level at [place] took information and stated that patient was directed in past to go to [facility] to provide sample in order to arrange transfusion, but that she did not think patient had gone there at this time. Vinnie Level stated she will contact patient this morning and ask her to go to [facility] once the iron infusion is complete.

## 2019-01-19 NOTE — Telephone Encounter (Signed)
Late entry:  Spoke with Sandy this morning regarding pt visit to the [place] for Anemia and need for transfusion.  Lovey Newcomer states they do not give infusions for OB patients.  An appt was arranged for pt to be seen at [facility] Day center for transfusion today. Lovey Newcomer states that Dr Irene Limbo would recommend that pt have blood transfusion due to Hemoglobin levels-7.2.  Lovey Newcomer made aware that I will discuss this with the provider to determine plan. I made Sandy aware that I would contact pt to have her go to [facility] and be seen in order to have blood drawn for blood bank.  Call was placed to pt to make her aware that she should go to [facility] and have blood drawn after her infusion today. Pt states understanding.  Placed call to Dr Elly Modena to make her aware of recommendations today. She states that pt is to have Iron infusion in hopes to boost her Hgb and get her to delivery date.  Made provider aware that pt should be seen at lab today for blood bank testing.

## 2019-01-20 ENCOUNTER — Telehealth: Payer: Self-pay | Admitting: Hematology

## 2019-01-20 NOTE — Telephone Encounter (Signed)
Scheduled appt per 9/10 los.  Spoke with patient and she is aware of her appt date and time.

## 2019-01-23 LAB — ANTIBODY SCREEN
Antibody Screen: POSITIVE
DAT, IgG: NEGATIVE

## 2019-01-25 ENCOUNTER — Other Ambulatory Visit: Payer: Self-pay | Admitting: Hematology

## 2019-01-25 ENCOUNTER — Telehealth: Payer: Self-pay | Admitting: *Deleted

## 2019-01-25 NOTE — Telephone Encounter (Signed)
Contacted Laverne at [facility] to schedule iron infusion (Venofer) per Dr. Grier Mitts los 01/19/2019: Venofer twice weekly x 4 doses, but Dr. Irene Limbo changed directions to Venofer twice weekly times 2 doses as patient received Ferriheme on 01/19/2019. Per Laverne, first appt is 01/27/2019 at 10 am. Contacted patient to confirm this first appt. Informed her that Otilio Saber will give patient the next appt time when she comes in on Friday. Informed patient that Dr. Irene Limbo would like her to come in the week of 9/28 or 10/5 to have labs drawn. Patient asked to have scheduling contact her as she doesn't have her work schedule yet.  Schedule message sent.

## 2019-01-26 ENCOUNTER — Other Ambulatory Visit: Payer: Self-pay | Admitting: Hematology

## 2019-01-26 ENCOUNTER — Telehealth: Payer: Self-pay | Admitting: Hematology

## 2019-01-26 DIAGNOSIS — D500 Iron deficiency anemia secondary to blood loss (chronic): Secondary | ICD-10-CM

## 2019-01-26 NOTE — Telephone Encounter (Signed)
Scheduled appt per 9/16 sch message - unable to reach pt. Left message with appt date and time

## 2019-01-27 ENCOUNTER — Telehealth: Payer: Self-pay | Admitting: *Deleted

## 2019-01-27 ENCOUNTER — Encounter (HOSPITAL_COMMUNITY)
Admission: RE | Admit: 2019-01-27 | Discharge: 2019-01-27 | Disposition: A | Discharge status: Home / Self Care | Payer: Managed Care, Other (non HMO) | Source: Ambulatory Visit | Attending: Hematology | Admitting: Hematology

## 2019-01-27 DIAGNOSIS — D500 Iron deficiency anemia secondary to blood loss (chronic): Secondary | ICD-10-CM | POA: Diagnosis not present

## 2019-01-27 MED ORDER — LORATADINE 10 MG PO TABS
10.0000 mg | ORAL_TABLET | Freq: Every day | ORAL | Status: DC
  Administered 2019-01-27: 11:00:00 10 mg via ORAL

## 2019-01-27 MED ORDER — ACETAMINOPHEN 325 MG PO TABS
650.0000 mg | ORAL_TABLET | Freq: Once | ORAL | Status: DC
  Administered 2019-01-27: 11:00:00 650 mg via ORAL

## 2019-01-27 MED ORDER — SODIUM CHLORIDE 0.9 % IV SOLN
200.0000 mg | INTRAVENOUS | Status: DC
  Administered 2019-01-27: 11:00:00 200 mg via INTRAVENOUS
  Filled 2019-01-27: qty 10

## 2019-01-27 MED ORDER — ACETAMINOPHEN 325 MG PO TABS
ORAL_TABLET | ORAL | Status: AC
  Administered 2019-01-27: 11:00:00 650 mg via ORAL
  Filled 2019-01-27: qty 2

## 2019-01-27 MED ORDER — LORATADINE 10 MG PO TABS
ORAL_TABLET | ORAL | Status: AC
  Administered 2019-01-27: 10 mg via ORAL
  Filled 2019-01-27: qty 1

## 2019-01-27 NOTE — Telephone Encounter (Signed)
"  Brittney RN, Medical Day.  [name]  here now for treatment.  Administrative instructions for pre-medications seen without orders.  What are we to do with Venofer instructions?"   Provided verbal orders received and read back from Dr. Irene Limbo for acetaminophen 650 mg, loratadine 10 mg premedications present in supportive therapy plan.  Medical Day now able to view supportive plan in chart review.

## 2019-01-31 ENCOUNTER — Telehealth (INDEPENDENT_AMBULATORY_CARE_PROVIDER_SITE_OTHER): Payer: Managed Care, Other (non HMO) | Admitting: Obstetrics & Gynecology

## 2019-01-31 ENCOUNTER — Encounter (HOSPITAL_COMMUNITY): Payer: Managed Care, Other (non HMO)

## 2019-01-31 VITALS — BP 106/69 | HR 83

## 2019-01-31 DIAGNOSIS — Z3A31 31 weeks gestation of pregnancy: Secondary | ICD-10-CM

## 2019-01-31 DIAGNOSIS — D500 Iron deficiency anemia secondary to blood loss (chronic): Secondary | ICD-10-CM

## 2019-01-31 DIAGNOSIS — Z3480 Encounter for supervision of other normal pregnancy, unspecified trimester: Secondary | ICD-10-CM

## 2019-01-31 DIAGNOSIS — R768 Other specified abnormal immunological findings in serum: Secondary | ICD-10-CM

## 2019-01-31 DIAGNOSIS — Z3490 Encounter for supervision of normal pregnancy, unspecified, unspecified trimester: Secondary | ICD-10-CM

## 2019-01-31 DIAGNOSIS — O99113 Other diseases of the blood and blood-forming organs and certain disorders involving the immune mechanism complicating pregnancy, third trimester: Secondary | ICD-10-CM

## 2019-01-31 NOTE — Progress Notes (Signed)
S/w pt for mychart visit. Pt reports fetal movement, denies pain. Pt states she does not have BP cuff with her due to being at work. Pt denies any headache, dizziness, or blurred vision.

## 2019-01-31 NOTE — Progress Notes (Signed)
   TELEHEALTH OBSTETRICS PRENATAL VIRTUAL VIDEO VISIT ENCOUNTER NOTE  Provider location: Center for [facility] at [place]   I connected with Katie Powell on 01/31/19 at 10:45 AM EDT by MyChart Video Encounter at home and verified that I am speaking with the correct person using two identifiers.   I discussed the limitations, risks, security and privacy concerns of performing an evaluation and management service virtually and the availability of in person appointments. I also discussed with the patient that there may be a patient responsible charge related to this service. The patient expressed understanding and agreed to proceed. Subjective:  Katie Powell is a 28 y.o. G2P0010 at [redacted]w[redacted]d being seen today for ongoing prenatal care.  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  Patient reports feel better after transfusion.  Contractions: Not present. Vag. Bleeding: None.  Movement: Present. Denies any leaking of fluid.   The following portions of the patient's history were reviewed and updated as appropriate: allergies, current medications, past family history, past medical history, past social history, past surgical history and problem list.   Objective:  There were no vitals filed for this visit.  Fetal Status:     Movement: Present     General:  Alert, oriented and cooperative. Patient is in no acute distress.  Respiratory: Normal respiratory effort, no problems with respiration noted  Mental Status: Normal mood and affect. Normal behavior. Normal judgment and thought content.  Rest of physical exam deferred due to type of encounter  Imaging: No results found.  Assessment and Plan:  Pregnancy: G2P0010 at [redacted]w[redacted]d 1.  Supervision of other normal pregnancy, antepartum +FM  Pt to come in today to get peds list. Will get BP check at that time.      2. Iron deficiency anemia due to chronic blood loss S/p iron transfusion has 3 more set up   3. 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 I discussed the assessment and treatment plan with the patient. The patient was provided an opportunity to ask questions and all were answered. The patient agreed with the plan and demonstrated an understanding of the instructions. The patient was advised to call back or seek an in-person office evaluation/go to MAU at [facility] for any urgent or concerning symptoms. Please refer to After Visit Summary for other counseling recommendations.   I provided 12 minutes of face-to-face time during this encounter.  Return in about 2 weeks (around 02/14/2019) for Web based.  Future Appointments  Date Time Provider [place]  02/03/2019 10:00 AM [facility] ROOM 8 [facility] None  02/09/2019 10:00 AM [facility] LAB ONLY [facility] None  02/23/2019 11:00 AM [facility] LAB 6 [facility] None  02/23/2019 11:40 AM Kale, Cloria [place], MD [facility] None    Lavonia Drafts, MD Center for [facility], [place]

## 2019-02-03 ENCOUNTER — Encounter (HOSPITAL_COMMUNITY)
Admission: RE | Admit: 2019-02-03 | Discharge: 2019-02-03 | Disposition: A | Discharge status: Home / Self Care | Payer: Managed Care, Other (non HMO) | Source: Ambulatory Visit | Attending: Hematology | Admitting: Hematology

## 2019-02-03 ENCOUNTER — Encounter (HOSPITAL_COMMUNITY): Payer: Managed Care, Other (non HMO)

## 2019-02-03 ENCOUNTER — Other Ambulatory Visit: Payer: Self-pay

## 2019-02-03 DIAGNOSIS — D500 Iron deficiency anemia secondary to blood loss (chronic): Secondary | ICD-10-CM | POA: Diagnosis not present

## 2019-02-03 MED ORDER — ACETAMINOPHEN 325 MG PO TABS
ORAL_TABLET | ORAL | Status: AC
  Filled 2019-02-03: qty 2

## 2019-02-03 MED ORDER — LORATADINE 10 MG PO TABS
ORAL_TABLET | ORAL | Status: AC
  Filled 2019-02-03: qty 1

## 2019-02-03 MED ORDER — SODIUM CHLORIDE 0.9 % IV SOLN
200.0000 mg | INTRAVENOUS | Status: DC
  Administered 2019-02-03: 200 mg via INTRAVENOUS
  Filled 2019-02-03: qty 10

## 2019-02-03 MED ORDER — LORATADINE 10 MG PO TABS
10.0000 mg | ORAL_TABLET | Freq: Every day | ORAL | Status: DC
  Administered 2019-02-03: 10 mg via ORAL

## 2019-02-03 MED ORDER — ACETAMINOPHEN 325 MG PO TABS
650.0000 mg | ORAL_TABLET | Freq: Once | ORAL | Status: AC
  Administered 2019-02-03: 10:00:00 650 mg via ORAL

## 2019-02-07 ENCOUNTER — Encounter (HOSPITAL_COMMUNITY): Payer: Managed Care, Other (non HMO)

## 2019-02-08 ENCOUNTER — Other Ambulatory Visit: Payer: Self-pay | Admitting: *Deleted

## 2019-02-08 DIAGNOSIS — D500 Iron deficiency anemia secondary to blood loss (chronic): Secondary | ICD-10-CM

## 2019-02-09 ENCOUNTER — Inpatient Hospital Stay: Payer: Managed Care, Other (non HMO) | Attending: Hematology

## 2019-02-09 ENCOUNTER — Other Ambulatory Visit: Payer: Self-pay

## 2019-02-09 DIAGNOSIS — D500 Iron deficiency anemia secondary to blood loss (chronic): Secondary | ICD-10-CM

## 2019-02-09 DIAGNOSIS — R5383 Other fatigue: Secondary | ICD-10-CM | POA: Insufficient documentation

## 2019-02-09 DIAGNOSIS — Z3A19 19 weeks gestation of pregnancy: Secondary | ICD-10-CM | POA: Insufficient documentation

## 2019-02-09 DIAGNOSIS — R109 Unspecified abdominal pain: Secondary | ICD-10-CM | POA: Diagnosis not present

## 2019-02-09 DIAGNOSIS — M25473 Effusion, unspecified ankle: Secondary | ICD-10-CM | POA: Insufficient documentation

## 2019-02-09 DIAGNOSIS — Z79899 Other long term (current) drug therapy: Secondary | ICD-10-CM | POA: Diagnosis not present

## 2019-02-09 DIAGNOSIS — O99012 Anemia complicating pregnancy, second trimester: Secondary | ICD-10-CM | POA: Diagnosis not present

## 2019-02-09 DIAGNOSIS — O99612 Diseases of the digestive system complicating pregnancy, second trimester: Secondary | ICD-10-CM | POA: Insufficient documentation

## 2019-02-09 DIAGNOSIS — N920 Excessive and frequent menstruation with regular cycle: Secondary | ICD-10-CM | POA: Insufficient documentation

## 2019-02-09 DIAGNOSIS — M7989 Other specified soft tissue disorders: Secondary | ICD-10-CM | POA: Diagnosis not present

## 2019-02-09 LAB — CMP (CANCER CENTER ONLY)
ALT: 13 U/L (ref 0–44)
AST: 17 U/L (ref 15–41)
Albumin: 3 g/dL — ABNORMAL LOW (ref 3.5–5.0)
Alkaline Phosphatase: 94 U/L (ref 38–126)
Anion gap: 8 (ref 5–15)
BUN: 8 mg/dL (ref 6–20)
CO2: 20 mmol/L — ABNORMAL LOW (ref 22–32)
Calcium: 8.4 mg/dL — ABNORMAL LOW (ref 8.9–10.3)
Chloride: 107 mmol/L (ref 98–111)
Creatinine: 0.6 mg/dL (ref 0.44–1.00)
GFR, Est AFR Am: >60 mL/min (ref >60)
GFR, Estimated: >60 mL/min (ref >60)
Glucose, Bld: 84 mg/dL (ref 70–99)
Potassium: 3.8 mmol/L (ref 3.5–5.1)
Sodium: 135 mmol/L (ref 135–145)
Total Bilirubin: 0.6 mg/dL (ref 0.3–1.2)
Total Protein: 6.9 g/dL (ref 6.5–8.1)

## 2019-02-09 LAB — CBC WITH DIFFERENTIAL (CANCER CENTER ONLY)
Abs Immature Granulocytes: 0.06 10*3/uL (ref 0.00–0.07)
Basophils Absolute: 0 10*3/uL (ref 0.0–0.1)
Basophils Relative: 0 %
Eosinophils Absolute: 0.1 10*3/uL (ref 0.0–0.5)
Eosinophils Relative: 2 %
HCT: 32.3 % — ABNORMAL LOW (ref 36.0–46.0)
Hemoglobin: 9.6 g/dL — ABNORMAL LOW (ref 12.0–15.0)
Immature Granulocytes: 1 %
Lymphocytes Relative: 33 %
Lymphs Abs: 2.1 10*3/uL (ref 0.7–4.0)
MCH: 22.8 pg — ABNORMAL LOW (ref 26.0–34.0)
MCHC: 29.7 g/dL — ABNORMAL LOW (ref 30.0–36.0)
MCV: 76.7 fL — ABNORMAL LOW (ref 80.0–100.0)
Monocytes Absolute: 0.5 10*3/uL (ref 0.1–1.0)
Monocytes Relative: 8 %
Neutro Abs: 3.6 10*3/uL (ref 1.7–7.7)
Neutrophils Relative %: 56 %
Platelet Count: 291 10*3/uL (ref 150–400)
RBC: 4.21 MIL/uL (ref 3.87–5.11)
RDW: 30.7 % — ABNORMAL HIGH (ref 11.5–15.5)
WBC Count: 6.5 10*3/uL (ref 4.0–10.5)
nRBC: 0 % (ref 0.0–0.2)

## 2019-02-09 LAB — IRON AND TIBC
Iron: 31 ug/dL — ABNORMAL LOW (ref 41–142)
Saturation Ratios: 7 % — ABNORMAL LOW (ref 21–57)
TIBC: 429 ug/dL (ref 236–444)
UIBC: 398 ug/dL — ABNORMAL HIGH (ref 120–384)

## 2019-02-09 LAB — FERRITIN: Ferritin: 114 ng/mL (ref 11–307)

## 2019-02-14 ENCOUNTER — Other Ambulatory Visit: Payer: Self-pay

## 2019-02-14 ENCOUNTER — Encounter: Payer: Self-pay | Admitting: Obstetrics and Gynecology

## 2019-02-14 ENCOUNTER — Telehealth (INDEPENDENT_AMBULATORY_CARE_PROVIDER_SITE_OTHER): Payer: Managed Care, Other (non HMO) | Admitting: Obstetrics and Gynecology

## 2019-02-14 VITALS — BP 116/77 | HR 68

## 2019-02-14 DIAGNOSIS — Z3A33 33 weeks gestation of pregnancy: Secondary | ICD-10-CM

## 2019-02-14 DIAGNOSIS — R768 Other specified abnormal immunological findings in serum: Secondary | ICD-10-CM

## 2019-02-14 DIAGNOSIS — Z3480 Encounter for supervision of other normal pregnancy, unspecified trimester: Secondary | ICD-10-CM

## 2019-02-14 DIAGNOSIS — Z3483 Encounter for supervision of other normal pregnancy, third trimester: Secondary | ICD-10-CM

## 2019-02-14 NOTE — Patient Instructions (Signed)
Third Trimester of Pregnancy The third trimester is from week 28 through week 40 (months 7 through 9). The third trimester is a time when the unborn baby (fetus) is growing rapidly. At the end of the ninth month, the fetus is about 20 inches in length and weighs 6-10 pounds. Body changes during your third trimester Your body will continue to go through many changes during pregnancy. The changes vary from woman to woman. During the third trimester:  Your weight will continue to increase. You can expect to gain 25-35 pounds (11-16 kg) by the end of the pregnancy.  You may begin to get stretch marks on your hips, abdomen, and breasts.  You may urinate more often because the fetus is moving lower into your pelvis and pressing on your bladder.  You may develop or continue to have heartburn. This is caused by increased hormones that slow down muscles in the digestive tract.  You may develop or continue to have constipation because increased hormones slow digestion and cause the muscles that push waste through your intestines to relax.  You may develop hemorrhoids. These are swollen veins (varicose veins) in the rectum that can itch or be painful.  You may develop swollen, bulging veins (varicose veins) in your legs.  You may have increased body aches in the pelvis, back, or thighs. This is due to weight gain and increased hormones that are relaxing your joints.  You may have changes in your hair. These can include thickening of your hair, rapid growth, and changes in texture. Some women also have hair loss during or after pregnancy, or hair that feels dry or thin. Your hair will most likely return to normal after your baby is born.  Your breasts will continue to grow and they will continue to become tender. A yellow fluid (colostrum) may leak from your breasts. This is the first milk you are producing for your baby.  Your belly button may stick out.  You may notice more swelling in your hands,  face, or ankles.  You may have increased tingling or numbness in your hands, arms, and legs. The skin on your belly may also feel numb.  You may feel short of breath because of your expanding uterus.  You may have more problems sleeping. This can be caused by the size of your belly, increased need to urinate, and an increase in your body's metabolism.  You may notice the fetus "dropping," or moving lower in your abdomen (lightening).  You may have increased vaginal discharge.  You may notice your joints feel loose and you may have pain around your pelvic bone. What to expect at prenatal visits You will have prenatal exams every 2 weeks until week 36. Then you will have weekly prenatal exams. During a routine prenatal visit:  You will be weighed to make sure you and the baby are growing normally.  Your blood pressure will be taken.  Your abdomen will be measured to track your baby's growth.  The fetal heartbeat will be listened to.  Any test results from the previous visit will be discussed.  You may have a cervical check near your due date to see if your cervix has softened or thinned (effaced).  You will be tested for Group B streptococcus. This happens between 35 and 37 weeks. Your health care provider may ask you:  What your birth plan is.  How you are feeling.  If you are feeling the baby move.  If you have had any abnormal   symptoms, such as leaking fluid, bleeding, severe headaches, or abdominal cramping.  If you are using any tobacco products, including cigarettes, chewing tobacco, and electronic cigarettes.  If you have any questions. Other tests or screenings that may be performed during your third trimester include:  Blood tests that check for low iron levels (anemia).  Fetal testing to check the health, activity level, and growth of the fetus. Testing is done if you have certain medical conditions or if there are problems during the pregnancy.  Nonstress test  (NST). This test checks the health of your baby to make sure there are no signs of problems, such as the baby not getting enough oxygen. During this test, a belt is placed around your belly. The baby is made to move, and its heart rate is monitored during movement. What is false labor? False labor is a condition in which you feel small, irregular tightenings of the muscles in the womb (contractions) that usually go away with rest, changing position, or drinking water. These are called Braxton Hicks contractions. Contractions may last for hours, days, or even weeks before true labor sets in. If contractions come at regular intervals, become more frequent, increase in intensity, or become painful, you should see your health care provider. What are the signs of labor?  Abdominal cramps.  Regular contractions that start at 10 minutes apart and become stronger and more frequent with time.  Contractions that start on the top of the uterus and spread down to the lower abdomen and back.  Increased pelvic pressure and dull back pain.  A watery or bloody mucus discharge that comes from the vagina.  Leaking of amniotic fluid. This is also known as your "water breaking." It could be a slow trickle or a gush. Let your health care provider know if it has a color or strange odor. If you have any of these signs, call your health care provider right away, even if it is before your due date. Follow these instructions at home: Medicines  Follow your health care provider's instructions regarding medicine use. Specific medicines may be either safe or unsafe to take during pregnancy.  Take a prenatal vitamin that contains at least 600 micrograms (mcg) of folic acid.  If you develop constipation, try taking a stool softener if your health care provider approves. Eating and drinking   Eat a balanced diet that includes fresh fruits and vegetables, whole grains, good sources of protein such as meat, eggs, or tofu,  and low-fat dairy. Your health care provider will help you determine the amount of weight gain that is right for you.  Avoid raw meat and uncooked cheese. These carry germs that can cause birth defects in the baby.  If you have low calcium intake from food, talk to your health care provider about whether you should take a daily calcium supplement.  Eat four or five small meals rather than three large meals a day.  Limit foods that are high in fat and processed sugars, such as fried and sweet foods.  To prevent constipation: ? Drink enough fluid to keep your urine clear or pale yellow. ? Eat foods that are high in fiber, such as fresh fruits and vegetables, whole grains, and beans. Activity  Exercise only as directed by your health care provider. Most women can continue their usual exercise routine during pregnancy. Try to exercise for 30 minutes at least 5 days a week. Stop exercising if you experience uterine contractions.  Avoid heavy lifting.  Do   not exercise in extreme heat or humidity, or at high altitudes.  Wear low-heel, comfortable shoes.  Practice good posture.  You may continue to have sex unless your health care provider tells you otherwise. Relieving pain and discomfort  Take frequent breaks and rest with your legs elevated if you have leg cramps or low back pain.  Take warm sitz baths to soothe any pain or discomfort caused by hemorrhoids. Use hemorrhoid cream if your health care provider approves.  Wear a good support bra to prevent discomfort from breast tenderness.  If you develop varicose veins: ? Wear support pantyhose or compression stockings as told by your healthcare provider. ? Elevate your feet for 15 minutes, 3-4 times a day. Prenatal care  Write down your questions. Take them to your prenatal visits.  Keep all your prenatal visits as told by your health care provider. This is important. Safety  Wear your seat belt at all times when driving.  Make  a list of emergency phone numbers, including numbers for family, friends, the hospital, and police and fire departments. General instructions  Avoid cat litter boxes and soil used by cats. These carry germs that can cause birth defects in the baby. If you have a cat, ask someone to clean the litter box for you.  Do not travel far distances unless it is absolutely necessary and only with the approval of your health care provider.  Do not use hot tubs, steam rooms, or saunas.  Do not drink alcohol.  Do not use any products that contain nicotine or tobacco, such as cigarettes and e-cigarettes. If you need help quitting, ask your health care provider.  Do not use any medicinal herbs or unprescribed drugs. These chemicals affect the formation and growth of the baby.  Do not douche or use tampons or scented sanitary pads.  Do not cross your legs for long periods of time.  To prepare for the arrival of your baby: ? Take prenatal classes to understand, practice, and ask questions about labor and delivery. ? Make a trial run to the hospital. ? Visit the hospital and tour the maternity area. ? Arrange for maternity or paternity leave through employers. ? Arrange for family and friends to take care of pets while you are in the hospital. ? Purchase a rear-facing car seat and make sure you know how to install it in your car. ? Pack your hospital bag. ? Prepare the baby's nursery. Make sure to remove all pillows and stuffed animals from the baby's crib to prevent suffocation.  Visit your dentist if you have not gone during your pregnancy. Use a soft toothbrush to brush your teeth and be gentle when you floss. Contact a health care provider if:  You are unsure if you are in labor or if your water has broken.  You become dizzy.  You have mild pelvic cramps, pelvic pressure, or nagging pain in your abdominal area.  You have lower back pain.  You have persistent nausea, vomiting, or diarrhea.   You have an unusual or bad smelling vaginal discharge.  You have pain when you urinate. Get help right away if:  Your water breaks before 37 weeks.  You have regular contractions less than 5 minutes apart before 37 weeks.  You have a fever.  You are leaking fluid from your vagina.  You have spotting or bleeding from your vagina.  You have severe abdominal pain or cramping.  You have rapid weight loss or weight gain.  You have  shortness of breath with chest pain.  You notice sudden or extreme swelling of your face, hands, ankles, feet, or legs.  Your baby makes fewer than 10 movements in 2 hours.  You have severe headaches that do not go away when you take medicine.  You have vision changes. Summary  The third trimester is from week 28 through week 40, months 7 through 9. The third trimester is a time when the unborn baby (fetus) is growing rapidly.  During the third trimester, your discomfort may increase as you and your baby continue to gain weight. You may have abdominal, leg, and back pain, sleeping problems, and an increased need to urinate.  During the third trimester your breasts will keep growing and they will continue to become tender. A yellow fluid (colostrum) may leak from your breasts. This is the first milk you are producing for your baby.  False labor is a condition in which you feel small, irregular tightenings of the muscles in the womb (contractions) that eventually go away. These are called Braxton Hicks contractions. Contractions may last for hours, days, or even weeks before true labor sets in.  Signs of labor can include: abdominal cramps; regular contractions that start at 10 minutes apart and become stronger and more frequent with time; watery or bloody mucus discharge that comes from the vagina; increased pelvic pressure and dull back pain; and leaking of amniotic fluid. This information is not intended to replace advice given to you by your health  care provider. Make sure you discuss any questions you have with your health care provider. Document Released: 04/21/2001 Document Revised: 08/18/2018 Document Reviewed: 06/02/2016 Elsevier Patient Education  2020 [employer]  

## 2019-02-14 NOTE — Progress Notes (Signed)
I connected with  Katie Powell on 02/14/19 by a video enabled telemedicine application and verified that I am speaking with the correct person using two identifiers.   MyChart ROB.  Reports no complaints today.

## 2019-02-14 NOTE — Progress Notes (Signed)
   TELEHEALTH OBSTETRICS PRENATAL VIRTUAL VIDEO VISIT ENCOUNTER NOTE  Provider location: Center for [facility] at [place]   I connected with [name] on 02/14/19 at 11:15 AM EDT by MyChart Video Encounter at home and verified that I am speaking with the correct person using two identifiers.   I discussed the limitations, risks, security and privacy concerns of performing an evaluation and management service virtually and the availability of in person appointments. I also discussed with the patient that there may be a patient responsible charge related to this service. The patient expressed understanding and agreed to proceed. Subjective:  Katie Powell is a 28 y.o. G2P0010 at [redacted]w[redacted]d being seen today for ongoing prenatal care.  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  Patient reports no complaints.  Contractions: Not present. Vag. Bleeding: None.  Movement: Present. Denies any leaking of fluid.   The following portions of the patient's history were reviewed and updated as appropriate: allergies, current medications, past family history, past medical history, past social history, past surgical history and problem list.   Objective:   Vitals:   02/14/19 1113  BP: 116/77  Pulse: 68    Fetal Status:     Movement: Present     General:  Alert, oriented and cooperative. Patient is in no acute distress.  Respiratory: Normal respiratory effort, no problems with respiration noted  Mental Status: Normal mood and affect. Normal behavior. Normal judgment and thought content.  Rest of physical exam deferred due to type of encounter  Imaging: No results found.  Assessment and Plan:  Pregnancy: G2P0010 at [redacted]w[redacted]d 1.  Supervision of other normal pregnancy, antepartum Stable - [place] [facility] OB FOLLOW UP; Future  2. Red blood cell antibody positive, 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 Has completed iron transfusion. Hgb 9.6 Continue with PNV and iron Check growth scan at 36-37 weeks, order placed - [place] [facility] OB FOLLOW UP; Future  Preterm labor symptoms and general obstetric precautions including but not limited to vaginal bleeding, contractions, leaking of fluid and fetal movement were reviewed in detail with the patient. I discussed the assessment and treatment plan with the patient. The patient was provided an opportunity to ask questions and all were answered. The patient agreed with the plan and demonstrated an understanding of the instructions. The patient was advised to call back or seek an in-person office evaluation/go to MAU at [facility] for any urgent or concerning symptoms. Please refer to After Visit Summary for other counseling recommendations.   I provided 8 minutes of face-to-face time during this encounter.  Return in about 2 weeks (around 02/28/2019) for OB visit, face to face.  Future Appointments  Date Time Provider [place]  02/23/2019 11:00 AM [facility] LAB 6 [facility] None  02/23/2019 11:40 AM Brunetta Genera, MD [facility] None    Chancy Milroy, [place] for [facility], [place]

## 2019-02-22 ENCOUNTER — Other Ambulatory Visit: Payer: Self-pay | Admitting: *Deleted

## 2019-02-22 DIAGNOSIS — O99012 Anemia complicating pregnancy, second trimester: Secondary | ICD-10-CM

## 2019-02-22 NOTE — Progress Notes (Signed)
HEMATOLOGY/ONCOLOGY CLINIC NOTE  Date of Service: 02/23/2019  Patient Care Team: Patient, No Pcp Per as PCP - General (General Practice)  CHIEF COMPLAINTS/PURPOSE OF CONSULTATION:  Anemia in Pregnancy   HISTORY OF PRESENTING ILLNESS:  Katie Powell is a wonderful 28 y.o. female who has been referred to [place] by [place], CNM for evaluation and management of Anemia in pregnancy. The pt reports that she is doing well overall. She is currently at [redacted] weeks gestation.  The pt reports that she was first told she was anemic about 4 years ago when her HGB was at 6. She was given a blood transfusion and IV iron replacement, and was give PO Iron replacement which caused constipation. She subsequently stopped taking PO iron after about 3 months.   Recently, the pt has been taking Integra once a day for the past 2 months, and Prenatal vitamins as well. She had unidentified antibodies on 10/07/18 antibody screen and is being followed by high risk pregnancy.  The pt notes that she has been very fatigued recently. She endorses crushed ice and corn starch cravings. She also endorses light headedness and dizziness occasionally, especially when standing after lying down. She is gaining weight appropriately. She feels that she has been feeling less fatigued, light headed and dizzy since beginning PO iron replacement two months ago. She notes that she is constipated and is having bowel movements every 3 days.  The pt notes that her periods previously lasted 7 days and were very heavy. She attempted PO contraceptives which were not able to control her periods. She has also tried depo shots which caused worsened bleeding. She denies being otherwise evaluated. The pt denies nose bleeds or gum bleeds at baseline. She has had some recent nose bleeds which occur when she blows her nose.  The pt had one previous pregnancy which resulted in a miscarriage. The pt notes that she was adopted and is not familiar  with her biological family medical history.   The pt notes that she was smoking up to 8 cigarettes a day prior to pregnancy, and now she is on a patch.  Most recent lab results (10/07/18) of CBC is as follows: all values are WNL except for HGB at 7.9, HCT at 26.8%, MCV at 68, MCH at 20.2, MCHC at 29.5, RDW at 21.7.  On review of systems, pt reports history of heavy periods, recent fatigue, occasional light headedness and dizziness, recent nose bleeds, constipation, appropriate weight gain, and denies gum bleeds, abdominal pains, and any other symptoms.   On PMHx the pt reports anemia, finger surgery, eye surgery. On Social Hx the pt reports previously smoking 8 cigarettes per day, but quit when she became pregnant. Works at a [employer]. On Family Hx the pt reports that she is adopted and is unfamiliar with biological family history.   INTERVAL HISTORY:  Katie Powell is a 28 y.o. female here for evaluation and management of Iron Deficiency Anemia in pregnancy. The patient's last visit with [place] was on 01/19/2019. The pt reports that she is doing well overall and her fatigue has improved and her hgb has improved from 7.2 to 9.6.  The pt reports she has had 3 out of 4 of the IV Iron.    Lab results today (02/23/19) of CBC w/diff and CMP is as follows: all values are WNL except for Hemoglobin at 9.6, HCT at 31.8, MCV at 76.8, Mch at 23.2, RDW at 29.2, Ferritin 21  On  review of systems, pt reports ankle swelling and denies allergy to Iron pills, rashes, stomach issues from iron pills, abdominal pain and any other symptoms.   MEDICAL HISTORY:  Past Medical History:  Diagnosis Date  . Anemia   . Anxiety     SURGICAL HISTORY: Past Surgical History:  Procedure Laterality Date  . EYE SURGERY    . FINGER SURGERY      SOCIAL HISTORY: Social History   Socioeconomic History  . Marital status: Single    Spouse name: Not on file  . Number of children: Not on file  . Years of  education: Not on file  . Highest education level: Not on file  Occupational History  . Not on file  Social Needs  . Financial resource strain: Not on file  . Food insecurity    Worry: Not on file    Inability: Not on file  . Transportation needs    Medical: Not on file    Non-medical: Not on file  Tobacco Use  . Smoking status: Current Every Day Smoker    Packs/day: 0.50    Types: Cigarettes  . Smokeless tobacco: Never Used  Substance and Sexual Activity  . Alcohol use: Yes    Comment: occasionally  . Drug use: No  . Sexual activity: Yes    Birth control/protection: Injection  Lifestyle  . Physical activity    Days per week: Not on file    Minutes per session: Not on file  . Stress: Not on file  Relationships  . Social Herbalist on phone: Not on file    Gets together: Not on file    Attends religious service: Not on file    Active member of club or organization: Not on file    Attends meetings of clubs or organizations: Not on file    Relationship status: Not on file  . Intimate partner violence    Fear of current or ex partner: Not on file    Emotionally abused: Not on file    Physically abused: Not on file    Forced sexual activity: Not on file  Other Topics Concern  . Not on file  Social History Narrative  . Not on file    FAMILY HISTORY: No family history on file.  ALLERGIES:  has No Known Allergies.  MEDICATIONS:  Current Outpatient Medications  Medication Sig Dispense Refill  . docusate sodium (COLACE) 100 MG capsule Take 1 capsule (100 mg total) by mouth 2 (two) times daily as needed for mild constipation or moderate constipation. (Patient not taking: Reported on 01/31/2019) 60 capsule 0  . Doxylamine-Pyridoxine (DICLEGIS) 10-10 MG TBEC Take 2 tabs at bedtime. If needed, add another tab in the morning. If needed, add another tab in the afternoon, up to 4 tabs/day. (Patient not taking: Reported on 01/31/2019) 100 tablet 5  . Elastic Bandages &  Supports (COMFORT FIT MATERNITY SUPP LG) MISC 1 Units by Does not apply route daily. 1 each 0  . folic acid (FOLVITE) 1 MG tablet Take 1 tablet (1 mg total) by mouth daily.    . iron polysaccharides (NIFEREX) 150 MG capsule Take 1 capsule (150 mg total) by mouth 2 (two) times daily. 60 capsule 5  . polyethylene glycol (MIRALAX / GLYCOLAX) 17 g packet Take 17 g by mouth daily.    . Prenatal Vit w/Fe-Methylfol-FA (PNV PO) Take by mouth.     No current facility-administered medications for this visit.     REVIEW  OF SYSTEMS:   A 10+ POINT REVIEW OF SYSTEMS WAS OBTAINED including neurology, dermatology, psychiatry, cardiac, respiratory, lymph, extremities, GI, GU, Musculoskeletal, constitutional, breasts, reproductive, HEENT.  All pertinent positives are noted in the HPI.  All others are negative.     PHYSICAL EXAMINATION:  ECOG FS:1 - Symptomatic but completely ambulatory  There were no vitals filed for this visit. Wt Readings from Last 3 Encounters:  02/03/19 191 lb 6.4 oz (86.8 kg)  01/27/19 191 lb 6.4 oz (86.8 kg)  01/19/19 191 lb 6.4 oz (86.8 kg)   There is no height or weight on file to calculate BMI.    GENERAL:alert, in no acute distress and comfortable SKIN: no acute rashes, no significant lesions EYES: conjunctiva are pink and non-injected, sclera anicteric OROPHARYNX: MMM, no exudates, no oropharyngeal erythema or ulceration NECK: supple, no JVD LYMPH:  no palpable lymphadenopathy in the cervical, axillary or inguinal regions LUNGS: clear to auscultation b/l with normal respiratory effort HEART: regular rate & rhythm ABDOMEN:  normoactive bowel sounds , non tender, not distended. Extremity: no pedal edema PSYCH: alert & oriented x 3 with fluent speech NEURO: no focal motor/sensory deficits     LABORATORY DATA:  I have reviewed the data as listed  . CBC Latest Ref Rng & Units 02/09/2019 01/19/2019 12/30/2018  WBC 4.0 - 10.5 K/uL 6.5 8.9 7.9  Hemoglobin 12.0 - 15.0  g/dL 9.6(L) 7.2(L) 7.6(L)  Hematocrit 36.0 - 46.0 % 32.3(L) 25.2(L) 25.5(L)  Platelets 150 - 400 K/uL 291 264 281    . CMP Latest Ref Rng & Units 02/09/2019 01/19/2019 11/23/2018  Glucose 70 - 99 mg/dL 84 85 78  BUN 6 - 20 mg/dL 8 9 8   Creatinine 0.44 - 1.00 mg/dL 0.60 0.58 0.57  Sodium 135 - 145 mmol/L 135 137 135  Potassium 3.5 - 5.1 mmol/L 3.8 3.7 4.1  Chloride 98 - 111 mmol/L 107 108 103  CO2 22 - 32 mmol/L 20(L) 21(L) 19(L)  Calcium 8.9 - 10.3 mg/dL 8.4(L) 8.5(L) 8.7  Total Protein 6.5 - 8.1 g/dL 6.9 6.8 6.2  Total Bilirubin 0.3 - 1.2 mg/dL 0.6 0.5 0.5  Alkaline Phos 38 - 126 U/L 94 79 52  AST 15 - 41 U/L 17 22 34  ALT 0 - 44 U/L 13 18 35(H)   . Lab Results  Component Value Date   IRON 31 (L) 02/09/2019   TIBC 429 02/09/2019   IRONPCTSAT 7 (L) 02/09/2019   (Iron and TIBC)  Lab Results  Component Value Date   FERRITIN 114 02/09/2019     RADIOGRAPHIC STUDIES: I have personally reviewed the radiological images as listed and agreed with the findings in the report.   ASSESSMENT & PLAN:   28 y.o. female with  1. Iron Deficiency Anemia in pregnancy  Secondary to menorrhagia pre pregnancy and now due to increased iron needs due to pregnancy  2. Failure of PO iron replacement  3. 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  PLAN: -Discussed pt labwork today, 02/23/19; all values are WNL except for Hemoglobin at 9.6, HCT at 31.8, MCV at 76.8, Mch at 23.2, RDW at 29.2 -ferritin 21 -Discussed Hemoglobin increased by 3 points with improvement of hgb from 7.2 to 9.6. -Suggested to continue taking Iron pills through child birth and if she decided to breast feed (iron polysaccharide 150mg  po BID) -Advised that at this time transfusion does not seem likely. -f/u with obgyn and PCP for continued management of po iron replacement.  FOLLOW UP: RTC with Dr   as needed   The total time spent in the appt was 20 minutes and more than 50% was on counseling and direct patient cares.   All of the patient's questions were answered with apparent satisfaction. The patient knows to call the clinic with any problems, questions or concerns. Sullivan Lone MD [place] AAHIVMS [facility] Hematology/Oncology Physician [facility]  (Office):       [phone]  (Work cell):  [phone] (Fax):           [phone]  02/22/2019 8:59 PM   I, Scot Dock, am acting as a scribe for Dr. Sullivan Lone.   .I have reviewed the above documentation for accuracy and completeness, and I agree with the above. Brunetta Genera MD

## 2019-02-23 ENCOUNTER — Inpatient Hospital Stay (HOSPITAL_BASED_OUTPATIENT_CLINIC_OR_DEPARTMENT_OTHER): Payer: Managed Care, Other (non HMO) | Admitting: Hematology

## 2019-02-23 ENCOUNTER — Telehealth: Payer: Self-pay | Admitting: Hematology

## 2019-02-23 ENCOUNTER — Other Ambulatory Visit: Payer: Self-pay

## 2019-02-23 ENCOUNTER — Inpatient Hospital Stay: Payer: Managed Care, Other (non HMO)

## 2019-02-23 VITALS — BP 115/75 | HR 74 | Temp 98.3°F | Resp 17 | Ht 66.0 in | Wt 199.2 lb

## 2019-02-23 DIAGNOSIS — O99012 Anemia complicating pregnancy, second trimester: Secondary | ICD-10-CM

## 2019-02-23 DIAGNOSIS — D500 Iron deficiency anemia secondary to blood loss (chronic): Secondary | ICD-10-CM | POA: Diagnosis not present

## 2019-02-23 LAB — IRON AND TIBC
Iron: 26 ug/dL — ABNORMAL LOW (ref 41–142)
Saturation Ratios: 5 % — ABNORMAL LOW (ref 21–57)
TIBC: 484 ug/dL — ABNORMAL HIGH (ref 236–444)
UIBC: 457 ug/dL — ABNORMAL HIGH (ref 120–384)

## 2019-02-23 LAB — CMP (CANCER CENTER ONLY)
ALT: 9 U/L (ref 0–44)
AST: 13 U/L — ABNORMAL LOW (ref 15–41)
Albumin: 2.8 g/dL — ABNORMAL LOW (ref 3.5–5.0)
Alkaline Phosphatase: 114 U/L (ref 38–126)
Anion gap: 7 (ref 5–15)
BUN: 8 mg/dL (ref 6–20)
CO2: 23 mmol/L (ref 22–32)
Calcium: 8.5 mg/dL — ABNORMAL LOW (ref 8.9–10.3)
Chloride: 108 mmol/L (ref 98–111)
Creatinine: 0.62 mg/dL (ref 0.44–1.00)
GFR, Est AFR Am: >60 mL/min (ref >60)
GFR, Estimated: >60 mL/min (ref >60)
Glucose, Bld: 79 mg/dL (ref 70–99)
Potassium: 3.9 mmol/L (ref 3.5–5.1)
Sodium: 138 mmol/L (ref 135–145)
Total Bilirubin: 0.6 mg/dL (ref 0.3–1.2)
Total Protein: 6.7 g/dL (ref 6.5–8.1)

## 2019-02-23 LAB — CBC WITH DIFFERENTIAL (CANCER CENTER ONLY)
Abs Immature Granulocytes: 0.05 10*3/uL (ref 0.00–0.07)
Basophils Absolute: 0 10*3/uL (ref 0.0–0.1)
Basophils Relative: 1 %
Eosinophils Absolute: 0.1 10*3/uL (ref 0.0–0.5)
Eosinophils Relative: 2 %
HCT: 31.8 % — ABNORMAL LOW (ref 36.0–46.0)
Hemoglobin: 9.6 g/dL — ABNORMAL LOW (ref 12.0–15.0)
Immature Granulocytes: 1 %
Lymphocytes Relative: 22 %
Lymphs Abs: 1.9 10*3/uL (ref 0.7–4.0)
MCH: 23.2 pg — ABNORMAL LOW (ref 26.0–34.0)
MCHC: 30.2 g/dL (ref 30.0–36.0)
MCV: 76.8 fL — ABNORMAL LOW (ref 80.0–100.0)
Monocytes Absolute: 1 10*3/uL (ref 0.1–1.0)
Monocytes Relative: 12 %
Neutro Abs: 5.3 10*3/uL (ref 1.7–7.7)
Neutrophils Relative %: 62 %
Platelet Count: 262 10*3/uL (ref 150–400)
RBC: 4.14 MIL/uL (ref 3.87–5.11)
RDW: 29.2 % — ABNORMAL HIGH (ref 11.5–15.5)
WBC Count: 8.4 10*3/uL (ref 4.0–10.5)
nRBC: 0 % (ref 0.0–0.2)

## 2019-02-23 LAB — FERRITIN: Ferritin: 21 ng/mL (ref 11–307)

## 2019-02-23 NOTE — Telephone Encounter (Signed)
Per 10/15 los RTC with Dr Irene Limbo as needed

## 2019-02-28 ENCOUNTER — Other Ambulatory Visit: Payer: Self-pay

## 2019-02-28 ENCOUNTER — Ambulatory Visit (INDEPENDENT_AMBULATORY_CARE_PROVIDER_SITE_OTHER): Payer: Managed Care, Other (non HMO) | Admitting: Family Medicine

## 2019-02-28 ENCOUNTER — Encounter: Payer: Self-pay | Admitting: Family Medicine

## 2019-02-28 VITALS — BP 132/86 | HR 87 | Wt 198.0 lb

## 2019-02-28 DIAGNOSIS — R768 Other specified abnormal immunological findings in serum: Secondary | ICD-10-CM

## 2019-02-28 DIAGNOSIS — Z3483 Encounter for supervision of other normal pregnancy, third trimester: Secondary | ICD-10-CM

## 2019-02-28 DIAGNOSIS — Z3A35 35 weeks gestation of pregnancy: Secondary | ICD-10-CM

## 2019-02-28 DIAGNOSIS — Z3480 Encounter for supervision of other normal pregnancy, unspecified trimester: Secondary | ICD-10-CM

## 2019-02-28 NOTE — Progress Notes (Signed)
ROB   CC: None    

## 2019-02-28 NOTE — Progress Notes (Signed)
   PRENATAL VISIT NOTE  Subjective:  Katie Powell is a 28 y.o. G2P0010 at [redacted]w[redacted]d being seen today for ongoing prenatal care.  She is currently monitored for the following issues for this high-risk pregnancy and has Iron deficiency anemia due to chronic blood loss; Thrombocytosis ([place]); Supervision of other normal pregnancy, antepartum; and Red blood cell antibody positive, 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 on their problem list.  Patient reports no complaints.  Contractions: Irritability. Vag. Bleeding: None.  Movement: Present. Denies leaking of fluid.   The following portions of the patient's history were reviewed and updated as appropriate: allergies, current medications, past family history, past medical history, past social history, past surgical history and problem list.   Objective:   Vitals:   02/28/19 1104  BP: 132/86  Pulse: 87  Weight: 198 lb (89.8 kg)    Fetal Status:   Fundal Height: 37 cm Movement: Present     General:  Alert, oriented and cooperative. Patient is in no acute distress.  Skin: Skin is warm and dry. No rash noted.   Cardiovascular: Normal heart rate noted  Respiratory: Normal respiratory effort, no problems with respiration noted  Abdomen: Soft, gravid, appropriate for gestational age.  Pain/Pressure: Present     Pelvic: Cervical exam deferred        Extremities: Normal range of motion.  Edema: None  Mental Status: Normal mood and affect. Normal behavior. Normal judgment and thought content.   Assessment and Plan:  Pregnancy: G2P0010 at [redacted]w[redacted]d 1. Supervision of other normal pregnancy, antepartum Growth scan at 36-37 weeks; appt 11/6 RTC in 1 week for GBS and cervicovaginal swab   2. Red blood cell antibody positive, 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 Has completed 4 iron transfusions; cont PO iron Hgb 9.6 on 10/15; stable over last 2 weeks  Continue with PNV and iron Patient already went to [facility] to have blood drawn for type and screen  Preterm  labor symptoms and general obstetric precautions including but not limited to vaginal bleeding, contractions, leaking of fluid and fetal movement were reviewed in detail with the patient. Please refer to After Visit Summary for other counseling recommendations.   Return in about 1 week (around 03/07/2019) for [facility], in person .  Future Appointments  Date Time Provider [place]  03/08/2019  1:15 PM Woodroe Mode, MD [place] None  03/09/2019  2:50 PM Wendall Mola, NP PCP-PCP [facility]  03/17/2019 10:45 AM WH-MFC [place] 2 WH-MFCUS MFC-US    Chauncey Mann, MD

## 2019-03-02 ENCOUNTER — Encounter: Payer: Self-pay | Admitting: Obstetrics and Gynecology

## 2019-03-02 DIAGNOSIS — O361930 Maternal care for other isoimmunization, third trimester, not applicable or unspecified: Secondary | ICD-10-CM | POA: Insufficient documentation

## 2019-03-08 ENCOUNTER — Encounter: Payer: Self-pay | Admitting: Obstetrics & Gynecology

## 2019-03-08 ENCOUNTER — Other Ambulatory Visit: Payer: Self-pay

## 2019-03-08 ENCOUNTER — Ambulatory Visit (INDEPENDENT_AMBULATORY_CARE_PROVIDER_SITE_OTHER): Payer: Managed Care, Other (non HMO) | Admitting: Obstetrics & Gynecology

## 2019-03-08 ENCOUNTER — Other Ambulatory Visit (HOSPITAL_COMMUNITY)
Admission: RE | Admit: 2019-03-08 | Discharge: 2019-03-08 | Disposition: A | Discharge status: Home / Self Care | Payer: Managed Care, Other (non HMO) | Source: Ambulatory Visit | Attending: Obstetrics & Gynecology | Admitting: Obstetrics & Gynecology

## 2019-03-08 VITALS — BP 118/70 | HR 76 | Wt 201.0 lb

## 2019-03-08 DIAGNOSIS — Z3483 Encounter for supervision of other normal pregnancy, third trimester: Secondary | ICD-10-CM

## 2019-03-08 DIAGNOSIS — Z3480 Encounter for supervision of other normal pregnancy, unspecified trimester: Secondary | ICD-10-CM

## 2019-03-08 DIAGNOSIS — Z3A35 35 weeks gestation of pregnancy: Secondary | ICD-10-CM

## 2019-03-08 NOTE — Progress Notes (Signed)
ROB   CC: None    

## 2019-03-08 NOTE — Patient Instructions (Signed)

## 2019-03-08 NOTE — Progress Notes (Signed)
   PRENATAL VISIT NOTE  Subjective:  Katie Powell is a 28 y.o. G2P0010 at [redacted]w[redacted]d being seen today for ongoing prenatal care.  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  Patient reports no complaints.  Contractions: Irritability. Vag. Bleeding: None.  Movement: Present. Denies leaking of fluid.   The following portions of the patient's history were reviewed and updated as appropriate: allergies, current medications, past family history, past medical history, past social history, past surgical history and problem list.   Objective:   Vitals:   03/08/19 1330  BP: 118/70  Pulse: 76  Weight: 201 lb (91.2 kg)    Fetal Status: Fetal Heart Rate (bpm): 134 Fundal Height: 37 cm Movement: Present  Presentation: Vertex  General:  Alert, oriented and cooperative. Patient is in no acute distress.  Skin: Skin is warm and dry. No rash noted.   Cardiovascular: Normal heart rate noted  Respiratory: Normal respiratory effort, no problems with respiration noted  Abdomen: Soft, gravid, appropriate for gestational age.  Pain/Pressure: Present     Pelvic: Cervical exam performed Dilation: Closed Effacement (%): 10 Station: Ballotable  Extremities: Normal range of motion.  Edema: Trace  Mental Status: Normal mood and affect. Normal behavior. Normal judgment and thought content.   Assessment and Plan:  Pregnancy: G2P0010 at [redacted]w[redacted]d 1. Supervision of other normal pregnancy, antepartum Routine testing - Strep Gp B NAA - Cervicovaginal ancillary only( [place])  Preterm labor symptoms and general obstetric precautions including but not limited to vaginal bleeding, contractions,  leaking of fluid and fetal movement were reviewed in detail with the patient. Please refer to After Visit Summary for other counseling recommendations.   Return in about 1 week (around 03/15/2019).  Future Appointments  Date Time Provider [place]  03/09/2019  2:50 PM Wendall Mola, NP PCP-PCP [facility]  03/17/2019 10:45 AM WH-MFC [place] 2 WH-MFCUS MFC-US    Emeterio Reeve, MD

## 2019-03-09 ENCOUNTER — Encounter: Payer: Self-pay | Admitting: Adult Health Nurse Practitioner

## 2019-03-09 ENCOUNTER — Ambulatory Visit (INDEPENDENT_AMBULATORY_CARE_PROVIDER_SITE_OTHER): Payer: Managed Care, Other (non HMO) | Admitting: Adult Health Nurse Practitioner

## 2019-03-09 DIAGNOSIS — Z7689 Persons encountering health services in other specified circumstances: Secondary | ICD-10-CM

## 2019-03-09 HISTORY — DX: Persons encountering health services in other specified circumstances: Z76.89

## 2019-03-09 NOTE — Progress Notes (Signed)
New Patient Office Visit  Subjective:  Patient ID: Katie Powell, female    DOB: 1990-06-24  Age: 28 y.o. MRN: [id number]  CC:  Chief Complaint  Patient presents with  . New Patient (Initial Visit)    Pt wanted to get establish for after pregnancy.    HPI Katie Powell presents to establish care.  She is 9 months pregnant being followed by OB for high-risk pregnancy due to IDA and antibodies.  Reports she is a little nervous about delivery but is ready to have the baby.  Her boyfriend will be helping her out with their child.    PMH significant for IDA. She does take 1 iron pill a day.  Hx menorrhagia. Discussed post-partum return to menses.  She plans on breast feeding.  When her menses return, they can be even heavier.  Will discuss contraception following birth.   Past Medical History:  Diagnosis Date  . Anemia   . Anxiety   . Establishing care with new doctor, encounter for 03/09/2019    Past Surgical History:  Procedure Laterality Date  . EYE SURGERY    . FINGER SURGERY      History reviewed. No pertinent family history.  Social History   Socioeconomic History  . Marital status: Single    Spouse name: Not on file  . Number of children: Not on file  . Years of education: Not on file  . Highest education level: Not on file  Occupational History  . Not on file  Social Needs  . Financial resource strain: Not on file  . Food insecurity    Worry: Not on file    Inability: Not on file  . Transportation needs    Medical: Not on file    Non-medical: Not on file  Tobacco Use  . Smoking status: Current Every Day Smoker    Packs/day: 0.50    Types: Cigarettes  . Smokeless tobacco: Never Used  Substance and Sexual Activity  . Alcohol use: Not Currently  . Drug use: No  . Sexual activity: Yes    Birth control/protection: Injection, None  Lifestyle  . Physical activity    Days per week: Not on file    Minutes per session: Not on file  . Stress: Not on file   Relationships  . Social Herbalist on phone: Not on file    Gets together: Not on file    Attends religious service: Not on file    Active member of club or organization: Not on file    Attends meetings of clubs or organizations: Not on file    Relationship status: Not on file  . Intimate partner violence    Fear of current or ex partner: Not on file    Emotionally abused: Not on file    Physically abused: Not on file    Forced sexual activity: Not on file  Other Topics Concern  . Not on file  Social History Narrative  . Not on file    ROS Review of Systems Review of Systems See HPI Constitution: No fevers or chills No malaise No diaphoresis Skin: No rash or itching Eyes: no blurry vision, no double vision GU: no dysuria or hematuria Neuro: no dizziness or headaches  Objective:   Today's Vitals: BP 115/73   Pulse 85   Temp (!) 97 F (36.1 C)   Ht 5\' 6"  (1.676 m)   Wt 200 lb (90.7 kg)   LMP 06/24/2018  SpO2 97%   BMI 32.28 kg/m   Physical Exam    GEN: WDWN, NAD, Non-toxic, Alert & Oriented x 3 Visibly late stage pregnancy HEENT: Atraumatic, Normocephalic.  Ears and Nose: No external deformity. EXTR: No clubbing/cyanosis/edema NEURO: Normal gait.  PSYCH: Normally interactive. Conversant. Not depressed or anxious appearing.  Calm demeanor.    Assessment & Plan:   Problem List Items Addressed This Visit      Other   Establishing care with new doctor, encounter for      Outpatient Encounter Medications as of 03/09/2019  Medication Sig  . Elastic Bandages & Supports (COMFORT FIT MATERNITY SUPP LG) MISC 1 Units by Does not apply route daily.  . folic acid (FOLVITE) 1 MG tablet Take 1 tablet (1 mg total) by mouth daily.  . iron polysaccharides (NIFEREX) 150 MG capsule Take 1 capsule (150 mg total) by mouth 2 (two) times daily.  . polyethylene glycol (MIRALAX / GLYCOLAX) 17 g packet Take 17 g by mouth daily.  . Prenatal Vit w/Fe-Methylfol-FA  (PNV PO) Take by mouth.  . [DISCONTINUED] docusate sodium (COLACE) 100 MG capsule Take 1 capsule (100 mg total) by mouth 2 (two) times daily as needed for mild constipation or moderate constipation. (Patient not taking: Reported on 01/31/2019)  . [DISCONTINUED] Doxylamine-Pyridoxine (DICLEGIS) 10-10 MG TBEC Take 2 tabs at bedtime. If needed, add another tab in the morning. If needed, add another tab in the afternoon, up to 4 tabs/day. (Patient not taking: Reported on 01/31/2019)   No facility-administered encounter medications on file as of 03/09/2019.     Follow-up:  S/P term delivery.   Glyn Ade, NP

## 2019-03-09 NOTE — Patient Instructions (Signed)
° ° ° °  If you have lab work done today you will be contacted with your lab results within the next 2 weeks.  If you have not heard from us then please contact us. The fastest way to get your results is to register for My Chart. ° ° °IF you received an x-ray today, you will receive an invoice from [facility]. Please contact [facility] at [phone] with questions or concerns regarding your invoice.  ° °IF you received labwork today, you will receive an invoice from [facility]. Please contact [facility] at [phone] with questions or concerns regarding your invoice.  ° °Our billing staff will not be able to assist you with questions regarding bills from these companies. ° °You will be contacted with the lab results as soon as they are available. The fastest way to get your results is to activate your My Chart account. Instructions are located on the last page of this paperwork. If you have not heard from us regarding the results in 2 weeks, please contact this office. °  ° ° ° °

## 2019-03-10 LAB — CERVICOVAGINAL ANCILLARY ONLY
Chlamydia: NEGATIVE
Comment: NEGATIVE
Comment: NORMAL
Neisseria Gonorrhea: NEGATIVE

## 2019-03-10 LAB — STREP GP B NAA: Strep Gp B NAA: NEGATIVE

## 2019-03-15 ENCOUNTER — Ambulatory Visit (INDEPENDENT_AMBULATORY_CARE_PROVIDER_SITE_OTHER): Payer: Managed Care, Other (non HMO) | Admitting: Obstetrics and Gynecology

## 2019-03-15 ENCOUNTER — Other Ambulatory Visit: Payer: Self-pay

## 2019-03-15 ENCOUNTER — Encounter: Payer: Self-pay | Admitting: Obstetrics and Gynecology

## 2019-03-15 VITALS — BP 109/76 | HR 72 | Wt 203.0 lb

## 2019-03-15 DIAGNOSIS — O361930 Maternal care for other isoimmunization, third trimester, not applicable or unspecified: Secondary | ICD-10-CM

## 2019-03-15 DIAGNOSIS — R768 Other specified abnormal immunological findings in serum: Secondary | ICD-10-CM

## 2019-03-15 DIAGNOSIS — Z3A37 37 weeks gestation of pregnancy: Secondary | ICD-10-CM

## 2019-03-15 DIAGNOSIS — Z3480 Encounter for supervision of other normal pregnancy, unspecified trimester: Secondary | ICD-10-CM

## 2019-03-15 NOTE — Progress Notes (Signed)
   PRENATAL VISIT NOTE  Subjective:  Katie Powell is a 28 y.o. G2P0010 at [redacted]w[redacted]d being seen today for ongoing prenatal care.  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  Patient reports occasional contractions.  Contractions: Irritability. Vag. Bleeding: None.  Movement: Present. Denies leaking of fluid.   The following portions of the patient's history were reviewed and updated as appropriate: allergies, current medications, past family history, past medical history, past social history, past surgical history and problem list.   Objective:   Vitals:   03/15/19 0935  BP: 109/76  Pulse: 72  Weight: 203 lb (92.1 kg)    Fetal Status: Fetal Heart Rate (bpm): 145   Movement: Present     General:  Alert, oriented and cooperative. Patient is in no acute distress.  Skin: Skin is warm and dry. No rash noted.   Cardiovascular: Normal heart rate noted  Respiratory: Normal respiratory effort, no problems with respiration noted  Abdomen: Soft, gravid, appropriate for gestational age.  Pain/Pressure: Absent     Pelvic: Cervical exam deferred        Extremities: Normal range of motion.  Edema: Trace  Mental Status: Normal mood and affect. Normal behavior. Normal judgment and thought content.   Cephalic by palpation  Assessment and Plan:  Pregnancy: G2P0010 at [redacted]w[redacted]d  1. Supervision of other normal pregnancy, antepartum Reviewed induction, plans at 41 weeks  2. Red blood cell antibody positive, 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  3. 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 Please refer to After Visit Summary for other counseling recommendations.   Return in about 1 week (around 03/22/2019) for high OB, in person.  Future Appointments  Date Time Provider [place]  03/17/2019 10:45 AM WH-MFC [place] 2 WH-MFCUS MFC-US    Sloan Leiter, MD

## 2019-03-17 ENCOUNTER — Other Ambulatory Visit: Payer: Self-pay

## 2019-03-17 ENCOUNTER — Ambulatory Visit (HOSPITAL_COMMUNITY)
Admission: RE | Admit: 2019-03-17 | Discharge: 2019-03-17 | Disposition: A | Discharge status: Home / Self Care | Payer: Managed Care, Other (non HMO) | Source: Ambulatory Visit | Attending: Obstetrics | Admitting: Obstetrics

## 2019-03-17 DIAGNOSIS — O358XX0 Maternal care for other (suspected) fetal abnormality and damage, not applicable or unspecified: Secondary | ICD-10-CM | POA: Diagnosis not present

## 2019-03-17 DIAGNOSIS — O99333 Smoking (tobacco) complicating pregnancy, third trimester: Secondary | ICD-10-CM

## 2019-03-17 DIAGNOSIS — Z3480 Encounter for supervision of other normal pregnancy, unspecified trimester: Secondary | ICD-10-CM | POA: Diagnosis present

## 2019-03-17 DIAGNOSIS — R768 Other specified abnormal immunological findings in serum: Secondary | ICD-10-CM | POA: Insufficient documentation

## 2019-03-17 DIAGNOSIS — Z3A38 38 weeks gestation of pregnancy: Secondary | ICD-10-CM | POA: Diagnosis not present

## 2019-03-17 DIAGNOSIS — Z362 Encounter for other antenatal screening follow-up: Secondary | ICD-10-CM | POA: Diagnosis not present

## 2019-03-23 ENCOUNTER — Encounter: Payer: Self-pay | Admitting: Obstetrics and Gynecology

## 2019-03-23 ENCOUNTER — Ambulatory Visit (INDEPENDENT_AMBULATORY_CARE_PROVIDER_SITE_OTHER): Payer: Managed Care, Other (non HMO) | Admitting: Obstetrics and Gynecology

## 2019-03-23 ENCOUNTER — Other Ambulatory Visit: Payer: Self-pay

## 2019-03-23 VITALS — BP 122/83 | HR 86 | Wt 207.5 lb

## 2019-03-23 DIAGNOSIS — Z3A38 38 weeks gestation of pregnancy: Secondary | ICD-10-CM

## 2019-03-23 DIAGNOSIS — O99013 Anemia complicating pregnancy, third trimester: Secondary | ICD-10-CM

## 2019-03-23 DIAGNOSIS — Z3480 Encounter for supervision of other normal pregnancy, unspecified trimester: Secondary | ICD-10-CM

## 2019-03-23 DIAGNOSIS — R768 Other specified abnormal immunological findings in serum: Secondary | ICD-10-CM

## 2019-03-23 DIAGNOSIS — D500 Iron deficiency anemia secondary to blood loss (chronic): Secondary | ICD-10-CM

## 2019-03-23 NOTE — Progress Notes (Signed)
   PRENATAL VISIT NOTE  Subjective:  Katie Powell is a 28 y.o. G2P0010 at [redacted]w[redacted]d being seen today for ongoing prenatal care.  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  Patient reports no complaints.  Contractions: Irritability. Vag. Bleeding: Bloody Show.  Movement: Present. Denies leaking of fluid.   The following portions of the patient's history were reviewed and updated as appropriate: allergies, current medications, past family history, past medical history, past social history, past surgical history and problem list.   Objective:   Vitals:   03/23/19 1055  BP: 122/83  Pulse: 86  Weight: 207 lb 8 oz (94.1 kg)    Fetal Status: Fetal Heart Rate (bpm): 140 Fundal Height: 38 cm Movement: Present     General:  Alert, oriented and cooperative. Patient is in no acute distress.  Skin: Skin is warm and dry. No rash noted.   Cardiovascular: Normal heart rate noted  Respiratory: Normal respiratory effort, no problems with respiration noted  Abdomen: Soft, gravid, appropriate for gestational age.  Pain/Pressure: Absent     Pelvic: Cervical exam deferred        Extremities: Normal range of motion.  Edema: Trace  Mental Status: Normal mood and affect. Normal behavior. Normal judgment and thought content.   Assessment and Plan:  Pregnancy: G2P0010 at [redacted]w[redacted]d 1. Supervision of other normal pregnancy, antepartum Patient is doing well without complaints IOL scheduled at 41 weeks  2. Red blood cell antibody positive, 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 Will inform blood bank upon admission  3. Iron deficiency anemia  due to chronic blood loss   Term labor symptoms and general obstetric precautions including but not limited to vaginal bleeding, contractions, leaking of fluid and fetal movement were reviewed in detail with the patient. Please refer to After Visit Summary for other counseling recommendations.   Return in about 1 year (around 03/22/2020) for Virtual, ROB, Low risk.  No future appointments.  Mora Bellman, MD

## 2019-03-23 NOTE — Progress Notes (Signed)
Pt is here for ROB. [redacted]w[redacted]d. Pt reports very light pink spotting when wiping this morning, denies any contractions, she reports good fetal movement. Pt denies any recent intercourse.

## 2019-03-23 NOTE — Addendum Note (Signed)
Addended by: Mora Bellman on: 03/23/2019 11:31 AM   Modules accepted: Orders, SmartSet

## 2019-03-25 ENCOUNTER — Inpatient Hospital Stay (HOSPITAL_COMMUNITY)
Admission: AD | Admit: 2019-03-25 | Discharge: 2019-03-27 | DRG: 806 | Disposition: A | Discharge status: Home / Self Care | Payer: Managed Care, Other (non HMO) | Attending: Obstetrics and Gynecology | Admitting: Obstetrics and Gynecology

## 2019-03-25 ENCOUNTER — Encounter (HOSPITAL_COMMUNITY): Payer: Self-pay | Admitting: *Deleted

## 2019-03-25 ENCOUNTER — Inpatient Hospital Stay (HOSPITAL_COMMUNITY): Payer: Managed Care, Other (non HMO) | Admitting: Anesthesiology

## 2019-03-25 ENCOUNTER — Other Ambulatory Visit: Payer: Self-pay

## 2019-03-25 DIAGNOSIS — O9902 Anemia complicating childbirth: Secondary | ICD-10-CM | POA: Diagnosis present

## 2019-03-25 DIAGNOSIS — Z20828 Contact with and (suspected) exposure to other viral communicable diseases: Secondary | ICD-10-CM | POA: Diagnosis present

## 2019-03-25 DIAGNOSIS — O368390 Maternal care for abnormalities of the fetal heart rate or rhythm, unspecified trimester, not applicable or unspecified: Secondary | ICD-10-CM | POA: Diagnosis present

## 2019-03-25 DIAGNOSIS — Z3A39 39 weeks gestation of pregnancy: Secondary | ICD-10-CM | POA: Diagnosis not present

## 2019-03-25 DIAGNOSIS — D500 Iron deficiency anemia secondary to blood loss (chronic): Secondary | ICD-10-CM | POA: Diagnosis present

## 2019-03-25 DIAGNOSIS — Z87891 Personal history of nicotine dependence: Secondary | ICD-10-CM | POA: Diagnosis not present

## 2019-03-25 DIAGNOSIS — Z3490 Encounter for supervision of normal pregnancy, unspecified, unspecified trimester: Secondary | ICD-10-CM | POA: Diagnosis present

## 2019-03-25 LAB — PREPARE RBC (CROSSMATCH)

## 2019-03-25 LAB — CBC
HCT: 34.6 % — ABNORMAL LOW (ref 36.0–46.0)
Hemoglobin: 10.8 g/dL — ABNORMAL LOW (ref 12.0–15.0)
MCH: 24.7 pg — ABNORMAL LOW (ref 26.0–34.0)
MCHC: 31.2 g/dL (ref 30.0–36.0)
MCV: 79.2 fL — ABNORMAL LOW (ref 80.0–100.0)
Platelets: 250 10*3/uL (ref 150–400)
RBC: 4.37 MIL/uL (ref 3.87–5.11)
RDW: 25.5 % — ABNORMAL HIGH (ref 11.5–15.5)
WBC: 10.4 10*3/uL (ref 4.0–10.5)
nRBC: 0 % (ref 0.0–0.2)

## 2019-03-25 LAB — SARS CORONAVIRUS 2 (TAT 6-24 HRS): SARS Coronavirus 2: NEGATIVE

## 2019-03-25 LAB — RPR: RPR Ser Ql: NONREACTIVE

## 2019-03-25 MED ORDER — OXYCODONE-ACETAMINOPHEN 5-325 MG PO TABS: 1.0000 | ORAL_TABLET | ORAL | Status: DC | PRN

## 2019-03-25 MED ORDER — ONDANSETRON HCL 4 MG/2ML IJ SOLN: 4.0000 mg | Freq: Four times a day (QID) | INTRAMUSCULAR | Status: DC | PRN

## 2019-03-25 MED ORDER — OXYCODONE-ACETAMINOPHEN 5-325 MG PO TABS: 2.0000 | ORAL_TABLET | ORAL | Status: DC | PRN

## 2019-03-25 MED ORDER — EPHEDRINE 5 MG/ML INJ: 10.0000 mg | INTRAVENOUS | Status: DC | PRN

## 2019-03-25 MED ORDER — FLEET ENEMA 7-19 GM/118ML RE ENEM: 1.0000 | ENEMA | RECTAL | Status: DC | PRN

## 2019-03-25 MED ORDER — OXYTOCIN 40 UNITS IN NORMAL SALINE INFUSION - SIMPLE MED: 2.5000 [IU]/h | INTRAVENOUS | Status: DC

## 2019-03-25 MED ORDER — LACTATED RINGERS IV SOLN
INTRAVENOUS | Status: DC
  Administered 2019-03-25 (×2): via INTRAVENOUS

## 2019-03-25 MED ORDER — DIPHENHYDRAMINE HCL 25 MG PO CAPS: 25.0000 mg | ORAL_CAPSULE | Freq: Four times a day (QID) | ORAL | Status: DC | PRN

## 2019-03-25 MED ORDER — FENTANYL CITRATE (PF) 100 MCG/2ML IJ SOLN: 100.0000 ug | INTRAMUSCULAR | Status: DC | PRN

## 2019-03-25 MED ORDER — LACTATED RINGERS IV SOLN: 500.0000 mL | Freq: Once | INTRAVENOUS | Status: DC

## 2019-03-25 MED ORDER — DIBUCAINE (PERIANAL) 1 % EX OINT: 1.0000 "application " | TOPICAL_OINTMENT | CUTANEOUS | Status: DC | PRN

## 2019-03-25 MED ORDER — ONDANSETRON HCL 4 MG PO TABS: 4.0000 mg | ORAL_TABLET | ORAL | Status: DC | PRN

## 2019-03-25 MED ORDER — WITCH HAZEL-GLYCERIN EX PADS: 1.0000 "application " | MEDICATED_PAD | CUTANEOUS | Status: DC | PRN

## 2019-03-25 MED ORDER — LACTATED RINGERS IV SOLN: 500.0000 mL | INTRAVENOUS | Status: DC | PRN

## 2019-03-25 MED ORDER — OXYTOCIN BOLUS FROM INFUSION
500.0000 mL | Freq: Once | INTRAVENOUS | Status: AC
  Administered 2019-03-25: 500 mL via INTRAVENOUS

## 2019-03-25 MED ORDER — SOD CITRATE-CITRIC ACID 500-334 MG/5ML PO SOLN: 30.0000 mL | ORAL | Status: DC | PRN

## 2019-03-25 MED ORDER — TERBUTALINE SULFATE 1 MG/ML IJ SOLN: 0.2500 mg | Freq: Once | INTRAMUSCULAR | Status: DC | PRN

## 2019-03-25 MED ORDER — COCONUT OIL OIL: 1.0000 "application " | TOPICAL_OIL | Status: DC | PRN

## 2019-03-25 MED ORDER — BENZOCAINE-MENTHOL 20-0.5 % EX AERO
1.0000 "application " | INHALATION_SPRAY | CUTANEOUS | Status: DC | PRN
  Administered 2019-03-25 – 2019-03-26 (×3): 1 via TOPICAL
  Filled 2019-03-25 (×3): qty 56

## 2019-03-25 MED ORDER — LIDOCAINE HCL (PF) 1 % IJ SOLN: 30.0000 mL | INTRAMUSCULAR | Status: DC | PRN

## 2019-03-25 MED ORDER — SENNOSIDES-DOCUSATE SODIUM 8.6-50 MG PO TABS
2.0000 | ORAL_TABLET | ORAL | Status: DC
  Filled 2019-03-25: qty 2

## 2019-03-25 MED ORDER — ACETAMINOPHEN 325 MG PO TABS: 650.0000 mg | ORAL_TABLET | ORAL | Status: DC | PRN

## 2019-03-25 MED ORDER — IBUPROFEN 600 MG PO TABS
600.0000 mg | ORAL_TABLET | Freq: Four times a day (QID) | ORAL | Status: DC
  Administered 2019-03-25 – 2019-03-27 (×5): 600 mg via ORAL
  Filled 2019-03-25 (×6): qty 1

## 2019-03-25 MED ORDER — ONDANSETRON HCL 4 MG/2ML IJ SOLN: 4.0000 mg | INTRAMUSCULAR | Status: DC | PRN

## 2019-03-25 MED ORDER — OXYTOCIN 40 UNITS IN NORMAL SALINE INFUSION - SIMPLE MED
1.0000 m[IU]/min | INTRAVENOUS | Status: DC
  Administered 2019-03-25: 2 m[IU]/min via INTRAVENOUS
  Filled 2019-03-25: qty 1000

## 2019-03-25 MED ORDER — SODIUM CHLORIDE (PF) 0.9 % IJ SOLN
INTRAMUSCULAR | Status: DC | PRN
  Administered 2019-03-25: 12 mL/h via EPIDURAL

## 2019-03-25 MED ORDER — PHENYLEPHRINE 40 MCG/ML (10ML) SYRINGE FOR IV PUSH (FOR BLOOD PRESSURE SUPPORT)
80.0000 ug | PREFILLED_SYRINGE | INTRAVENOUS | Status: DC | PRN
  Filled 2019-03-25: qty 10

## 2019-03-25 MED ORDER — LACTATED RINGERS IV SOLN: INTRAVENOUS | Status: DC

## 2019-03-25 MED ORDER — ZOLPIDEM TARTRATE 5 MG PO TABS: 5.0000 mg | ORAL_TABLET | Freq: Every evening | ORAL | Status: DC | PRN

## 2019-03-25 MED ORDER — LIDOCAINE HCL (PF) 1 % IJ SOLN
INTRAMUSCULAR | Status: DC | PRN
  Administered 2019-03-25: 11 mL via EPIDURAL

## 2019-03-25 MED ORDER — FENTANYL-BUPIVACAINE-NACL 0.5-0.125-0.9 MG/250ML-% EP SOLN
12.0000 mL/h | EPIDURAL | Status: DC | PRN
  Filled 2019-03-25: qty 250

## 2019-03-25 MED ORDER — PHENYLEPHRINE 40 MCG/ML (10ML) SYRINGE FOR IV PUSH (FOR BLOOD PRESSURE SUPPORT): 80.0000 ug | PREFILLED_SYRINGE | INTRAVENOUS | Status: DC | PRN

## 2019-03-25 MED ORDER — DIPHENHYDRAMINE HCL 50 MG/ML IJ SOLN: 12.5000 mg | INTRAMUSCULAR | Status: DC | PRN

## 2019-03-25 MED ORDER — OXYTOCIN BOLUS FROM INFUSION: 500.0000 mL | Freq: Once | INTRAVENOUS | Status: DC

## 2019-03-25 MED ORDER — SIMETHICONE 80 MG PO CHEW: 80.0000 mg | CHEWABLE_TABLET | ORAL | Status: DC | PRN

## 2019-03-25 MED ORDER — ACETAMINOPHEN 325 MG PO TABS
650.0000 mg | ORAL_TABLET | ORAL | Status: DC | PRN
  Administered 2019-03-26 (×2): 650 mg via ORAL
  Filled 2019-03-25 (×2): qty 2

## 2019-03-25 MED ORDER — TETANUS-DIPHTH-ACELL PERTUSSIS 5-2.5-18.5 LF-MCG/0.5 IM SUSP: 0.5000 mL | Freq: Once | INTRAMUSCULAR | Status: DC

## 2019-03-25 MED ORDER — PRENATAL MULTIVITAMIN CH
1.0000 | ORAL_TABLET | Freq: Every day | ORAL | Status: DC
  Administered 2019-03-26: 1 via ORAL
  Filled 2019-03-25: qty 1

## 2019-03-25 NOTE — H&P (Signed)
OBSTETRIC ADMISSION HISTORY AND PHYSICAL  Katie Powell is a 28 y.o. female G2P0010 with IUP at [redacted]w[redacted]d by LMP presenting for earlt labor with FHR variables. She reports +FMs, No LOF, no VB, no blurry vision, headaches or peripheral edema, and RUQ pain.  She plans on breast and bottle feeding. She request POPs for birth control. She received her prenatal care at [place]: By LMP --->  Estimated Date of Delivery: 03/31/19   Nursing Staff Provider  Office Location  [place]  Dating  LMP   Language  ENGLISH Anatomy US  wnl with EICF, normal NIPS, f/u growth in 4-6 weeks   Flu Vaccine  Declined 12/30/18 Genetic Screen  NIPS:  Low risk female  AFP:    TDaP vaccine  Given 12/30/18 Hgb A1C or  GTT Early  Third trimester 82/77/72  Rhogam     LAB RESULTS   Feeding Plan BOTH  Blood Type --/--/O POS (09/10 1230)   Contraception PILL Antibody POS (09/10 1230)  Circumcision YES IF BOY  Rubella 7.03 (04/28 1427)  Pediatrician  [facility] RPR Non Reactive (08/21 0958)   Support Person SISTER  HBsAg Negative (07/15 0937)   Prenatal Classes  HIV Non Reactive (08/21 0958)  BTL Consent  GBS  (For PCN allergy, check sensitivities)   VBAC Consent  Pap  09/06/2018- negative    Hgb Electro  Neg horizon    CF Neg Horizon    SMA Neg Horizon    Waterbirth  [ ]  Class [ ]  Consent [ ]  CNM visit      Prenatal History/Complications:  Past Medical History: Past Medical History:  Diagnosis Date  . Anemia   . Anxiety   . Establishing care with new doctor, encounter for 03/09/2019    Past Surgical History: Past Surgical History:  Procedure Laterality Date  . EYE SURGERY    . FINGER SURGERY      Obstetrical History: OB History    Gravida  2   Para      Term      Preterm      AB  1   Living        SAB  1   TAB  0   Ectopic      Multiple      Live Births              Social History Social History   Socioeconomic History  . Marital status: Single    Spouse name: Not on  file  . Number of children: Not on file  . Years of education: Not on file  . Highest education level: Not on file  Occupational History  . Not on file  Social Needs  . Financial resource strain: Not on file  . Food insecurity    Worry: Not on file    Inability: Not on file  . Transportation needs    Medical: Not on file    Non-medical: Not on file  Tobacco Use  . Smoking status: Former Smoker    Packs/day: 0.50    Types: Cigarettes  . Smokeless tobacco: Never Used  Substance and Sexual Activity  . Alcohol use: Not Currently  . Drug use: No  . Sexual activity: Yes    Birth control/protection: Injection, None  Lifestyle  . Physical activity    Days per week: Not on file    Minutes per session: Not on file  . Stress: Not on file  Relationships  . Social  connections    Talks on phone: Not on file    Gets together: Not on file    Attends religious service: Not on file    Active member of club or organization: Not on file    Attends meetings of clubs or organizations: Not on file    Relationship status: Not on file  Other Topics Concern  . Not on file  Social History Narrative  . Not on file    Family History: History reviewed. No pertinent family history.  Allergies: No Known Allergies  Medications Prior to Admission  Medication Sig Dispense Refill Last Dose  . iron polysaccharides (NIFEREX) 150 MG capsule Take 1 capsule (150 mg total) by mouth 2 (two) times daily. 60 capsule 5 03/24/2019 at Unknown time  . Prenatal Vit w/Fe-Methylfol-FA (PNV PO) Take by mouth.   03/24/2019 at Unknown time  . Elastic Bandages & Supports (COMFORT FIT MATERNITY SUPP LG) MISC 1 Units by Does not apply route daily. 1 each 0 Unknown at Unknown time  . folic acid (FOLVITE) 1 MG tablet Take 1 tablet (1 mg total) by mouth daily.   Unknown at Unknown time  . polyethylene glycol (MIRALAX / GLYCOLAX) 17 g packet Take 17 g by mouth daily.   Unknown at Unknown time     Review of Systems    All systems reviewed and negative except as stated in HPI  Blood pressure 121/89, pulse 100, temperature 98 F (36.7 C), resp. rate 19, weight 94 kg, last menstrual period 06/24/2018, unknown if currently breastfeeding. General appearance: alert, cooperative and mild distress Lungs: clear to auscultation bilaterally Heart: regular rate and rhythm Abdomen: soft, non-tender; bowel sounds normal Pelvic: n/a Extremities: Homans sign is negative, no sign of DVT DTR's +2 Presentation: cephalic Fetal monitoringBaseline: 125 bpm, Variability: Good {> 6 bpm), Accelerations: Reactive and Decelerations: Absent Uterine activityFrequency: Every 3 minutes Dilation: 2.5 Effacement (%): 60 Station: -3 Exam by:: Erasmo Score RN   Prenatal labs: ABO, Rh: --/--/O POS (09/10 1230) Antibody: POS (09/10 1230) Rubella: 7.03 (04/28 1427) RPR: Non Reactive (08/21 0958)  HBsAg: Negative (07/15 0937)  HIV: Non Reactive (08/21 0958)  GBS: --[place] (10/28 0227)   Prenatal Transfer Tool  Maternal Diabetes: No Genetic Screening: Normal Maternal Ultrasounds/Referrals: Normal Fetal Ultrasounds or other Referrals:  Referred to Materal Fetal Medicine  Maternal Substance Abuse:  No Significant Maternal Medications:  None Significant Maternal Lab Results: Group B Strep negative  No results found for this or any previous visit (from the past 24 hour(s)).  Patient Active Problem List   Diagnosis Date Noted  . Establishing care with new doctor, encounter for 03/09/2019  . 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 Supervision of other normal pregnancy, antepartum 09/06/2018  . Iron deficiency anemia due to chronic blood loss 08/20/2016  . Thrombocytosis ([place]) 08/20/2016    Assessment/Plan:  Katie Powell is a 28 y.o. G2P0010 at [redacted]w[redacted]d here for early labor with repeated FHR  variables  #Labor: expectant management at this time, plan pitocin if no change at next exam #Pain: Planning epidural #FWB: Cat 1 upon admission #ID:  GBS negative #MOF: both #MOC: POP #Circ:  [place], CNM  03/25/2019, 8:37 AM

## 2019-03-25 NOTE — Plan of Care (Signed)
  Problem: Education: Goal: Knowledge of General Education information will improve Description: Including pain rating scale, medication(s)/side effects and non-pharmacologic comfort measures Outcome: Progressing   Problem: Pain Management: Goal: Relief or control of pain from uterine contractions will improve Outcome: Progressing

## 2019-03-25 NOTE — Progress Notes (Signed)
Labor Progress Note Katie Powell is a 28 y.o. G2P0010 at [redacted]w[redacted]d presented for IOL for NRFHT  S:  Patient comfortable.  O:  BP 122/71   Pulse 96   Temp 98.4 F (36.9 C) (Axillary)   Resp 20   Ht 5\' 6"  (1.676 m)   Wt 94 kg   LMP 06/24/2018   BMI 33.43 kg/m   Fetal Tracing:  Baseline: 130 Variability: moderate Accels: 15x15 Decels: none   Toco: 2-3   CVE: Dilation: 7.5 Effacement (%): 100 Cervical Position: Middle Station: 0 Presentation: Vertex Exam by:: A. Durene Romans, RN   A&P: 28 y.o. G2P0010 [redacted]w[redacted]d IOL for NRFHT #Labor: Progressing well. Will continue pitocin #Pain: epidural #FWB: Cat 1 #GBS negative  Wende Mott, CNM 6:27 PM

## 2019-03-25 NOTE — MAU Note (Signed)
CTX less than 5 minutes apart.  No LOF.  Some bloody-tinged mucous.  + FM.  Receives iron infusions for anemia, no other complications w/ pregnancy.

## 2019-03-25 NOTE — Anesthesia Procedure Notes (Signed)
Epidural Patient location during procedure: OB Start time: 03/25/2019 10:08 AM End time: 03/25/2019 10:21 AM  Staffing Anesthesiologist: Lynda Rainwater, MD Performed: anesthesiologist   Preanesthetic Checklist Completed: patient identified, site marked, surgical consent, pre-op evaluation, timeout performed, IV checked, risks and benefits discussed and monitors and equipment checked  Epidural Patient position: sitting Prep: ChloraPrep Patient monitoring: heart rate, cardiac monitor, continuous pulse ox and blood pressure Approach: midline Location: L2-L3 Injection technique: LOR saline  Needle:  Needle type: Tuohy  Needle gauge: 17 G Needle length: 9 cm Needle insertion depth: 6 cm Catheter type: closed end flexible Catheter size: 20 Guage Catheter at skin depth: 10 cm Test dose: negative  Assessment Events: blood not aspirated, injection not painful, no injection resistance, negative IV test and no paresthesia  Additional Notes Reason for block:procedure for pain

## 2019-03-25 NOTE — Anesthesia Preprocedure Evaluation (Signed)
Anesthesia Evaluation  Patient identified by MRN, date of birth, ID band Patient awake    Reviewed: Allergy & Precautions, NPO status , Patient's Chart, lab work & pertinent test results  Airway Mallampati: II  TM Distance: >3 FB Neck ROM: Full    Dental no notable dental hx.    Pulmonary neg pulmonary ROS, former smoker,    Pulmonary exam normal breath sounds clear to auscultation       Cardiovascular negative cardio ROS Normal cardiovascular exam Rhythm:Regular Rate:Normal     Neuro/Psych Anxiety negative neurological ROS  negative psych ROS   GI/Hepatic negative GI ROS, Neg liver ROS,   Endo/Other  negative endocrine ROS  Renal/GU negative Renal ROS  negative genitourinary   Musculoskeletal negative musculoskeletal ROS (+)   Abdominal   Peds negative pediatric ROS (+)  Hematology negative hematology ROS (+)   Anesthesia Other Findings   Reproductive/Obstetrics (+) Pregnancy                             Anesthesia Physical Anesthesia Plan  ASA: II  Anesthesia Plan: Epidural   Post-op Pain Management:    Induction:   PONV Risk Score and Plan:   Airway Management Planned:   Additional Equipment:   Intra-op Plan:   Post-operative Plan:   Informed Consent:   Plan Discussed with:   Anesthesia Plan Comments:         Anesthesia Quick Evaluation

## 2019-03-25 NOTE — Progress Notes (Signed)
Labor Progress Note Katie Powell is a 28 y.o. G2P0010 at [redacted]w[redacted]d presented for IOL for NRFHT  S:  Patient comfortable with epidural  O:  BP 122/75   Pulse 67   Temp 98.4 F (36.9 C) (Axillary)   Resp 20   Wt 94 kg   LMP 06/24/2018   BMI 33.43 kg/m   Fetal Tracing:  Baseline: 130 Variability: moderate Accels: 15x15 Decels: early  Toco: 2-3   CVE: Dilation: 4 Effacement (%): 100 Cervical Position: Posterior Station: -1 Presentation: Vertex Exam by:: Haynes Bast, CNM   A&P: 28 y.o. G2P0010 [redacted]w[redacted]d IOL NRFHT  #Labor: Progressing well. Discussed with patient risks and benefits of AROM for augmentation of labor. Patient agreeable to plan of care. AROM with moderate amount of clear fluid. Patient and FHR tolerated procedure well. Will recheck in 2-3 hours and if unchanged, start pitocin #Pain: epidural #FWB: now Cat 1 #GBS negative   Wende Mott, CNM 11:59 AM

## 2019-03-25 NOTE — Progress Notes (Signed)
Labor Progress Note Katie Powell is a 28 y.o. G2P0010 at [redacted]w[redacted]d presented for IOL for NRFHT  S:  Patient comfortable with epidural  O:  BP 122/75   Pulse 67   Temp 98.4 F (36.9 C) (Axillary)   Resp 20   Wt 94 kg   LMP 06/24/2018   BMI 33.43 kg/m   Fetal Tracing:  Baseline: 130 Variability: moderate Accels: 15x15 Decels: early  Toco: 2-4   CVE: Dilation: 4.5 Effacement (%): 100 Cervical Position: Middle Station: -1 Presentation: Vertex Exam by:: A, Tuttle, RN   A&P: 28 y.o. G2P0010 [redacted]w[redacted]d IOL NRFHT #Labor: Cervix unchanged. Will start pitocin 2x2 #Pain: epidural #FWB: Cat 1 #GBS negative  Wende Mott, CNM 3:08 PM

## 2019-03-25 NOTE — Discharge Summary (Signed)
Postpartum Discharge Summary     Patient Name: Katie Powell DOB: 1991-04-17 MRN: [id number]  Date of admission: 03/25/2019 Delivering Provider: Wende Mott   Date of discharge: 03/27/2019  Admitting diagnosis: 32 wks ctx Intrauterine pregnancy: [redacted]w[redacted]d    Secondary diagnosis:  Principal Problem:   Normal vaginal delivery Active Problems:   Iron deficiency anemia due to chronic blood loss   Variable fetal heart rate decelerations, antepartum   Encounter for induction of labor   Obstetrical laceration  Additional problems: None     Discharge diagnosis: Term Pregnancy Delivered                                                                                                Post partum procedures:None  Augmentation: AROM and Pitocin  Complications: None  Hospital course:  Induction of Labor With Vaginal Delivery   28y.o. yo G2P0010 at 34w1das admitted to the hospital 03/25/2019 for induction of labor.  Indication for induction: NRFHT.  Patient had an uncomplicated labor course as follows: Membrane Rupture Time/Date: 11:21 AM ,03/25/2019   Intrapartum Procedures: Episiotomy: None [1]                                         Lacerations:  Sulcus [9]  Patient had delivery of a Viable infant.  Information for the patient's newborn:  WhYasemin, Rabon0[[id number]]Delivery Method: Vag-Spont    03/25/2019  Details of delivery can be found in separate delivery note.  Patient had a routine postpartum course. Patient is discharged home 03/27/19. Delivery time: 7:47 PM    Magnesium Sulfate received: No BMZ received: No Rhophylac:N/A MMR:N/A Transfusion:No  Physical exam  Vitals:   03/26/19 1030 03/26/19 1510 03/26/19 2242 03/27/19 0609  BP: 112/67 115/74 109/67 120/86  Pulse: 70 84 78 80  Resp: '17 18 18 18  '$ Temp: 98.3 F (36.8 C) 98 F (36.7 C) 98.3 F (36.8 C) 98.4 F (36.9 C)  TempSrc: Oral Oral Oral Oral  SpO2:  100% 100% 100%  Weight:      Height:        General: alert, cooperative and no distress  Chest: HRRR, CTA Lochia: appropriate Uterine Fundus: firm at U/-3 Incision: N/A DVT Evaluation: No evidence of DVT seen on physical exam. No significant calf/ankle edema. Labs: Lab Results  Component Value Date   WBC 14.4 (H) 03/26/2019   HGB 8.7 (L) 03/26/2019   HCT 27.6 (L) 03/26/2019   MCV 79.3 (L) 03/26/2019   PLT 231 03/26/2019   CMP Latest Ref Rng & Units 02/23/2019  Glucose 70 - 99 mg/dL 79  BUN 6 - 20 mg/dL 8  Creatinine 0.44 - 1.00 mg/dL 0.62  Sodium 135 - 145 mmol/L 138  Potassium 3.5 - 5.1 mmol/L 3.9  Chloride 98 - 111 mmol/L 108  CO2 22 - 32 mmol/L 23  Calcium 8.9 - 10.3 mg/dL 8.5(L)  Total Protein 6.5 - 8.1 g/dL 6.7  Total Bilirubin 0.3 - 1.2 mg/dL  0.6  Alkaline Phos 38 - 126 U/L 114  AST 15 - 41 U/L 13(L)  ALT 0 - 44 U/L 9    Discharge instruction: per After Visit Summary and "Baby and Me Booklet". Pain Management, Peri-Care, Breastfeeding, Who and When to call for postpartum complications. Information Sheet(s) given Care after Vaginal Delivery, Postpartum Baby Blues   After visit meds:  Allergies as of 03/27/2019   No Known Allergies     Medication List    STOP taking these medications   Comfort Fit Maternity Supp Lg Misc   folic acid 1 MG tablet Commonly known as: FOLVITE     TAKE these medications   ibuprofen 600 MG tablet Commonly known as: ADVIL Take 1 tablet (600 mg total) by mouth every 6 (six) hours.   iron polysaccharides 150 MG capsule Commonly known as: NIFEREX Take 1 capsule (150 mg total) by mouth 2 (two) times daily.   PNV PO Take by mouth.   polyethylene glycol 17 g packet Commonly known as: MIRALAX / GLYCOLAX Take 17 g by mouth daily.       Diet: routine diet  Activity: Advance as tolerated. Pelvic rest for 6 weeks.   Outpatient follow up:4 weeks Follow up Appt: Future Appointments  Date Time Provider [place]  03/30/2019  3:45 PM Sloan Leiter, MD  New Site None   Follow up Visit:  Please schedule this patient for Postpartum visit in: 4 weeks with the following provider: Any provider For C/S patients schedule nurse incision check in weeks 2 weeks: no Low risk pregnancy complicated by: n/a Delivery mode:  SVD Anticipated Birth Control:  POPs PP Procedures needed: n/a  Schedule Integrated [place] visit: no   Newborn Data: Live born female-Raelyn Birth Weight:   APGAR: 42, 9  Newborn Delivery   Birth date/time: 03/25/2019 19:47:00 Delivery type: Vaginal, Spontaneous      Baby Feeding: Bottle and Breast Disposition:home with mother

## 2019-03-26 LAB — CBC
HCT: 27.6 % — ABNORMAL LOW (ref 36.0–46.0)
Hemoglobin: 8.7 g/dL — ABNORMAL LOW (ref 12.0–15.0)
MCH: 25 pg — ABNORMAL LOW (ref 26.0–34.0)
MCHC: 31.5 g/dL (ref 30.0–36.0)
MCV: 79.3 fL — ABNORMAL LOW (ref 80.0–100.0)
Platelets: 231 10*3/uL (ref 150–400)
RBC: 3.48 MIL/uL — ABNORMAL LOW (ref 3.87–5.11)
RDW: 25.2 % — ABNORMAL HIGH (ref 11.5–15.5)
WBC: 14.4 10*3/uL — ABNORMAL HIGH (ref 4.0–10.5)
nRBC: 0 % (ref 0.0–0.2)

## 2019-03-26 NOTE — Lactation Note (Signed)
This note was copied from a baby's chart. Lactation Consultation Note  Patient Name: Girl Sadiya Cuttino [id number] Date: 03/26/2019 Reason for consult: Initial assessment;1st time breastfeeding;Term P1, 4 hour female infant. Per mom, she has medela DEBP at home. Per mom, infant latched 8 minutes in L&D. This is mom's 2nd time latching infant to breast.  LC entered the  room infant was cuing to breastfeed, LC discussed with mom how to identify hunger cues, sheet given. Mom latched infant on right breast using cross cradle hold position, infant latched wide mouth, tongue down and nose and chin was touching breast infant, LC observed swallows and infant breastfeed for 10 minutes.  LC discussed with mom how to break latch and remove infant from breast, mom's nipples were well rounded and per mom, she only felt a tug at breast when infant was latched. LC discussed hand expression,  mom taught back and infant was given 1 ml of colostrum by spoon.  Infant started cuing again to breastfed, mom re-latch infant on left breast using the football hold, infant breastfed for an additional 5 minutes. Infant appeared content after feeding. Mom knows to breastfeed infant according hunger cues, 8 to 12 times within 24 hours and on demand.  Mom will continue to do STS as much as possible.  Mom knows to call Nurse or [place] if she has any questions, concerns or need assistance with latching infant to breast. Reviewed Baby & Me book's Breastfeeding Basics.  Mom made aware of O/P services, breastfeeding support groups, community resources, and our phone # for post-discharge questions.   Maternal Data Formula Feeding for Exclusion: Yes Reason for exclusion: Mother's choice to formula and breast feed on admission Has patient been taught Hand Expression?: Yes(Infant given 1 ml of colostrum by spoon.) Does the patient have breastfeeding experience prior to this delivery?: No  Feeding Feeding Type: Breast Fed  LATCH  Score Latch: Grasps breast easily, tongue down, lips flanged, rhythmical sucking.  Audible Swallowing: Spontaneous and intermittent  Type of Nipple: Everted at rest and after stimulation  Comfort (Breast/Nipple): Soft / non-tender  Hold (Positioning): Assistance needed to correctly position infant at breast and maintain latch.  LATCH Score: 9  Interventions Interventions: Breast feeding basics reviewed;Breast compression;Assisted with latch;Adjust position;Skin to skin;Support pillows;Breast massage;Position options;Hand express;Expressed milk  Lactation Tools Discussed/Used WIC Program: No   Consult Status Consult Status: Follow-up Date: 03/26/19 Follow-up type: In-patient    Vicente Serene 03/26/2019, 12:34 AM

## 2019-03-26 NOTE — Progress Notes (Signed)
POSTPARTUM PROGRESS NOTE  Post Partum Day 1  Subjective:  Katie Powell is a 28 y.o. [id number] s/p SVD at [redacted]w[redacted]d.  She reports she is doing well. No acute events overnight. She denies any problems with ambulating, voiding or po intake. Denies nausea or vomiting.  Pain is well controlled.  Lochia is approproate.  Objective: Blood pressure 115/73, pulse 77, temperature 98 F (36.7 C), temperature source Oral, resp. rate 18, height 5\' 6"  (1.676 m), weight 94 kg, last menstrual period 06/24/2018, SpO2 100 %, unknown if currently breastfeeding.  Physical Exam:  General: alert, cooperative and no distress Chest: no respiratory distress Heart:regular rate, distal pulses intact Abdomen: soft, nontender,  Uterine Fundus: firm, appropriately tender DVT Evaluation: No calf swelling or tenderness Extremities: No LE edema Skin: warm, dry  Recent Labs    03/25/19 0857 03/26/19 0411  HGB 10.8* 8.7*  HCT 34.6* 27.6*    Assessment/Plan: Katie Powell is a 28 y.o. G2P1011 s/p SVD at [redacted]w[redacted]d   PPD#1 - Doing well  Routine postpartum care Contraception: POPs Feeding: Both  Dispo: Plan for discharge PPD#2.   LOS: 1 day   Phill Myron, D.O. OB Fellow  03/26/2019, 7:33 AM

## 2019-03-26 NOTE — Anesthesia Postprocedure Evaluation (Signed)
Anesthesia Post Note  Patient: Katie Powell  Procedure(s) Performed: AN AD [place]     Patient location during evaluation: Mother Baby Anesthesia Type: Epidural Level of consciousness: awake and alert, oriented and patient cooperative Pain management: pain level controlled Vital Signs Assessment: post-procedure vital signs reviewed and stable Respiratory status: spontaneous breathing Cardiovascular status: stable Postop Assessment: no headache, epidural receding, no signs of nausea or vomiting and able to ambulate Anesthetic complications: no Comments: Pt. States she is walking.  Pain score 0.     Last Vitals:  Vitals:   03/26/19 0301 03/26/19 0635  BP: 114/74 115/73  Pulse: 73 77  Resp: 18 18  Temp: 37 C 36.7 C  SpO2: 100%     Last Pain:  Vitals:   03/26/19 0635  TempSrc: Oral  PainSc: 0-No pain   Pain Goal: Patients Stated Pain Goal: 2 (03/26/19 0301)                 Rico Sheehan

## 2019-03-26 NOTE — Lactation Note (Signed)
This note was copied from a baby's chart. Lactation Consultation Note  Patient Name: Katie Powell [id number] Date: 03/26/2019 Reason for consult: Follow-up assessment;[place]apara Infant with pacifier on arrival.Infant now 75 hours old.  Mom reports she has breastfed better today.  Mom reports she did a lot of reading in preparation for having a baby. Discussed exclusive bf with mom.  Mom reprots she plans to do both because she will go back to work.  Even though mom reports she has [place]  For home use.  Urged to always breastfeed before giving formula.  Mom in agreement. Mom had questions regarding best formula to use and if her baby needs Vit d drops.  Urged mom to offer standard milk based 19-20 calorie infant formula to infant if no breastmilk available or mom unable to breastfeed.  Encouraged her to discuss VIT D supplementation with her pediatrician.Praised breastfeeding.  Urged mom to call lactation as needed. Maternal Data Formula Feeding for Exclusion: Yes Reason for exclusion: Mother's choice to formula feed on admision  Feeding Feeding Type: Breast Fed  LATCH Score Latch: Repeated attempts needed to sustain latch, nipple held in mouth throughout feeding, stimulation needed to elicit sucking reflex.  Audible Swallowing: Spontaneous and intermittent  Type of Nipple: Everted at rest and after stimulation  Comfort (Breast/Nipple): Soft / non-tender  Hold (Positioning): Assistance needed to correctly position infant at breast and maintain latch.  LATCH Score: 8  Interventions Interventions: Breast feeding basics reviewed  Lactation Tools Discussed/Used     Consult Status Consult Status: Follow-up Date: 03/27/19 Follow-up type: In-patient    [facility] Caul 03/26/2019, 6:53 PM

## 2019-03-27 MED ORDER — IBUPROFEN 600 MG PO TABS: 600.0000 mg | ORAL_TABLET | Freq: Four times a day (QID) | ORAL | 0 refills | Status: DC

## 2019-03-27 NOTE — Progress Notes (Signed)
CSW received and acknowledges consult for EDPS of 9.  Consult screened out due to 9 on EDPS does not warrant a CSW consult.  MOB whom scores are greater than 9/yes to question 10 on Edinburgh Postpartum Depression Screen warrants a CSW consult.    Farhana Fellows S. Amber Guthridge, MSW, LCSW [facility] [place] [phone]   

## 2019-03-27 NOTE — Discharge Instructions (Signed)
Postpartum Baby Blues The postpartum period begins right after the birth of a baby. During this time, there is often a lot of joy and excitement. It is also a time of many changes in the life of the parents. No matter how many times a mother gives birth, each child brings new challenges to the family, including different ways of relating to one another. It is common to have feelings of excitement along with confusing changes in moods, emotions, and thoughts. You may feel happy one minute and sad or stressed the next. These feelings of sadness usually happen in the period right after you have your baby, and they go away within a week or two. This is called the "baby blues." What are the causes? There is no known cause of baby blues. It is likely caused by a combination of factors. However, changes in hormone levels after childbirth are believed to trigger some of the symptoms. Other factors that can play a role in these mood changes include:  Lack of sleep.  Stressful life events, such as poverty, caring for a loved one, or death of a loved one.  Genetics. What are the signs or symptoms? Symptoms of this condition include:  Brief changes in mood, such as going from extreme happiness to sadness.  Decreased concentration.  Difficulty sleeping.  Crying spells and tearfulness.  Loss of appetite.  Irritability.  Anxiety. If the symptoms of baby blues last for more than 2 weeks or become more severe, you may have postpartum depression. How is this diagnosed? This condition is diagnosed based on an evaluation of your symptoms. There are no medical or lab tests that lead to a diagnosis, but there are various questionnaires that a health care provider may use to identify women with the baby blues or postpartum depression. How is this treated? Treatment is not needed for this condition. The baby blues usually go away on their own in 1-2 weeks. Social support is often all that is needed. You will  be encouraged to get adequate sleep and rest. Follow these instructions at home: Lifestyle      Get as much rest as you can. Take a nap when the baby sleeps.  Exercise regularly as told by your health care provider. Some women find yoga and walking to be helpful.  Eat a balanced and nourishing diet. This includes plenty of fruits and vegetables, whole grains, and lean proteins.  Do little things that you enjoy. Have a cup of tea, take a bubble bath, read your favorite magazine, or listen to your favorite music.  Avoid alcohol.  Ask for help with household chores, cooking, grocery shopping, or running errands. Do not try to do everything yourself. Consider hiring a postpartum doula to help. This is a professional who specializes in providing support to new mothers.  Try not to make any major life changes during pregnancy or right after giving birth. This can add stress. General instructions  Talk to people close to you about how you are feeling. Get support from your partner, family members, friends, or other new moms. You may want to join a support group.  Find ways to cope with stress. This may include: ? Writing your thoughts and feelings in a journal. ? Spending time outside. ? Spending time with people who make you laugh.  Try to stay positive in how you think. Think about the things you are grateful for.  Take over-the-counter and prescription medicines only as told by your health care provider.    Let your health care provider know if you have any concerns.  Keep all postpartum visits as told by your health care provider. This is important. Contact a health care provider if:  Your baby blues do not go away after 2 weeks. Get help right away if:  You have thoughts of taking your own life (suicidal thoughts).  You think you may harm the baby or other people.  You see or hear things that are not there (hallucinations). Summary  After giving birth, you may feel happy  one minute and sad or stressed the next. Feelings of sadness that happen right after the baby is born and go away after a week or two are called the "baby blues."  You can manage the baby blues by getting enough rest, eating a healthy diet, exercising, spending time with supportive people, and finding ways to cope with stress.  If feelings of sadness and stress last longer than 2 weeks or get in the way of caring for your baby, talk to your health care provider. This may mean you have postpartum depression. This information is not intended to replace advice given to you by your health care provider. Make sure you discuss any questions you have with your health care provider. Document Released: 01/30/2004 Document Revised: 08/19/2018 Document Reviewed: 06/23/2016 Elsevier Patient Education  2020 [place]. Postpartum Care After Vaginal Delivery This sheet gives you information about how to care for yourself from the time you deliver your baby to up to 6-12 weeks after delivery (postpartum period). Your health care provider may also give you more specific instructions. If you have problems or questions, contact your health care provider. Follow these instructions at home: Vaginal bleeding  It is normal to have vaginal bleeding (lochia) after delivery. Wear a sanitary pad for vaginal bleeding and discharge. ? During the first week after delivery, the amount and appearance of lochia is often similar to a menstrual period. ? Over the next few weeks, it will gradually decrease to a dry, yellow-brown discharge. ? For most women, lochia stops completely by 4-6 weeks after delivery. Vaginal bleeding can vary from woman to woman.  Change your sanitary pads frequently. Watch for any changes in your flow, such as: ? A sudden increase in volume. ? A change in color. ? Large blood clots.  If you pass a blood clot from your vagina, save it and call your health care provider to discuss. Do not flush blood  clots down the toilet before talking with your health care provider.  Do not use tampons or douches until your health care provider says this is safe.  If you are not breastfeeding, your period should return 6-8 weeks after delivery. If you are feeding your child breast milk only (exclusive breastfeeding), your period may not return until you stop breastfeeding. Perineal care  Keep the area between the vagina and the anus (perineum) clean and dry as told by your health care provider. Use medicated pads and pain-relieving sprays and creams as directed.  If you had a cut in the perineum (episiotomy) or a tear in the vagina, check the area for signs of infection until you are healed. Check for: ? More redness, swelling, or pain. ? Fluid or blood coming from the cut or tear. ? Warmth. ? Pus or a bad smell.  You may be given a squirt bottle to use instead of wiping to clean the perineum area after you go to the bathroom. As you start healing, you may use  the squirt bottle before wiping yourself. Make sure to wipe gently.  To relieve pain caused by an episiotomy, a tear in the vagina, or swollen veins in the anus (hemorrhoids), try taking a warm sitz bath 2-3 times a day. A sitz bath is a warm water bath that is taken while you are sitting down. The water should only come up to your hips and should cover your buttocks. Breast care  Within the first few days after delivery, your breasts may feel heavy, full, and uncomfortable (breast engorgement). Milk may also leak from your breasts. Your health care provider can suggest ways to help relieve the discomfort. Breast engorgement should go away within a few days.  If you are breastfeeding: ? Wear a bra that supports your breasts and fits you well. ? Keep your nipples clean and dry. Apply creams and ointments as told by your health care provider. ? You may need to use breast pads to absorb milk that leaks from your breasts. ? You may have uterine  contractions every time you breastfeed for up to several weeks after delivery. Uterine contractions help your uterus return to its normal size. ? If you have any problems with breastfeeding, work with your health care provider or Science writer.  If you are not breastfeeding: ? Avoid touching your breasts a lot. Doing this can make your breasts produce more milk. ? Wear a good-fitting bra and use cold packs to help with swelling. ? Do not squeeze out (express) milk. This causes you to make more milk. Intimacy and sexuality  Ask your health care provider when you can engage in sexual activity. This may depend on: ? Your risk of infection. ? How fast you are healing. ? Your comfort and desire to engage in sexual activity.  You are able to get pregnant after delivery, even if you have not had your period. If desired, talk with your health care provider about methods of birth control (contraception). Medicines  Take over-the-counter and prescription medicines only as told by your health care provider.  If you were prescribed an antibiotic medicine, take it as told by your health care provider. Do not stop taking the antibiotic even if you start to feel better. Activity  Gradually return to your normal activities as told by your health care provider. Ask your health care provider what activities are safe for you.  Rest as much as possible. Try to rest or take a nap while your baby is sleeping. Eating and drinking   Drink enough fluid to keep your urine pale yellow.  Eat high-fiber foods every day. These may help prevent or relieve constipation. High-fiber foods include: ? Whole grain cereals and breads. ? Brown rice. ? Beans. ? Fresh fruits and vegetables.  Do not try to lose weight quickly by cutting back on calories.  Take your prenatal vitamins until your postpartum checkup or until your health care provider tells you it is okay to stop. Lifestyle  Do not use any products  that contain nicotine or tobacco, such as cigarettes and e-cigarettes. If you need help quitting, ask your health care provider.  Do not drink alcohol, especially if you are breastfeeding. General instructions  Keep all follow-up visits for you and your baby as told by your health care provider. Most women visit their health care provider for a postpartum checkup within the first 3-6 weeks after delivery. Contact a health care provider if:  You feel unable to cope with the changes that your child brings  to your life, and these feelings do not go away.  You feel unusually sad or worried.  Your breasts become red, painful, or hard.  You have a fever.  You have trouble holding urine or keeping urine from leaking.  You have little or no interest in activities you used to enjoy.  You have not breastfed at all and you have not had a menstrual period for 12 weeks after delivery.  You have stopped breastfeeding and you have not had a menstrual period for 12 weeks after you stopped breastfeeding.  You have questions about caring for yourself or your baby.  You pass a blood clot from your vagina. Get help right away if:  You have chest pain.  You have difficulty breathing.  You have sudden, severe leg pain.  You have severe pain or cramping in your lower abdomen.  You bleed from your vagina so much that you fill more than one sanitary pad in one hour. Bleeding should not be heavier than your heaviest period.  You develop a severe headache.  You faint.  You have blurred vision or spots in your vision.  You have bad-smelling vaginal discharge.  You have thoughts about hurting yourself or your baby. If you ever feel like you may hurt yourself or others, or have thoughts about taking your own life, get help right away. You can go to the nearest emergency department or call:  Your local emergency services (911 in the [place]  A suicide crisis helpline, such as the [facility] at 331-179-1216. This is open 24 hours a day. Summary  The period of time right after you deliver your newborn up to 6-12 weeks after delivery is called the postpartum period.  Gradually return to your normal activities as told by your health care provider.  Keep all follow-up visits for you and your baby as told by your health care provider. This information is not intended to replace advice given to you by your health care provider. Make sure you discuss any questions you have with your health care provider. Document Released: 02/22/2007 Document Revised: 04/30/2017 Document Reviewed: 02/08/2017 Elsevier Patient Education  2020 [employer].

## 2019-03-27 NOTE — Lactation Note (Signed)
This note was copied from a baby's chart. Lactation Consultation Note  Patient Name: Katie Powell [id number] Date: 03/27/2019 Reason for consult: Follow-up assessment;Nipple pain/trauma Mom c/o sore nipples and would like to take a break and rest nipples.  She has a pump at home.  Instructed to pump every 3 hours to establish and maintain milk supply.  Encouraged to call prn.  Maternal Data    Feeding Feeding Type: Bottle Fed - Formula Nipple Type: Slow - flow  LATCH Score                   Interventions    Lactation Tools Discussed/Used     Consult Status Consult Status: Complete Follow-up type: Call as needed    Ave Filter 03/27/2019, 10:16 AM

## 2019-03-29 LAB — TYPE AND SCREEN
ABO/RH(D): O POS
Antibody Screen: POSITIVE
Donor AG Type: NEGATIVE
Donor AG Type: NEGATIVE
Unit division: 0
Unit division: 0

## 2019-03-29 LAB — BPAM RBC
Blood Product Expiration Date: 202012212359
Blood Product Expiration Date: 202012212359
Unit Type and Rh: 5100
Unit Type and Rh: 5100

## 2019-03-30 ENCOUNTER — Telehealth: Payer: Managed Care, Other (non HMO) | Admitting: Obstetrics and Gynecology

## 2019-03-31 ENCOUNTER — Inpatient Hospital Stay (HOSPITAL_COMMUNITY)
Admission: AD | Admit: 2019-03-31 | Payer: Managed Care, Other (non HMO) | Source: Home / Self Care | Admitting: Family Medicine

## 2019-04-07 ENCOUNTER — Inpatient Hospital Stay (HOSPITAL_COMMUNITY): Payer: Managed Care, Other (non HMO)

## 2019-04-27 ENCOUNTER — Telehealth (INDEPENDENT_AMBULATORY_CARE_PROVIDER_SITE_OTHER): Payer: Managed Care, Other (non HMO) | Admitting: Obstetrics & Gynecology

## 2019-04-27 ENCOUNTER — Encounter: Payer: Self-pay | Admitting: Obstetrics & Gynecology

## 2019-04-27 DIAGNOSIS — F53 Postpartum depression: Secondary | ICD-10-CM

## 2019-04-27 DIAGNOSIS — O99345 Other mental disorders complicating the puerperium: Secondary | ICD-10-CM

## 2019-04-27 DIAGNOSIS — Z30011 Encounter for initial prescription of contraceptive pills: Secondary | ICD-10-CM

## 2019-04-27 MED ORDER — NORETHIN ACE-ETH ESTRAD-FE 1-20 MG-MCG(24) PO TABS: 1.0000 | ORAL_TABLET | Freq: Every day | ORAL | 11 refills | Status: DC

## 2019-04-27 NOTE — Progress Notes (Signed)
     TELEHEALTH OBSTETRICS POSTPARTUM VIRTUAL VIDEO VISIT ENCOUNTER NOTE  Provider location: [facility] [place] [facility]   I connected with [name] on 04/27/19 at  9:00 AM EST by MyChart Video Encounter at home and verified that I am speaking with the correct person using two identifiers.   I discussed the limitations, risks, security and privacy concerns of performing an evaluation and management service virtually and the availability of in person appointments. I also discussed with the patient that there may be a patient responsible charge related to this service. The patient expressed understanding and agreed to proceed.  Subjective:     KENEDIE GAJDA is a 28 y.o. G69P1011 female who is being evaluated for a postpartum visit. She is 4 weeks postpartum following a spontaneous vaginal delivery. I have fully reviewed the prenatal and intrapartum course. The delivery was at 38 gestational weeks. Outcome: spontaneous vaginal delivery. Anesthesia: epidural. Postpartum course has been uncomplicated. Baby's course has been uncomplicated. Baby is feeding by bottle - [employer] Good Start. Bleeding no bleeding. Bowel function is normal. Bladder function is normal. Patient is not sexually active. Contraception method is OCP (estrogen/progesterone). Postpartum depression screening: positive (score: 15). She feels overwhelmed, has minimal support. Denies any HI/SI.  The following portions of the patient's history were reviewed and updated as appropriate: allergies, current medications, past family history, past medical history, past social history, past surgical history and problem list. Normal pap in 09/07/2018.  Review of Systems Pertinent items noted in HPI and remainder of comprehensive ROS otherwise negative.   Objective:    LMP 06/24/2018   Breastfeeding No   Exam deferred due to virtual nature of visit.       Assessment:     Concerned about postpartum  depression  Plan:    1. Contraception: OCP (estrogen/progesterone) prescribed, will start in 2 weeks. 2. Patient consented to [facility] services about presenting concerns and psychiatric consultation as appropriate. Referral to [facility] counselor placed. 3. Follow up as needed.    I discussed the assessment and treatment plan with the patient. The patient was provided an opportunity to ask questions and all were answered. The patient agreed with the plan and demonstrated an understanding of the instructions. The patient was advised to call back or seek an in-person office evaluation/go to [facility] at [facility] for any urgent or concerning symptoms. Please refer to After Visit Summary for other counseling recommendations.   I provided 10 minutes of face-to-face time during this encounter.  Verita Schneiders, MD, [place] for [employer], [place]

## 2019-04-27 NOTE — BH Specialist Note (Signed)
Pt did not arrive to video visit and did not answer the phone ; Left HIPPA-compliant message to call back Roselyn Reef from [facility] [place] at [phone].  ; left MyChart message for patient.    [place] via Telemedicine Video Visit  04/27/2019 REATHER HALILOVIC [id number]  [place]

## 2019-05-09 ENCOUNTER — Other Ambulatory Visit: Payer: Self-pay

## 2019-05-09 ENCOUNTER — Ambulatory Visit: Payer: Medicaid Other | Admitting: Clinical

## 2019-05-09 DIAGNOSIS — Z91199 Patient's noncompliance with other medical treatment and regimen due to unspecified reason: Secondary | ICD-10-CM

## 2019-05-09 DIAGNOSIS — Z5329 Procedure and treatment not carried out because of patient's decision for other reasons: Secondary | ICD-10-CM

## 2019-06-05 ENCOUNTER — Ambulatory Visit: Payer: Managed Care, Other (non HMO) | Attending: Internal Medicine

## 2019-06-05 DIAGNOSIS — Z20822 Contact with and (suspected) exposure to covid-19: Secondary | ICD-10-CM

## 2019-06-06 LAB — NOVEL CORONAVIRUS, NAA: SARS-CoV-2, NAA: NOT DETECTED

## 2019-06-16 ENCOUNTER — Other Ambulatory Visit: Payer: Self-pay

## 2019-06-16 ENCOUNTER — Ambulatory Visit (INDEPENDENT_AMBULATORY_CARE_PROVIDER_SITE_OTHER): Payer: Managed Care, Other (non HMO) | Admitting: Adult Health Nurse Practitioner

## 2019-06-16 VITALS — BP 136/91 | HR 78 | Temp 97.8°F | Ht 66.0 in | Wt 200.4 lb

## 2019-06-16 DIAGNOSIS — D509 Iron deficiency anemia, unspecified: Secondary | ICD-10-CM

## 2019-06-16 DIAGNOSIS — R5383 Other fatigue: Secondary | ICD-10-CM | POA: Insufficient documentation

## 2019-06-16 NOTE — Patient Instructions (Signed)
° ° ° °  If you have lab work done today you will be contacted with your lab results within the next 2 weeks.  If you have not heard from us then please contact us. The fastest way to get your results is to register for My Chart. ° ° °IF you received an x-ray today, you will receive an invoice from [facility]. Please contact [facility] at [phone] with questions or concerns regarding your invoice.  ° °IF you received labwork today, you will receive an invoice from [facility]. Please contact [facility] at [phone] with questions or concerns regarding your invoice.  ° °Our billing staff will not be able to assist you with questions regarding bills from these companies. ° °You will be contacted with the lab results as soon as they are available. The fastest way to get your results is to activate your My Chart account. Instructions are located on the last page of this paperwork. If you have not heard from us regarding the results in 2 weeks, please contact this office. °  ° ° ° °

## 2019-06-16 NOTE — Progress Notes (Signed)
Chief Complaint  Patient presents with  . Fatigue    Pt stated that she have been feeling tired and after she had the baby her iron went dw to a 7    HPI   Patient presents with fatigue.  She has a 60 month old who is healthy.  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  Sleeping intermittently due to being a new mom.  She was told she was anemic during pregnancy and previously had a blood transfusion prior to pregnancy that produced red cell antibodies.  Feels run down with weight gain.  Some post-partum depression, mild.  She has a hx of severe anxiety in the past but cannot tell if she is depressed or if it is fatigue she is feeling currently.  Her boyfriend and sister help her with the baby.   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  Medications    Current Outpatient Medications:  .  iron polysaccharides (NIFEREX) 150 MG capsule, Take 1 capsule (150 mg total) by mouth 2 (two) times daily., Disp: 60 capsule, Rfl: 5 .  Norethindrone Acetate-Ethinyl Estrad-FE (LOESTRIN 24 FE) 1-20 MG-MCG(24) tablet, Take 1 tablet by mouth daily., Disp: 1 Package, Rfl: 11 .  polyethylene glycol (MIRALAX / GLYCOLAX) 17 g packet, Take 17 g by mouth daily., Disp: , Rfl:  .  ibuprofen (ADVIL) 600 MG tablet, Take 1 tablet (600 mg total) by  mouth every 6 (six) hours. (Patient not taking: Reported on 04/27/2019), Disp: 30 tablet, Rfl: 0 .  Prenatal Vit w/Fe-Methylfol-FA (PNV PO), Take by mouth., Disp: , Rfl:    Review of Systems    Constitutional: Positive for fatigue and weight gain.  HENT: Negative for congestion, nosebleeds, trouble swallowing and voice change.   Respiratory: Negative for cough, shortness of breath and wheezing.   Cardiac:  Negative for chest pain, pressure, syncope  Gastrointestinal: Negative for diarrhea, nausea and vomiting.  Genitourinary: Negative for difficulty urinating, dysuria, flank pain and hematuria.  Musculoskeletal: Negative for back pain, joint swelling and neck pain.  Neurological: Negative for dizziness, speech difficulty, light-headedness and numbness.  See HPI. All other review of systems negative.     Physical Exam:    height is '5\' 6"'$  (1.676 m) and weight is 200 lb 6.4 oz (90.9 kg). Her temporal temperature is 97.8 F (36.6 C). Her blood pressure is 136/91 (abnormal) and her pulse is 78. Her oxygen saturation is 97%.   Physical Examination: General appearance - alert, well appearing, and in no distress and oriented to person, place, and time Mental status - normal mood, behavior, speech, dress, motor activity, and thought processes Eyes - PERRL. Extraocular movements intact.  No nystagmus.  Neck - supple, no significant adenopathy, carotids upstroke normal bilaterally, no bruits, thyroid exam: thyroid is normal in size without nodules or tenderness Chest - clear to auscultation, no wheezes, rales or rhonchi, symmetric air entry  Heart - normal rate, regular rhythm, normal  S1, S2, no murmurs, rubs, clicks or gallops Extremities - dependent LE edema without clubbing or cyanosis Skin - normal coloration and turgor, no rashes, no suspicious skin lesions noted  No hyperpigmentation of skin.  No current hematomas noted   Lab /Imaging Review    no lab studies available for review at time  of visit.   Assessment & Plan:  Katie Powell is a 29 y.o. female    1. Iron deficiency anemia, unspecified iron deficiency anemia type   2. Other fatigue   No orders of the defined types were placed in this encounter.  Orders Placed This Encounter  Procedures  . CBC with Differential  . Iron, TIBC and Ferritin Panel  . Thyroid Panel With TSH  . CMP14+EGFR   Will f/u with the patient pending results.  Discussed that it's not uncommon to have depression as a new mom.  If she gets to the point where she wants to talk with someone or consider medication, I have encouraged her to come back to be evaluated.  She is inline with this plan.   Glyn Ade, NP

## 2019-06-17 LAB — CBC WITH DIFFERENTIAL/PLATELET
Basophils Absolute: 0 10*3/uL (ref 0.0–0.2)
Basos: 1 %
EOS (ABSOLUTE): 0.1 10*3/uL (ref 0.0–0.4)
Eos: 2 %
Hematocrit: 35.9 % (ref 34.0–46.6)
Hemoglobin: 11.9 g/dL (ref 11.1–15.9)
Immature Grans (Abs): 0 10*3/uL (ref 0.0–0.1)
Immature Granulocytes: 0 %
Lymphocytes Absolute: 3.1 10*3/uL (ref 0.7–3.1)
Lymphs: 41 %
MCH: 27 pg (ref 26.6–33.0)
MCHC: 33.1 g/dL (ref 31.5–35.7)
MCV: 82 fL (ref 79–97)
Monocytes Absolute: 0.6 10*3/uL (ref 0.1–0.9)
Monocytes: 8 %
Neutrophils Absolute: 3.6 10*3/uL (ref 1.4–7.0)
Neutrophils: 48 %
Platelets: 313 10*3/uL (ref 150–450)
RBC: 4.4 x10E6/uL (ref 3.77–5.28)
RDW: 15.4 % (ref 11.7–15.4)
WBC: 7.5 10*3/uL (ref 3.4–10.8)

## 2019-06-17 LAB — CMP14+EGFR
ALT: 12 IU/L (ref 0–32)
AST: 18 IU/L (ref 0–40)
Albumin/Globulin Ratio: 1.3 (ref 1.2–2.2)
Albumin: 4.1 g/dL (ref 3.9–5.0)
Alkaline Phosphatase: 60 IU/L (ref 39–117)
BUN/Creatinine Ratio: 15 (ref 9–23)
BUN: 11 mg/dL (ref 6–20)
Bilirubin Total: 0.4 mg/dL (ref 0.0–1.2)
CO2: 20 mmol/L (ref 20–29)
Calcium: 9.4 mg/dL (ref 8.7–10.2)
Chloride: 104 mmol/L (ref 96–106)
Creatinine, Ser: 0.73 mg/dL (ref 0.57–1.00)
GFR calc Af Amer: 131 mL/min/{1.73_m2} (ref >59)
GFR calc non Af Amer: 114 mL/min/{1.73_m2} (ref >59)
Globulin, Total: 3.1 g/dL (ref 1.5–4.5)
Glucose: 90 mg/dL (ref 65–99)
Potassium: 3.8 mmol/L (ref 3.5–5.2)
Sodium: 137 mmol/L (ref 134–144)
Total Protein: 7.2 g/dL (ref 6.0–8.5)

## 2019-06-17 LAB — THYROID PANEL WITH TSH
Free Thyroxine Index: 1.9 (ref 1.2–4.9)
T3 Uptake Ratio: 21 % — ABNORMAL LOW (ref 24–39)
T4, Total: 8.9 ug/dL (ref 4.5–12.0)
TSH: 0.977 u[IU]/mL (ref 0.450–4.500)

## 2019-06-17 LAB — IRON,TIBC AND FERRITIN PANEL
Ferritin: 9 ng/mL — ABNORMAL LOW (ref 15–150)
Iron Saturation: 5 % — CL (ref 15–55)
Iron: 24 ug/dL — ABNORMAL LOW (ref 27–159)
Total Iron Binding Capacity: 444 ug/dL (ref 250–450)
UIBC: 420 ug/dL (ref 131–425)

## 2019-06-28 ENCOUNTER — Ambulatory Visit: Payer: Managed Care, Other (non HMO) | Attending: Internal Medicine

## 2019-06-28 DIAGNOSIS — Z20822 Contact with and (suspected) exposure to covid-19: Secondary | ICD-10-CM

## 2019-06-29 LAB — NOVEL CORONAVIRUS, NAA: SARS-CoV-2, NAA: NOT DETECTED

## 2019-07-31 ENCOUNTER — Ambulatory Visit: Payer: Managed Care, Other (non HMO) | Attending: Internal Medicine

## 2019-07-31 DIAGNOSIS — Z20822 Contact with and (suspected) exposure to covid-19: Secondary | ICD-10-CM

## 2019-08-01 LAB — SARS-COV-2, NAA 2 DAY TAT

## 2019-08-01 LAB — NOVEL CORONAVIRUS, NAA: SARS-CoV-2, NAA: NOT DETECTED

## 2019-08-21 ENCOUNTER — Ambulatory Visit: Payer: Medicaid Other | Attending: Internal Medicine

## 2019-08-21 DIAGNOSIS — Z20822 Contact with and (suspected) exposure to covid-19: Secondary | ICD-10-CM

## 2019-08-22 LAB — SARS-COV-2, NAA 2 DAY TAT

## 2019-08-22 LAB — NOVEL CORONAVIRUS, NAA: SARS-CoV-2, NAA: NOT DETECTED

## 2019-09-29 ENCOUNTER — Ambulatory Visit (HOSPITAL_COMMUNITY)
Admission: EM | Admit: 2019-09-29 | Discharge: 2019-09-29 | Disposition: A | Discharge status: Home / Self Care | Payer: Medicaid Other | Attending: Physician Assistant | Admitting: Physician Assistant

## 2019-09-29 ENCOUNTER — Encounter (HOSPITAL_COMMUNITY): Payer: Self-pay

## 2019-09-29 ENCOUNTER — Other Ambulatory Visit: Payer: Self-pay

## 2019-09-29 DIAGNOSIS — R0989 Other specified symptoms and signs involving the circulatory and respiratory systems: Secondary | ICD-10-CM | POA: Diagnosis not present

## 2019-09-29 DIAGNOSIS — J309 Allergic rhinitis, unspecified: Secondary | ICD-10-CM

## 2019-09-29 DIAGNOSIS — R198 Other specified symptoms and signs involving the digestive system and abdomen: Secondary | ICD-10-CM

## 2019-09-29 DIAGNOSIS — F411 Generalized anxiety disorder: Secondary | ICD-10-CM

## 2019-09-29 DIAGNOSIS — R09A2 Foreign body sensation, throat: Secondary | ICD-10-CM

## 2019-09-29 MED ORDER — CETIRIZINE HCL 10 MG PO TABS: 10.0000 mg | ORAL_TABLET | Freq: Every day | ORAL | 0 refills | Status: DC

## 2019-09-29 MED ORDER — HYDROXYZINE HCL 25 MG PO TABS: 25.0000 mg | ORAL_TABLET | Freq: Three times a day (TID) | ORAL | 0 refills | Status: DC | PRN

## 2019-09-29 MED ORDER — FLUTICASONE PROPIONATE 50 MCG/ACT NA SUSP: 1.0000 | Freq: Every day | NASAL | 2 refills | Status: DC

## 2019-09-29 NOTE — ED Provider Notes (Signed)
[place]    CSN: [id number] Arrival date & time: 09/29/19  1248      History   Chief Complaint Chief Complaint  Patient presents with  . Anxiety    HPI Katie Powell is a 29 y.o. female.   He sleepsPatient with history of anxiety, postpartum depression and anemia present for intermittent episodes of shortness of breath and feeling like throat is swelling.  She reports she will have an episode every few days where she will feel like she cannot breathe such that there is something clogging her throat or that her throat is swelling.  She reports this makes her feel very anxious and it takes between 5 and 20 minutes to calm down for the sensation to subside.  She reports feeling lots of mucus at times when this occurs.  She denies any chest pains when this occurs.  She denies feeling like her heart is racing heart when this occurs.  She denies sweating when this occurs.  She reports outside of these episodes she does not feel short of breath.  She denies any sore throat.  She reports she is able to swallow food and water without any issues.  She does report that she feels like swallowing pills is difficult.  She reports she is not anxious all the time.  She reports she does not have much stress.  She reports she is very happy is a new mother to a 84-month-old child.  She denies any suicidal or homicidal thoughts.  She states she is very frustrated as she states her sister believes this is anxiety and keeps telling her to" identify her triggers and avoid them".  She believes this is not her anxiety and that she would like to see an ENT doctor as she believes there is something going on in her throat.  She reports for about 5 hours a night.  She reports she has very light between 4 and 5 gets up around 11.  She reports this is been due to wanting to have time to herself when the baby is sleeping.  She does report it is hard for her to fall asleep but would not elaborate on why.  She  reports good appetite.       Past Medical History:  Diagnosis Date  . Anemia   . Anxiety   . Establishing care with new doctor, encounter for 03/09/2019    Patient Active Problem List   Diagnosis Date Noted  . Other fatigue 06/16/2019  . Normal vaginal delivery 03/27/2019  . Obstetrical laceration 03/27/2019  . Variable fetal heart rate decelerations, antepartum 03/25/2019  . Encounter for induction of labor 03/25/2019  . Establishing care with new doctor, encounter for 03/09/2019  . 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 Supervision of other normal pregnancy, antepartum 09/06/2018  . Iron deficiency anemia due to chronic blood loss 08/20/2016  . Thrombocytosis ([place]) 08/20/2016    Past Surgical History:  Procedure Laterality Date  . EYE SURGERY    . FINGER SURGERY      OB History    Gravida  2   Para  1   Term  1   Preterm      AB  1   Living  1     SAB  1   TAB  0   Ectopic      Multiple  0   Live  Births  1            Home Medications    Prior to Admission medications   Medication Sig Start Date End Date Taking? Authorizing Provider  cetirizine (ZYRTEC ALLERGY) 10 MG tablet Take 1 tablet (10 mg total) by mouth daily. 09/29/19   Brylin Stopper, Marguerita Beards, PA-C  fluticasone (FLONASE) 50 MCG/ACT nasal spray Place 1 spray into both nostrils daily. 09/29/19   Caige Almeda, Marguerita Beards, PA-C  hydrOXYzine (ATARAX/VISTARIL) 25 MG tablet Take 1 tablet (25 mg total) by mouth every 8 (eight) hours as needed. 09/29/19   Mahamud Metts, Marguerita Beards, PA-C  ibuprofen (ADVIL) 600 MG tablet Take 1 tablet (600 mg total) by mouth every 6 (six) hours. Patient not taking: Reported on 04/27/2019 03/27/19   Gavin Pound, CNM  iron polysaccharides (NIFEREX) 150 MG capsule Take 1 capsule (150 mg total) by mouth 2 (two) times daily. 11/07/18   Brunetta Genera, MD  Norethindrone  Acetate-Ethinyl Estrad-FE (LOESTRIN 24 FE) 1-20 MG-MCG(24) tablet Take 1 tablet by mouth daily. 04/27/19   Anyanwu, Sallyanne Havers, MD  polyethylene glycol (MIRALAX / GLYCOLAX) 17 g packet Take 17 g by mouth daily.    [provider]  Prenatal Vit w/Fe-Methylfol-FA (PNV PO) Take by mouth.    [provider]    Family History History reviewed. No pertinent family history.  Social History Social History   Tobacco Use  . Smoking status: Former Smoker    Packs/day: 0.50    Types: Cigarettes  . Smokeless tobacco: Never Used  Substance Use Topics  . Alcohol use: Not Currently  . Drug use: No     Allergies   Patient has no known allergies.   Review of Systems Review of Systems   Physical Exam Triage Vital Signs ED Triage Vitals  Enc Vitals Group     BP 09/29/19 1400 128/85     Pulse Rate 09/29/19 1400 75     Resp 09/29/19 1400 12     Temp 09/29/19 1400 98.7 F (37.1 C)     Temp Source 09/29/19 1400 Oral     SpO2 09/29/19 1400 100 %     Weight --      Height --      Head Circumference --      Peak Flow --      Pain Score 09/29/19 1401 0     Pain Loc --      Pain Edu? --      Excl. in [place]? --    No data found.  Updated Vital Signs BP 128/85 (BP Location: Right Arm)   Pulse 75   Temp 98.7 F (37.1 C) (Oral)   Resp 12   LMP 09/28/2019 (Exact Date)   SpO2 100%   Breastfeeding No   Visual Acuity Right Eye Distance:   Left Eye Distance:   Bilateral Distance:    Right Eye Near:   Left Eye Near:    Bilateral Near:     Physical Exam Vitals and nursing note reviewed.  Constitutional:      General: She is not in acute distress.    Appearance: Normal appearance. She is well-developed.  HENT:     Head: Normocephalic and atraumatic.     Nose: Congestion and rhinorrhea present.     Mouth/Throat:     Mouth: Mucous membranes are moist.     Comments: Postnasal drip and secretions visible Eyes:     Conjunctiva/sclera: Conjunctivae normal.  Neck:      Vascular:  No carotid bruit.     Comments: No thyromegaly or nodules appreciated Cardiovascular:     Rate and Rhythm: Normal rate and regular rhythm.     Heart sounds: No murmur.  Pulmonary:     Effort: Pulmonary effort is normal. No respiratory distress.     Breath sounds: Normal breath sounds.  Abdominal:     Palpations: Abdomen is soft.     Tenderness: There is no abdominal tenderness.  Musculoskeletal:     Cervical back: Normal range of motion and neck supple. No rigidity or tenderness.  Lymphadenopathy:     Cervical: No cervical adenopathy.  Skin:    General: Skin is warm and dry.  Neurological:     Mental Status: She is alert.  Psychiatric:        Thought Content: Thought content normal.        Judgment: Judgment normal.     Comments: Patient has anxious appearance and averts gaze.  Fidgety at times.  Otherwise mood is normal and congruent.       UC Treatments / Results  Labs (all labs ordered are listed, but only abnormal results are displayed) Labs Reviewed - No data to display  EKG   Radiology No results found.  Procedures Procedures (including critical care time)  Medications Ordered in UC Medications - No data to display  Initial Impression / Assessment and Plan / UC Course  I have reviewed the triage vital signs and the nursing notes.  Pertinent labs & imaging results that were available during my care of the patient were reviewed by me and considered in my medical decision making (see chart for details).     #Globus sensation #Allergic rhinitis #Anxiety state Patient is a 29 year old with history of anxiety and postpartum depression presenting for sensation of inability to breathe and something being in her throat.  I do believe there is an anxiety component to this however given the sensation of something in her throat we will have her follow-up with ENT for the globus sensation.  Also discussed that she may be feeling nasal drainage that we should  treat her congestion.  Also recommended using hydroxyzine when feeling very anxious.  Instructed patient to reestablish with [facility] primary care.  Patient verbalized understanding the plan Final Clinical Impressions(s) / UC Diagnoses   Final diagnoses:  Globus sensation  Allergic rhinitis, unspecified seasonality, unspecified trigger  Anxiety state     Discharge Instructions     Start the flonase and zyrtec daily  I have sent a medicine that can help when feeling anxious, take 1 tablet when feeling anxious, this may make you sleepy, so do not drive after taking  If you have worsening feeling of shortness of breath please go to the Emergency Department  You may follow up with the ENT group as requested for the feeling in your throat  Reach back out to [place] to re-establish, if unable to do this, try the [facility] medicine center for primary care       ED Prescriptions    Medication Sig Dispense Auth. Provider   fluticasone (FLONASE) 50 MCG/ACT nasal spray Place 1 spray into both nostrils daily. 11.1 mL Myia Bergh, Marguerita Beards, PA-C   cetirizine (ZYRTEC ALLERGY) 10 MG tablet Take 1 tablet (10 mg total) by mouth daily. 30 tablet Aniston Christman, Marguerita Beards, PA-C   hydrOXYzine (ATARAX/VISTARIL) 25 MG tablet Take 1 tablet (25 mg total) by mouth every 8 (eight) hours as needed. 12 tablet Rhonda Linan, Marguerita Beards, PA-C  PDMP not reviewed this encounter.   Purnell Shoemaker, PA-C 09/29/19 2103

## 2019-09-29 NOTE — Discharge Instructions (Addendum)
Start the flonase and zyrtec daily  I have sent a medicine that can help when feeling anxious, take 1 tablet when feeling anxious, this may make you sleepy, so do not drive after taking  If you have worsening feeling of shortness of breath please go to the Emergency Department  You may follow up with the ENT group as requested for the feeling in your throat  Reach back out to [place] to re-establish, if unable to do this, try the Family medicine center for primary care

## 2019-09-29 NOTE — ED Triage Notes (Signed)
Patient reports she has been experiencing SOB randomly throughout the day accompanied with a feeling of being anxious. Reports she has been treated for anxiety in the past, and reports that she just had a baby (36 months old). Patient also worried this could be anemia, which she has a hx of and received iron infusions for.

## 2019-10-16 ENCOUNTER — Ambulatory Visit: Payer: Managed Care, Other (non HMO) | Admitting: Registered Nurse

## 2019-10-17 ENCOUNTER — Encounter: Payer: Self-pay | Admitting: Registered Nurse

## 2019-11-14 ENCOUNTER — Other Ambulatory Visit: Payer: Self-pay

## 2019-11-14 ENCOUNTER — Emergency Department (HOSPITAL_COMMUNITY)
Admission: EM | Admit: 2019-11-14 | Discharge: 2019-11-14 | Disposition: A | Discharge status: Home / Self Care | Payer: Medicaid Other | Attending: Emergency Medicine | Admitting: Emergency Medicine

## 2019-11-14 ENCOUNTER — Encounter (HOSPITAL_COMMUNITY): Payer: Self-pay

## 2019-11-14 DIAGNOSIS — F410 Panic disorder [episodic paroxysmal anxiety] without agoraphobia: Secondary | ICD-10-CM | POA: Insufficient documentation

## 2019-11-14 DIAGNOSIS — Z87891 Personal history of nicotine dependence: Secondary | ICD-10-CM | POA: Diagnosis not present

## 2019-11-14 MED ORDER — HYDROXYZINE HCL 25 MG PO TABS
25.0000 mg | ORAL_TABLET | Freq: Four times a day (QID) | ORAL | 0 refills | Status: DC | PRN
Start: 2019-11-14 — End: 2020-01-02

## 2019-11-14 NOTE — ED Provider Notes (Signed)
TIME SEEN: 3:13 AM  CHIEF COMPLAINT: Panic attack  HPI: Patient is a 29 year old female with history of anxiety who presents to the emergency department with concerns for recurrent panic attacks.  States that she will have episodes where she will feel an impending sense of doom.  States she becomes tachypneic, has palpitations, feels as if she cannot breathe.  States "I have to put my hand under my mouth to make sure that I am breathing".  She states she has been told that these are panic attacks before.  She states she does suffer from anxiety.  She states she has an 68-month-old child at home and is also suffering from insomnia.  She denies any SI or HI.  She reports that she has previously been on Vistaril with relief but is out of this medication.  She has no symptoms currently.  ROS: See HPI Constitutional: no fever  Eyes: no drainage  ENT: no runny nose   Cardiovascular:  no chest pain  Resp: no SOB  GI: no vomiting GU: no dysuria Integumentary: no rash  Allergy: no hives  Musculoskeletal: no leg swelling  Neurological: no slurred speech ROS otherwise negative  PAST MEDICAL HISTORY/PAST SURGICAL HISTORY:  Past Medical History:  Diagnosis Date  . Anemia   . Anxiety   . Establishing care with new doctor, encounter for 03/09/2019    MEDICATIONS:  Prior to Admission medications   Medication Sig Start Date End Date Taking? Authorizing Provider  cetirizine (ZYRTEC ALLERGY) 10 MG tablet Take 1 tablet (10 mg total) by mouth daily. 09/29/19   Darr, Marguerita Beards, PA-C  fluticasone (FLONASE) 50 MCG/ACT nasal spray Place 1 spray into both nostrils daily. 09/29/19   Darr, Marguerita Beards, PA-C  hydrOXYzine (ATARAX/VISTARIL) 25 MG tablet Take 1 tablet (25 mg total) by mouth every 8 (eight) hours as needed. 09/29/19   Darr, Marguerita Beards, PA-C  ibuprofen (ADVIL) 600 MG tablet Take 1 tablet (600 mg total) by mouth every 6 (six) hours. Patient not taking: Reported on 04/27/2019 03/27/19   Gavin Pound, CNM  iron  polysaccharides (NIFEREX) 150 MG capsule Take 1 capsule (150 mg total) by mouth 2 (two) times daily. 11/07/18   Brunetta Genera, MD  Norethindrone Acetate-Ethinyl Estrad-FE (LOESTRIN 24 FE) 1-20 MG-MCG(24) tablet Take 1 tablet by mouth daily. 04/27/19   Anyanwu, Sallyanne Havers, MD  polyethylene glycol (MIRALAX / GLYCOLAX) 17 g packet Take 17 g by mouth daily.    [provider]  Prenatal Vit w/Fe-Methylfol-FA (PNV PO) Take by mouth.    [provider]    ALLERGIES:  No Known Allergies  SOCIAL HISTORY:  Social History   Tobacco Use  . Smoking status: Former Smoker    Packs/day: 0.50    Types: Cigarettes  . Smokeless tobacco: Never Used  Substance Use Topics  . Alcohol use: Not Currently    FAMILY HISTORY: No family history on file.  EXAM: BP 126/81 (BP Location: Left Arm)   Pulse 74   Temp 98.1 F (36.7 C) (Oral)   Resp 20   Ht 5\' 6"  (1.676 m)   Wt 90.3 kg   SpO2 99%   BMI 32.12 kg/m  CONSTITUTIONAL: Alert and oriented and responds appropriately to questions. Well-appearing; well-nourished HEAD: Normocephalic EYES: Conjunctivae clear, pupils appear equal, EOM appear intact ENT: normal nose; moist mucous membranes NECK: Supple, normal ROM CARD: RRR; S1 and S2 appreciated; no murmurs, no clicks, no rubs, no gallops RESP: Normal chest excursion without splinting or tachypnea; breath  sounds clear and equal bilaterally; no wheezes, no rhonchi, no rales, no hypoxia or respiratory distress, speaking full sentences ABD/GI: Nondistended, nontender BACK:  The back appears normal EXT: Normal ROM in all joints; no deformity noted, no edema; no cyanosis SKIN: Normal color for age and race; warm; no rash on exposed skin NEURO: Moves all extremities equally PSYCH: The patient's mood and manner are appropriate.  Good eye contact.  Good insight.  No SI or HI.  Not responding to internal stimuli.  MEDICAL DECISION MAKING: Patient here with complaints of having panic  attacks.  EKG today shows no new ischemic change compared to previous.  Currently asymptomatic.  No SI, HI.  I do not feel she needs emergent psychiatric evaluation.  She does not have an outpatient counselor.  Provided with information for outpatient follow-up and will refill patient's Vistaril given this is helped her previously.  Doubt PE, ACS, dissection, CHF, pneumonia.  At this time, I do not feel there is any life-threatening condition present. I have reviewed, interpreted and discussed all results (EKG, imaging, lab, urine as appropriate) and exam findings with patient/family. I have reviewed nursing notes and appropriate previous records.  I feel the patient is safe to be discharged home without further emergent workup and can continue workup as an outpatient as needed. Discussed usual and customary return precautions. Patient/family verbalize understanding and are comfortable with this plan.  Outpatient follow-up has been provided as needed. All questions have been answered.    EKG Interpretation  Date/Time:  Tuesday November 14 2019 03:33:52 EDT Ventricular Rate:  69 PR Interval:    QRS Duration: 90 QT Interval:  408 QTC Calculation: 438 R Axis:   41 Text Interpretation: Sinus rhythm Borderline T abnormalities, diffuse leads 12 Lead; [place] No significant change since last tracing Confirmed by Pryor Curia [phone]) on 11/14/2019 3:39:38 AM         Tammi Klippel Bonnell Public was evaluated in Emergency Department on 11/14/2019 for the symptoms described in the history of present illness. She was evaluated in the context of the global COVID-19 pandemic, which necessitated consideration that the patient might be at risk for infection with the SARS-CoV-2 virus that causes COVID-19. Institutional protocols and algorithms that pertain to the evaluation of patients at risk for COVID-19 are in a state of rapid change based on information released by regulatory bodies including the CDC and federal and state  organizations. These policies and algorithms were followed during the patient's care in the ED.      Eevie Lapp, Delice Bison, DO 11/14/19 [phone]

## 2019-11-14 NOTE — ED Triage Notes (Signed)
Pt states an anxiety attack earlier in the night and had trouble catching breath. No complaint now.

## 2019-11-28 ENCOUNTER — Ambulatory Visit: Payer: Medicaid Other | Admitting: Registered Nurse

## 2019-11-28 ENCOUNTER — Other Ambulatory Visit: Payer: Self-pay

## 2019-11-28 ENCOUNTER — Encounter: Payer: Self-pay | Admitting: Registered Nurse

## 2019-11-28 VITALS — BP 117/75 | HR 82 | Temp 97.7°F | Ht 66.0 in | Wt 201.4 lb

## 2019-11-28 DIAGNOSIS — Z7689 Persons encountering health services in other specified circumstances: Secondary | ICD-10-CM

## 2019-11-28 DIAGNOSIS — F410 Panic disorder [episodic paroxysmal anxiety] without agoraphobia: Secondary | ICD-10-CM

## 2019-11-28 DIAGNOSIS — F411 Generalized anxiety disorder: Secondary | ICD-10-CM | POA: Diagnosis not present

## 2019-11-28 DIAGNOSIS — F5104 Psychophysiologic insomnia: Secondary | ICD-10-CM | POA: Diagnosis not present

## 2019-11-28 DIAGNOSIS — D500 Iron deficiency anemia secondary to blood loss (chronic): Secondary | ICD-10-CM | POA: Diagnosis not present

## 2019-11-28 MED ORDER — FLUOXETINE HCL 10 MG PO CAPS: 10.0000 mg | ORAL_CAPSULE | Freq: Every day | ORAL | 0 refills | Status: DC

## 2019-11-28 MED ORDER — TRAZODONE HCL 50 MG PO TABS: 25.0000 mg | ORAL_TABLET | Freq: Every evening | ORAL | 3 refills | Status: DC | PRN

## 2019-11-28 MED ORDER — FLUOXETINE HCL 20 MG PO CAPS: 20.0000 mg | ORAL_CAPSULE | Freq: Every day | ORAL | 0 refills | Status: DC

## 2019-11-28 NOTE — Patient Instructions (Signed)
° ° ° °  If you have lab work done today you will be contacted with your lab results within the next 2 weeks.  If you have not heard from us then please contact us. The fastest way to get your results is to register for My Chart. ° ° °IF you received an x-ray today, you will receive an invoice from [facility]. Please contact [facility] at [phone] with questions or concerns regarding your invoice.  ° °IF you received labwork today, you will receive an invoice from [facility]. Please contact [facility] at [phone] with questions or concerns regarding your invoice.  ° °Our billing staff will not be able to assist you with questions regarding bills from these companies. ° °You will be contacted with the lab results as soon as they are available. The fastest way to get your results is to activate your My Chart account. Instructions are located on the last page of this paperwork. If you have not heard from us regarding the results in 2 weeks, please contact this office. °  ° ° ° °

## 2019-11-28 NOTE — Progress Notes (Signed)
Established Patient Office Visit  Subjective:  Patient ID: Katie Powell, female    DOB: 17-Jan-1991  Age: 29 y.o. MRN: [id number]  CC:  Chief Complaint  Patient presents with  . New Patient (Initial Visit)    Anxiety 21/Depression 20. Pt stated that she has been having anxiety attacks since 2018 but they kinda went away then started back up in 08/2019 and it has gotten worse over time. She has noticed that she can't drive at night/day bc of the anxiety.    HPI Katie Powell presents for worsening anxiety and depression  Has been on lorazepam in the past - too potent, patient did not like sedative effects Has been taking hydroxyzine 25mg  PO 6h PRN for anxiety - this has been somewhat helpful but anxiety is still worsening and panic attacks increasing in frequency and intensity History of much trauma - patient does not wish to discuss this at this time Not sleeping well - at times not falling asleep until after 5am, other times sleeping 10-12 hours at a time.  Has infant at home - not breastfeeding.   Denies HI/SI  Interested in counseling  History of anemia - no symptoms at this time but last iron panel was well outside normal limits, will recheck today. Not currently taking Fe supplement.  Past Medical History:  Diagnosis Date  . Anemia   . Anxiety   . Establishing care with new doctor, encounter for 03/09/2019    Past Surgical History:  Procedure Laterality Date  . EYE SURGERY    . FINGER SURGERY      No family history on file.  Social History   Socioeconomic History  . Marital status: Single    Spouse name: Not on file  . Number of children: Not on file  . Years of education: Not on file  . Highest education level: Not on file  Occupational History  . Not on file  Tobacco Use  . Smoking status: Former Smoker    Packs/day: 0.50    Types: Cigarettes  . Smokeless tobacco: Never Used  Vaping Use  . Vaping Use: Never used  Substance and Sexual Activity    . Alcohol use: Not Currently  . Drug use: No  . Sexual activity: Yes    Birth control/protection: Injection, None  Other Topics Concern  . Not on file  Social History Narrative  . Not on file   Social Determinants of Health   Financial Resource Strain:   . Difficulty of Paying Living Expenses:   Food Insecurity:   . Worried About Charity fundraiser in the Last Year:   . Arboriculturist in the Last Year:   Transportation Needs:   . Film/video editor (Medical):   Marland Kitchen Lack of Transportation (Non-Medical):   Physical Activity:   . Days of Exercise per Week:   . Minutes of Exercise per Session:   Stress:   . Feeling of Stress :   Social Connections:   . Frequency of Communication with Friends and Family:   . Frequency of Social Gatherings with Friends and Family:   . Attends Religious Services:   . Active Member of Clubs or Organizations:   . Attends Archivist Meetings:   Marland Kitchen Marital Status:   Intimate Partner Violence:   . Fear of Current or Ex-Partner:   . Emotionally Abused:   Marland Kitchen Physically Abused:   . Sexually Abused:     Outpatient Medications Prior to Visit  Medication Sig Dispense Refill  . cetirizine (ZYRTEC ALLERGY) 10 MG tablet Take 1 tablet (10 mg total) by mouth daily. 30 tablet 0  . fluticasone (FLONASE) 50 MCG/ACT nasal spray Place 1 spray into both nostrils daily. 11.1 mL 2  . hydrOXYzine (ATARAX/VISTARIL) 25 MG tablet Take 1 tablet (25 mg total) by mouth every 6 (six) hours as needed for anxiety. 30 tablet 0  . iron polysaccharides (NIFEREX) 150 MG capsule Take 1 capsule (150 mg total) by mouth 2 (two) times daily. (Patient not taking: Reported on 11/28/2019) 60 capsule 5  . Norethindrone Acetate-Ethinyl Estrad-FE (LOESTRIN 24 FE) 1-20 MG-MCG(24) tablet Take 1 tablet by mouth daily. (Patient not taking: Reported on 11/28/2019) 1 Package 11  . polyethylene glycol (MIRALAX / GLYCOLAX) 17 g packet Take 17 g by mouth daily. (Patient not taking: Reported  on 11/28/2019)    . Prenatal Vit w/Fe-Methylfol-FA (PNV PO) Take by mouth. (Patient not taking: Reported on 11/28/2019)     No facility-administered medications prior to visit.    No Known Allergies  ROS Review of Systems  Constitutional: Negative.   HENT: Negative.   Eyes: Negative.   Cardiovascular: Negative.   Gastrointestinal: Negative.   Endocrine: Negative.   Genitourinary: Negative.   Musculoskeletal: Negative.   Skin: Negative.   Allergic/Immunologic: Negative.   Neurological: Negative.   Hematological: Negative.   Psychiatric/Behavioral: Positive for dysphoric mood and sleep disturbance. Negative for agitation, behavioral problems, confusion, decreased concentration, hallucinations, self-injury and suicidal ideas. The patient is nervous/anxious. The patient is not hyperactive.   All other systems reviewed and are negative.     Objective:    Physical Exam Vitals and nursing note reviewed.  Constitutional:      General: She is not in acute distress.    Appearance: Normal appearance. She is obese. She is not ill-appearing, toxic-appearing or diaphoretic.  Cardiovascular:     Rate and Rhythm: Normal rate and regular rhythm.  Pulmonary:     Effort: Pulmonary effort is normal. No respiratory distress.  Skin:    General: Skin is warm and dry.  Neurological:     General: No focal deficit present.     Mental Status: She is alert and oriented to person, place, and time. Mental status is at baseline.  Psychiatric:        Mood and Affect: Mood normal.        Behavior: Behavior normal.        Thought Content: Thought content normal.        Judgment: Judgment normal.     BP 117/75 (BP Location: Right Arm, Patient Position: Sitting, Cuff Size: Normal)   Pulse 82   Temp 97.7 F (36.5 C) (Temporal)   Ht 5\' 6"  (1.676 m)   Wt 201 lb 6.4 oz (91.4 kg)   LMP 11/06/2019   SpO2 100%   BMI 32.51 kg/m  Wt Readings from Last 3 Encounters:  11/28/19 201 lb 6.4 oz (91.4 kg)    11/14/19 199 lb (90.3 kg)  06/16/19 200 lb 6.4 oz (90.9 kg)     Health Maintenance Due  Topic Date Due  . COVID-19 Vaccine (1) Never done    There are no preventive care reminders to display for this patient.  Lab Results  Component Value Date   TSH 0.977 06/16/2019   Lab Results  Component Value Date   WBC 7.5 06/16/2019   HGB 11.9 06/16/2019   HCT 35.9 06/16/2019   MCV 82 06/16/2019   PLT 313 06/16/2019  Lab Results  Component Value Date   NA 137 06/16/2019   K 3.8 06/16/2019   CO2 20 06/16/2019   GLUCOSE 90 06/16/2019   BUN 11 06/16/2019   CREATININE 0.73 06/16/2019   BILITOT 0.4 06/16/2019   ALKPHOS 60 06/16/2019   AST 18 06/16/2019   ALT 12 06/16/2019   PROT 7.2 06/16/2019   ALBUMIN 4.1 06/16/2019   CALCIUM 9.4 06/16/2019   ANIONGAP 7 02/23/2019   No results found for: CHOL No results found for: HDL No results found for: LDLCALC No results found for: TRIG No results found for: CHOLHDL No results found for: HGBA1C    Assessment & Plan:   Problem List Items Addressed This Visit      Other   Iron deficiency anemia due to chronic blood loss (Chronic)   Relevant Orders   Iron, TIBC and Ferritin Panel   CBC with Differential    Other Visit Diagnoses    GAD (generalized anxiety disorder)    -  Primary   Relevant Medications   FLUoxetine (PROZAC) 10 MG capsule   FLUoxetine (PROZAC) 20 MG capsule   traZODone (DESYREL) 50 MG tablet   Panic attacks       Relevant Medications   FLUoxetine (PROZAC) 10 MG capsule   FLUoxetine (PROZAC) 20 MG capsule   traZODone (DESYREL) 50 MG tablet   Psychophysiologic insomnia       Relevant Medications   FLUoxetine (PROZAC) 10 MG capsule   FLUoxetine (PROZAC) 20 MG capsule   traZODone (DESYREL) 50 MG tablet      Meds ordered this encounter  Medications  . FLUoxetine (PROZAC) 10 MG capsule    Sig: Take 1 capsule (10 mg total) by mouth daily.    Dispense:  7 capsule    Refill:  0    Order Specific  Question:   Supervising Provider    AnswerRutherford Guys [[id number]]  . FLUoxetine (PROZAC) 20 MG capsule    Sig: Take 1 capsule (20 mg total) by mouth daily.    Dispense:  90 capsule    Refill:  0    Order Specific Question:   Supervising Provider    Answer:   Rutherford Guys [[id number]]  . traZODone (DESYREL) 50 MG tablet    Sig: Take 0.5-1 tablets (25-50 mg total) by mouth at bedtime as needed for sleep.    Dispense:  30 tablet    Refill:  3    Order Specific Question:   Supervising Provider    AnswerRutherford Guys [[id number]]    Follow-up: Return in about 6 weeks (around 01/09/2020) for 6-8 weeks for med check w provider - fluoxetine.   PLAN  Start fluoxetine 10mg  PO qd, then increase to 20mg  PO qd after one week. Maintain this dose for 4 weeks. Then call if hoping to increase again.  Start trazodone 25-50mg  PO qhs PRN for sleep aid  Will refer to behavioral health  Labs collected, will follow up as warranted  Patient encouraged to call clinic with any questions, comments, or concerns.  [name], NP

## 2019-11-29 ENCOUNTER — Encounter: Payer: Self-pay | Admitting: Registered Nurse

## 2019-11-29 LAB — IRON,TIBC AND FERRITIN PANEL
Ferritin: 8 ng/mL — ABNORMAL LOW (ref 15–150)
Iron Saturation: 7 % — CL (ref 15–55)
Iron: 23 ug/dL — ABNORMAL LOW (ref 27–159)
Total Iron Binding Capacity: 346 ug/dL (ref 250–450)
UIBC: 323 ug/dL (ref 131–425)

## 2019-11-29 LAB — CBC WITH DIFFERENTIAL/PLATELET
Basophils Absolute: 0 10*3/uL (ref 0.0–0.2)
Basos: 0 %
EOS (ABSOLUTE): 0.1 10*3/uL (ref 0.0–0.4)
Eos: 3 %
Hematocrit: 34.1 % (ref 34.0–46.6)
Hemoglobin: 11.2 g/dL (ref 11.1–15.9)
Immature Grans (Abs): 0 10*3/uL (ref 0.0–0.1)
Immature Granulocytes: 0 %
Lymphocytes Absolute: 1.8 10*3/uL (ref 0.7–3.1)
Lymphs: 35 %
MCH: 26.9 pg (ref 26.6–33.0)
MCHC: 32.8 g/dL (ref 31.5–35.7)
MCV: 82 fL (ref 79–97)
Monocytes Absolute: 0.4 10*3/uL (ref 0.1–0.9)
Monocytes: 7 %
Neutrophils Absolute: 2.8 10*3/uL (ref 1.4–7.0)
Neutrophils: 55 %
Platelets: 271 10*3/uL (ref 150–450)
RBC: 4.16 x10E6/uL (ref 3.77–5.28)
RDW: 14.5 % (ref 11.7–15.4)
WBC: 5.1 10*3/uL (ref 3.4–10.8)

## 2019-12-07 ENCOUNTER — Encounter (HOSPITAL_COMMUNITY): Payer: Self-pay

## 2019-12-07 ENCOUNTER — Emergency Department (HOSPITAL_COMMUNITY)
Admission: EM | Admit: 2019-12-07 | Discharge: 2019-12-07 | Disposition: A | Discharge status: Home / Self Care | Payer: Medicaid Other | Attending: Emergency Medicine | Admitting: Emergency Medicine

## 2019-12-07 ENCOUNTER — Emergency Department (HOSPITAL_COMMUNITY): Payer: Medicaid Other

## 2019-12-07 DIAGNOSIS — R519 Headache, unspecified: Secondary | ICD-10-CM | POA: Diagnosis not present

## 2019-12-07 DIAGNOSIS — R05 Cough: Secondary | ICD-10-CM | POA: Insufficient documentation

## 2019-12-07 DIAGNOSIS — Z87891 Personal history of nicotine dependence: Secondary | ICD-10-CM | POA: Insufficient documentation

## 2019-12-07 DIAGNOSIS — R110 Nausea: Secondary | ICD-10-CM | POA: Insufficient documentation

## 2019-12-07 DIAGNOSIS — R430 Anosmia: Secondary | ICD-10-CM | POA: Insufficient documentation

## 2019-12-07 DIAGNOSIS — R001 Bradycardia, unspecified: Secondary | ICD-10-CM | POA: Diagnosis not present

## 2019-12-07 DIAGNOSIS — Z20822 Contact with and (suspected) exposure to covid-19: Secondary | ICD-10-CM | POA: Diagnosis not present

## 2019-12-07 DIAGNOSIS — R070 Pain in throat: Secondary | ICD-10-CM | POA: Diagnosis not present

## 2019-12-07 DIAGNOSIS — R0982 Postnasal drip: Secondary | ICD-10-CM | POA: Diagnosis not present

## 2019-12-07 DIAGNOSIS — J3489 Other specified disorders of nose and nasal sinuses: Secondary | ICD-10-CM | POA: Diagnosis not present

## 2019-12-07 DIAGNOSIS — R5383 Other fatigue: Secondary | ICD-10-CM | POA: Insufficient documentation

## 2019-12-07 DIAGNOSIS — R0602 Shortness of breath: Secondary | ICD-10-CM | POA: Insufficient documentation

## 2019-12-07 DIAGNOSIS — R439 Unspecified disturbances of smell and taste: Secondary | ICD-10-CM | POA: Insufficient documentation

## 2019-12-07 LAB — SARS CORONAVIRUS 2 (TAT 6-24 HRS): SARS Coronavirus 2: NEGATIVE

## 2019-12-07 MED ORDER — AEROCHAMBER PLUS FLO-VU MISC
2 refills | Status: DC
Start: 2019-12-07 — End: 2020-01-09

## 2019-12-07 MED ORDER — ALBUTEROL SULFATE HFA 108 (90 BASE) MCG/ACT IN AERS
2.0000 | INHALATION_SPRAY | RESPIRATORY_TRACT | 0 refills | Status: DC | PRN
Start: 2019-12-07 — End: 2020-03-25

## 2019-12-07 MED ORDER — PROMETHAZINE-DM 6.25-15 MG/5ML PO SYRP
5.0000 mL | ORAL_SOLUTION | Freq: Four times a day (QID) | ORAL | 0 refills | Status: DC | PRN
Start: 2019-12-07 — End: 2020-01-09

## 2019-12-07 NOTE — ED Provider Notes (Signed)
[facility] DEPT Provider Note   CSN: [id number] Arrival date & time: 12/07/19  0102     History No chief complaint on file.   Katie Powell is a 29 y.o. female with a hx of anemia, anxiety presents to the Emergency Department complaining of gradual, persistent, progressively worsening URI symptoms onset 2 days ago. Associated symptoms include persistent cough, mild shortness of breath, loss of taste and smell, rhinorrhea and nausea.  She also reports mild, intermittent and throbbing headache.  No vision changes.  Treatments prior to arrival.  No aggravating or alleviating factors.  Patient is fully vaccinated for Covid.  Denies known sick contacts.  Denies fever, chills, neck pain, neck stiffness, vomiting, diarrhea, weakness, dizziness.  Patient smokes approximately 6 cigarettes/day.   The history is provided by the patient and medical records. No language interpreter was used.       Past Medical History:  Diagnosis Date  . Anemia   . Anxiety   . Establishing care with new doctor, encounter for 03/09/2019    Patient Active Problem List   Diagnosis Date Noted  . Other fatigue 06/16/2019  . Normal vaginal delivery 03/27/2019  . Obstetrical laceration 03/27/2019  . Variable fetal heart rate decelerations, antepartum 03/25/2019  . Encounter for induction of labor 03/25/2019  . Establishing care with new doctor, encounter for 03/09/2019  . 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 Supervision of other normal pregnancy, antepartum 09/06/2018  . Iron deficiency anemia due to chronic blood loss 08/20/2016  . Thrombocytosis ([place]) 08/20/2016    Past Surgical History:  Procedure Laterality Date  . EYE SURGERY    . FINGER SURGERY       OB History    Gravida  2   Para  1   Term  1   Preterm      AB  1   Living  1      SAB  1   TAB  0   Ectopic      Multiple  0   Live Births  1           History reviewed. No pertinent family history.  Social History   Tobacco Use  . Smoking status: Former Smoker    Packs/day: 0.50    Types: Cigarettes  . Smokeless tobacco: Never Used  Vaping Use  . Vaping Use: Never used  Substance Use Topics  . Alcohol use: Not Currently  . Drug use: No    Home Medications Prior to Admission medications   Medication Sig Start Date End Date Taking? Authorizing Provider  FLUoxetine (PROZAC) 20 MG capsule Take 1 capsule (20 mg total) by mouth daily. 11/28/19  Yes [name], NP  fluticasone (FLONASE) 50 MCG/ACT nasal spray Place 1 spray into both nostrils daily. 09/29/19  Yes Darr, Marguerita Beards, PA-C  hydrOXYzine (ATARAX/VISTARIL) 25 MG tablet Take 1 tablet (25 mg total) by mouth every 6 (six) hours as needed for anxiety. 11/14/19  Yes Ward, Delice Bison, DO  traZODone (DESYREL) 50 MG tablet Take 0.5-1 tablets (25-50 mg total) by mouth at bedtime as needed for sleep. 11/28/19  Yes [name], NP  albuterol (VENTOLIN HFA) 108 (90 Base) MCG/ACT inhaler Inhale 2 puffs into the lungs every 2 (two) hours as needed for wheezing or shortness of breath (cough). 12/07/19   Shahidah Nesbitt, Jarrett Soho, PA-C  cetirizine (ZYRTEC ALLERGY) 10 MG tablet Take  1 tablet (10 mg total) by mouth daily. Patient not taking: Reported on 12/07/2019 09/29/19   Darr, Marguerita Beards, PA-C  FLUoxetine (PROZAC) 10 MG capsule Take 1 capsule (10 mg total) by mouth daily. Patient not taking: Reported on 12/07/2019 11/28/19   [name], NP  iron polysaccharides (NIFEREX) 150 MG capsule Take 1 capsule (150 mg total) by mouth 2 (two) times daily. Patient not taking: Reported on 11/28/2019 11/07/18   Brunetta Genera, MD  Norethindrone Acetate-Ethinyl Estrad-FE (LOESTRIN 24 FE) 1-20 MG-MCG(24) tablet Take 1 tablet by mouth daily. Patient not taking: Reported on 11/28/2019 04/27/19   Osborne Oman, MD  Prenatal Vit  w/Fe-Methylfol-FA (PNV PO) Take by mouth. Patient not taking: Reported on 11/28/2019    [provider]  promethazine-dextromethorphan (PROMETHAZINE-DM) 6.25-15 MG/5ML syrup Take 5 mLs by mouth 4 (four) times daily as needed for cough. 12/07/19   Shanzay Hepworth, Jarrett Soho, PA-C  Spacer/Aero-Holding Chambers (AEROCHAMBER PLUS WITH MASK) inhaler Use as instructed 12/07/19   Shelbi Vaccaro, Jarrett Soho, PA-C    Allergies    Patient has no known allergies.  Review of Systems   Review of Systems  Constitutional: Positive for fatigue. Negative for appetite change, diaphoresis, fever and unexpected weight change.  HENT: Positive for congestion, postnasal drip and sore throat. Negative for mouth sores.   Eyes: Negative for visual disturbance.  Respiratory: Positive for cough and shortness of breath. Negative for chest tightness and wheezing.   Cardiovascular: Negative for chest pain.  Gastrointestinal: Positive for nausea. Negative for abdominal pain, constipation, diarrhea and vomiting.  Endocrine: Negative for polydipsia, polyphagia and polyuria.  Genitourinary: Negative for dysuria, frequency, hematuria and urgency.  Musculoskeletal: Negative for back pain and neck stiffness.  Skin: Negative for rash.  Allergic/Immunologic: Negative for immunocompromised state.  Neurological: Positive for headaches. Negative for syncope and light-headedness.  Hematological: Does not bruise/bleed easily.  Psychiatric/Behavioral: Negative for sleep disturbance. The patient is not nervous/anxious.     Physical Exam Updated Vital Signs BP (!) 131/81 (BP Location: Left Arm)   Pulse 49   Temp 98 F (36.7 C) (Oral)   Resp 16   Ht 5\' 6"  (1.676 m)   Wt 91.2 kg   LMP 12/07/2019   SpO2 98%   BMI 32.44 kg/m   Physical Exam Vitals and nursing note reviewed.  Constitutional:      General: She is not in acute distress.    Appearance: She is not diaphoretic.  HENT:     Head: Normocephalic.  Eyes:     General:  No scleral icterus.    Conjunctiva/sclera: Conjunctivae normal.  Cardiovascular:     Rate and Rhythm: Regular rhythm. Bradycardia present.     Pulses: Normal pulses.          Radial pulses are 2+ on the right side and 2+ on the left side.  Pulmonary:     Effort: No tachypnea, accessory muscle usage, prolonged expiration, respiratory distress or retractions.     Breath sounds: No stridor.     Comments: Equal chest rise. No increased work of breathing. Abdominal:     General: There is no distension.     Palpations: Abdomen is soft.     Tenderness: There is no abdominal tenderness. There is no guarding or rebound.  Musculoskeletal:     Cervical back: Normal range of motion.     Comments: Moves all extremities equally and without difficulty.  Skin:    General: Skin is warm and dry.     Capillary Refill: Capillary  refill takes less than 2 seconds.  Neurological:     Mental Status: She is alert.     GCS: GCS eye subscore is 4. GCS verbal subscore is 5. GCS motor subscore is 6.     Comments: Speech is clear and goal oriented.  Psychiatric:        Mood and Affect: Mood normal.     ED Results / Procedures / Treatments   Labs (all labs ordered are listed, but only abnormal results are displayed) Labs Reviewed  SARS CORONAVIRUS 2 (TAT 6-24 HRS)    EKG EKG Interpretation  Date/Time:  Thursday December 07 2019 01:21:27 EDT Ventricular Rate:  67 PR Interval:    QRS Duration: 87 QT Interval:  410 QTC Calculation: 433 R Axis:   45 Text Interpretation: Sinus rhythm Nonspecific T abnormalities, anterior leads no change Confirmed by Randal Buba, April ([id number]) on 12/07/2019 3:31:55 AM   Radiology DG Chest 2 View  Result Date: 12/07/2019 CLINICAL DATA:  Shortness of breath EXAM: CHEST - 2 VIEW COMPARISON:  01/11/2017 FINDINGS: The heart size and mediastinal contours are within normal limits. Both lungs are clear. The visualized skeletal structures are unremarkable. IMPRESSION: No active  cardiopulmonary disease. Electronically Signed   By: Inez Catalina M.D.   On: 12/07/2019 01:47    Procedures Procedures (including critical care time)  Medications Ordered in ED Medications - No data to display  ED Course  I have reviewed the triage vital signs and the nursing notes.  Pertinent labs & imaging results that were available during my care of the patient were reviewed by me and considered in my medical decision making (see chart for details).    MDM Rules/Calculators/A&P                           Patient presents with Covid-like illness.  Patient is vaccinated.  Vital signs are reassuring.  Patient is afebrile without tachycardia or hypoxia.  Chest x-ray without groundglass opacities.  EKG without acute ischemic abnormalities.  Covid test pending.  Symptomatic treatment.  Patient will need to isolate until her Covid test returns.  Discussed reasons to return to the emergency department and follow-up with primary care as needed.  Patient states understanding and is in agreement with the plan.  Katie Powell was evaluated in Emergency Department on 12/07/2019 for the symptoms described in the history of present illness. She was evaluated in the context of the global COVID-19 pandemic, which necessitated consideration that the patient might be at risk for infection with the SARS-CoV-2 virus that causes COVID-19. Institutional protocols and algorithms that pertain to the evaluation of patients at risk for COVID-19 are in a state of rapid change based on information released by regulatory bodies including the CDC and federal and state organizations. These policies and algorithms were followed during the patient's care in the ED.  Final Clinical Impression(s) / ED Diagnoses Final diagnoses:  Suspected COVID-19 virus infection    Rx / DC Orders ED Discharge Orders         Ordered    promethazine-dextromethorphan (PROMETHAZINE-DM) 6.25-15 MG/5ML syrup  4 times daily PRN      Discontinue  Reprint     12/07/19 0550    albuterol (VENTOLIN HFA) 108 (90 Base) MCG/ACT inhaler  Every 2 hours PRN     Discontinue  Reprint     12/07/19 0550    Spacer/Aero-Holding Chambers (AEROCHAMBER PLUS WITH MASK) inhaler     Discontinue  Reprint     12/07/19 0550           Manroop Jakubowicz, Jarrett Soho, PA-C 12/07/19 0551    Palumbo, April, MD 12/07/19 [id number]

## 2019-12-07 NOTE — Discharge Instructions (Signed)
1. Medications: Albuterol, Promethazine DM, alternate tylenol and ibuprofen for fever control, continue usual home medications 2. Treatment: rest, drink plenty of fluids, isolate for the next 10 days 3. Follow Up: Please followup with your primary doctor if your symptoms are not improving after 10-14 days; Please return to the ER for high fevers, persistent vomiting, shortness of breath or other concerns.

## 2019-12-07 NOTE — ED Triage Notes (Signed)
Pt complains of being short of breath but states that she has anxiety and was put on prozac last week, she said yesterday she couldn't taste her food and tonight she was having trouble laying down and has a dry cough

## 2020-01-01 ENCOUNTER — Emergency Department (HOSPITAL_COMMUNITY)
Admission: EM | Admit: 2020-01-01 | Discharge: 2020-01-01 | Disposition: A | Discharge status: Home / Self Care | Payer: Medicaid Other | Attending: Emergency Medicine | Admitting: Emergency Medicine

## 2020-01-01 ENCOUNTER — Encounter (HOSPITAL_COMMUNITY): Payer: Self-pay

## 2020-01-01 ENCOUNTER — Encounter: Payer: Self-pay | Admitting: Registered Nurse

## 2020-01-01 ENCOUNTER — Other Ambulatory Visit: Payer: Self-pay

## 2020-01-01 ENCOUNTER — Emergency Department (HOSPITAL_COMMUNITY): Payer: Medicaid Other

## 2020-01-01 DIAGNOSIS — R0602 Shortness of breath: Secondary | ICD-10-CM | POA: Diagnosis not present

## 2020-01-01 DIAGNOSIS — Z5321 Procedure and treatment not carried out due to patient leaving prior to being seen by health care provider: Secondary | ICD-10-CM | POA: Diagnosis not present

## 2020-01-01 NOTE — ED Notes (Signed)
Pt provided registration w/labels, left [facility]. [employer]

## 2020-01-01 NOTE — Telephone Encounter (Signed)
Pt struggling with panic attacks, pt reports the prozac did not help and seemed to make this worse at times, started using the hydroxyzine again and states its helped but needs a refill and wants this doubled has an appt on 01/09/2020

## 2020-01-01 NOTE — ED Triage Notes (Signed)
Patient arrived stating that she has been having shortness of breath since April. Reports she has been told its anxiety but states its hard for her to do daily activities, patient in no distress in triage.

## 2020-01-02 ENCOUNTER — Other Ambulatory Visit: Payer: Self-pay | Admitting: Registered Nurse

## 2020-01-02 DIAGNOSIS — F410 Panic disorder [episodic paroxysmal anxiety] without agoraphobia: Secondary | ICD-10-CM

## 2020-01-02 MED ORDER — HYDROXYZINE HCL 50 MG PO TABS: 50.0000 mg | ORAL_TABLET | Freq: Three times a day (TID) | ORAL | 0 refills | Status: DC | PRN

## 2020-01-09 ENCOUNTER — Encounter: Payer: Self-pay | Admitting: Registered Nurse

## 2020-01-09 ENCOUNTER — Other Ambulatory Visit: Payer: Self-pay

## 2020-01-09 ENCOUNTER — Ambulatory Visit: Payer: Medicaid Other | Admitting: Registered Nurse

## 2020-01-09 VITALS — BP 119/76 | HR 95 | Temp 98.2°F | Ht 66.0 in | Wt 189.0 lb

## 2020-01-09 DIAGNOSIS — R0989 Other specified symptoms and signs involving the circulatory and respiratory systems: Secondary | ICD-10-CM | POA: Diagnosis not present

## 2020-01-09 MED ORDER — SUCRALFATE 1 GM/10ML PO SUSP: 1.0000 g | Freq: Three times a day (TID) | ORAL | 0 refills | Status: DC

## 2020-01-09 MED ORDER — ALPRAZOLAM 0.5 MG PO TBDP: 0.5000 mg | ORAL_TABLET | Freq: Every evening | ORAL | 0 refills | Status: DC | PRN

## 2020-01-09 MED ORDER — ESOMEPRAZOLE MAGNESIUM 40 MG PO CPDR: 40.0000 mg | DELAYED_RELEASE_CAPSULE | Freq: Every day | ORAL | 3 refills | Status: DC

## 2020-01-09 NOTE — Patient Instructions (Signed)
° ° ° °  If you have lab work done today you will be contacted with your lab results within the next 2 weeks.  If you have not heard from us then please contact us. The fastest way to get your results is to register for My Chart. ° ° °IF you received an x-ray today, you will receive an invoice from [facility]. Please contact [facility] at [phone] with questions or concerns regarding your invoice.  ° °IF you received labwork today, you will receive an invoice from [facility]. Please contact [facility] at [phone] with questions or concerns regarding your invoice.  ° °Our billing staff will not be able to assist you with questions regarding bills from these companies. ° °You will be contacted with the lab results as soon as they are available. The fastest way to get your results is to activate your My Chart account. Instructions are located on the last page of this paperwork. If you have not heard from us regarding the results in 2 weeks, please contact this office. °  ° ° ° °

## 2020-01-09 NOTE — Progress Notes (Signed)
Acute Office Visit  Subjective:    Patient ID: Katie Powell, female    DOB: Nov 25, 1990, 29 y.o.   MRN: [id number]  Chief Complaint  Patient presents with  . Anxiety    f/u (feeling going on since April)  . Anorexia    feels like food is getting stuck in her throat while eatting     HPI Patient is in today for follow up on anxiety  Has noticed that her symptoms occur around her globus sensation and the feelings of choking that she gets when she tries to eat nearly any food. This is a problem that onset a number of months ago and has progressed to a point where she has been afraid to eat. Even some liquids are difficult for her to swallow.  No choking, denies nvd, has had some chest tightness related to anxiety but denies palpitations, chest pain, headaches, dizziness, visual changes.  No other concerns at this time.   Past Medical History:  Diagnosis Date  . Anemia   . Anxiety   . Establishing care with new doctor, encounter for 03/09/2019    Past Surgical History:  Procedure Laterality Date  . EYE SURGERY    . FINGER SURGERY      No family history on file.  Social History   Socioeconomic History  . Marital status: Single    Spouse name: Not on file  . Number of children: Not on file  . Years of education: Not on file  . Highest education level: Not on file  Occupational History  . Not on file  Tobacco Use  . Smoking status: Current Every Day Smoker    Packs/day: 0.50    Types: Cigarettes  . Smokeless tobacco: Never Used  Vaping Use  . Vaping Use: Never used  Substance and Sexual Activity  . Alcohol use: Not Currently  . Drug use: No  . Sexual activity: Yes    Birth control/protection: Injection, None  Other Topics Concern  . Not on file  Social History Narrative  . Not on file   Social Determinants of Health   Financial Resource Strain:   . Difficulty of Paying Living Expenses: Not on file  Food Insecurity:   . Worried About Sales executive in the Last Year: Not on file  . Ran Out of Food in the Last Year: Not on file  Transportation Needs:   . Lack of Transportation (Medical): Not on file  . Lack of Transportation (Non-Medical): Not on file  Physical Activity:   . Days of Exercise per Week: Not on file  . Minutes of Exercise per Session: Not on file  Stress:   . Feeling of Stress : Not on file  Social Connections:   . Frequency of Communication with Friends and Family: Not on file  . Frequency of Social Gatherings with Friends and Family: Not on file  . Attends Religious Services: Not on file  . Active Member of Clubs or Organizations: Not on file  . Attends Archivist Meetings: Not on file  . Marital Status: Not on file  Intimate Partner Violence:   . Fear of Current or Ex-Partner: Not on file  . Emotionally Abused: Not on file  . Physically Abused: Not on file  . Sexually Abused: Not on file    Outpatient Medications Prior to Visit  Medication Sig Dispense Refill  . cetirizine (ZYRTEC ALLERGY) 10 MG tablet Take 1 tablet (10 mg total) by mouth daily. [place]  tablet 0  . fluticasone (FLONASE) 50 MCG/ACT nasal spray Place 1 spray into both nostrils daily. 11.1 mL 2  . hydrOXYzine (ATARAX/VISTARIL) 50 MG tablet Take 1 tablet (50 mg total) by mouth 3 (three) times daily as needed. 60 tablet 0  . traZODone (DESYREL) 50 MG tablet Take 0.5-1 tablets (25-50 mg total) by mouth at bedtime as needed for sleep. 30 tablet 3  . albuterol (VENTOLIN HFA) 108 (90 Base) MCG/ACT inhaler Inhale 2 puffs into the lungs every 2 (two) hours as needed for wheezing or shortness of breath (cough). (Patient not taking: Reported on 01/09/2020) 8 g 0  . FLUoxetine (PROZAC) 10 MG capsule Take 1 capsule (10 mg total) by mouth daily. (Patient not taking: Reported on 12/07/2019) 7 capsule 0  . FLUoxetine (PROZAC) 20 MG capsule Take 1 capsule (20 mg total) by mouth daily. 90 capsule 0  . iron polysaccharides (NIFEREX) 150 MG capsule Take 1  capsule (150 mg total) by mouth 2 (two) times daily. (Patient not taking: Reported on 11/28/2019) 60 capsule 5  . Norethindrone Acetate-Ethinyl Estrad-FE (LOESTRIN 24 FE) 1-20 MG-MCG(24) tablet Take 1 tablet by mouth daily. (Patient not taking: Reported on 11/28/2019) 1 Package 11  . Prenatal Vit w/Fe-Methylfol-FA (PNV PO) Take by mouth. (Patient not taking: Reported on 11/28/2019)    . promethazine-dextromethorphan (PROMETHAZINE-DM) 6.25-15 MG/5ML syrup Take 5 mLs by mouth 4 (four) times daily as needed for cough. 118 mL 0  . Spacer/Aero-Holding Chambers (AEROCHAMBER PLUS WITH MASK) inhaler Use as instructed 1 each 2   No facility-administered medications prior to visit.    No Known Allergies  Review of Systems  Constitutional: Negative.   HENT: Positive for trouble swallowing.   Eyes: Negative.   Respiratory: Positive for chest tightness.   Cardiovascular: Negative.  Negative for chest pain, palpitations and leg swelling.  Gastrointestinal: Positive for abdominal pain.  Endocrine: Negative.   Genitourinary: Negative.   Musculoskeletal: Negative.   Skin: Negative.   Allergic/Immunologic: Negative.   Neurological: Negative.   Hematological: Negative.   Psychiatric/Behavioral: The patient is nervous/anxious.   All other systems reviewed and are negative.      Objective:    Physical Exam Vitals and nursing note reviewed.  Constitutional:      General: She is not in acute distress.    Appearance: Normal appearance. She is obese. She is not ill-appearing, toxic-appearing or diaphoretic.  Cardiovascular:     Rate and Rhythm: Normal rate and regular rhythm.     Heart sounds: Normal heart sounds.  Pulmonary:     Effort: Pulmonary effort is normal. No respiratory distress.     Breath sounds: Normal breath sounds.  Skin:    General: Skin is warm and dry.     Capillary Refill: Capillary refill takes less than 2 seconds.  Neurological:     General: No focal deficit present.      Mental Status: She is alert and oriented to person, place, and time. Mental status is at baseline.  Psychiatric:        Mood and Affect: Mood normal.        Behavior: Behavior normal.        Thought Content: Thought content normal.        Judgment: Judgment normal.     BP 119/76   Pulse 95   Temp 98.2 F (36.8 C) (Temporal)   Ht 5\' 6"  (1.676 m)   Wt 189 lb (85.7 kg)   LMP 12/28/2019   SpO2 97%  BMI 30.51 kg/m  Wt Readings from Last 3 Encounters:  01/09/20 189 lb (85.7 kg)  12/07/19 201 lb (91.2 kg)  11/28/19 201 lb 6.4 oz (91.4 kg)    Health Maintenance Due  Topic Date Due  . COVID-19 Vaccine (1) Never done  . INFLUENZA VACCINE  12/10/2019    There are no preventive care reminders to display for this patient.   Lab Results  Component Value Date   TSH 0.977 06/16/2019   Lab Results  Component Value Date   WBC 5.1 11/28/2019   HGB 11.2 11/28/2019   HCT 34.1 11/28/2019   MCV 82 11/28/2019   PLT 271 11/28/2019   Lab Results  Component Value Date   NA 137 06/16/2019   K 3.8 06/16/2019   CO2 20 06/16/2019   GLUCOSE 90 06/16/2019   BUN 11 06/16/2019   CREATININE 0.73 06/16/2019   BILITOT 0.4 06/16/2019   ALKPHOS 60 06/16/2019   AST 18 06/16/2019   ALT 12 06/16/2019   PROT 7.2 06/16/2019   ALBUMIN 4.1 06/16/2019   CALCIUM 9.4 06/16/2019   ANIONGAP 7 02/23/2019   No results found for: CHOL No results found for: HDL No results found for: LDLCALC No results found for: TRIG No results found for: CHOLHDL No results found for: HGBA1C     Assessment & Plan:   Problem List Items Addressed This Visit    None    Visit Diagnoses    Globus sensation    -  Primary   Relevant Medications   sucralfate (CARAFATE) 1 GM/10ML suspension   esomeprazole (NEXIUM) 40 MG capsule       Meds ordered this encounter  Medications  . sucralfate (CARAFATE) 1 GM/10ML suspension    Sig: Take 10 mLs (1 g total) by mouth 4 (four) times daily -  with meals and at bedtime.     Dispense:  420 mL    Refill:  0    Order Specific Question:   Supervising Provider    Answer:   Carlota Raspberry, JEFFREY R [[id number]]  . esomeprazole (NEXIUM) 40 MG capsule    Sig: Take 1 capsule (40 mg total) by mouth daily.    Dispense:  30 capsule    Refill:  3    Order Specific Question:   Supervising Provider    Answer:   Carlota Raspberry, JEFFREY R [[id number]]   PLAN  Symptoms consistent with GERD. Pt reports history of this. Pt notes that anxiety only occurs with these episodes - feels that treatment of anxiety not warranted beyond PRN while working to control GERD  Esomeprazole 40mg  PO qd  Sucralfate 1gm/39mL four times daily: with meals and before bed  Alprazolam 0.5mg  sublingual PO bid PRN for anxiety  Return precautions reviewed  Patient encouraged to call clinic with any questions, comments, or concerns.  [name], NP

## 2020-01-13 ENCOUNTER — Other Ambulatory Visit: Payer: Self-pay | Admitting: Obstetrics and Gynecology

## 2020-01-13 DIAGNOSIS — R768 Other specified abnormal immunological findings in serum: Secondary | ICD-10-CM

## 2020-01-29 ENCOUNTER — Encounter (HOSPITAL_COMMUNITY): Payer: Self-pay | Admitting: Emergency Medicine

## 2020-01-29 ENCOUNTER — Encounter: Payer: Self-pay | Admitting: Registered Nurse

## 2020-01-29 ENCOUNTER — Other Ambulatory Visit: Payer: Self-pay

## 2020-01-29 ENCOUNTER — Ambulatory Visit (HOSPITAL_COMMUNITY)
Admission: EM | Admit: 2020-01-29 | Discharge: 2020-01-29 | Disposition: A | Discharge status: Home / Self Care | Payer: Medicaid Other | Attending: Family Medicine | Admitting: Family Medicine

## 2020-01-29 DIAGNOSIS — K219 Gastro-esophageal reflux disease without esophagitis: Secondary | ICD-10-CM | POA: Diagnosis not present

## 2020-01-29 MED ORDER — LIDOCAINE VISCOUS HCL 2 % MT SOLN
OROMUCOSAL | Status: AC
  Filled 2020-01-29: qty 15

## 2020-01-29 MED ORDER — LIDOCAINE VISCOUS HCL 2 % MT SOLN
15.0000 mL | Freq: Three times a day (TID) | OROMUCOSAL | 0 refills | Status: DC | PRN
Start: 2020-01-29 — End: 2020-02-02

## 2020-01-29 MED ORDER — LIDOCAINE VISCOUS HCL 2 % MT SOLN
15.0000 mL | Freq: Once | OROMUCOSAL | Status: AC
  Administered 2020-01-29: 15 mL via ORAL

## 2020-01-29 MED ORDER — PANTOPRAZOLE SODIUM 20 MG PO TBEC
20.0000 mg | DELAYED_RELEASE_TABLET | Freq: Two times a day (BID) | ORAL | 1 refills | Status: DC
Start: 2020-01-29 — End: 2020-03-25

## 2020-01-29 MED ORDER — ALUMINUM & MAGNESIUM HYDROXIDE 200-200 MG/5ML PO SUSP: 15.0000 mL | Freq: Three times a day (TID) | ORAL | 0 refills | Status: DC | PRN

## 2020-01-29 MED ORDER — ALUM & MAG HYDROXIDE-SIMETH 200-200-20 MG/5ML PO SUSP
ORAL | Status: AC
  Filled 2020-01-29: qty 30

## 2020-01-29 MED ORDER — ALUM & MAG HYDROXIDE-SIMETH 200-200-20 MG/5ML PO SUSP
30.0000 mL | Freq: Once | ORAL | Status: AC
  Administered 2020-01-29: 30 mL via ORAL

## 2020-01-29 NOTE — Discharge Instructions (Signed)
Medicine as prescribed Referral to GI done

## 2020-01-29 NOTE — ED Provider Notes (Signed)
[facility]    CSN: [id number] Arrival date & time: 01/29/20  4403      History   Chief Complaint Chief Complaint  Patient presents with   Heartburn    HPI Katie Powell is a 29 y.o. female.   Patient is a 29 year old female with past medical history of anemia, anxiety.  She presents today with difficulty swallowing, burning in chest, burping.  This is been an ongoing issue for a while and she has been taking Nexium and just finished prescription for Carafate but symptoms still persist.  Diagnosed with GERD.  Reporting she feels like food gets stuck.  She can feel burning in her chest and throat and has sore throat due to this.  She is scared to eat due to her symptoms.  Denies any nausea, vomiting or diarrhea.  Denies any fevers.     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 Authorizing Provider  albuterol (VENTOLIN HFA) 108 (90 Base) MCG/ACT inhaler Inhale 2 puffs into the lungs every 2 (two) hours as needed for wheezing or shortness of breath (cough). Patient not taking: Reported on 01/09/2020 12/07/19   Muthersbaugh, Jarrett Soho, PA-C  ALPRAZolam (NIRAVAM) 0.5 MG dissolvable tablet Take 1 tablet (0.5 mg total) by mouth at bedtime as needed for anxiety. 01/09/20   [name], NP  aluminum-magnesium hydroxide 200-200 MG/5ML suspension Take 15 mLs by mouth 3 (three) times daily with meals as needed for indigestion. 01/29/20   Loura Halt A, NP  cetirizine (ZYRTEC ALLERGY) 10 MG tablet Take 1 tablet (10 mg total) by mouth daily. 09/29/19   Darr, Marguerita Beards, PA-C  fluticasone (FLONASE) 50 MCG/ACT nasal spray Place 1 spray into both nostrils daily. 09/29/19   Darr, Marguerita Beards, PA-C  hydrOXYzine (ATARAX/VISTARIL) 50 MG tablet Take 1 tablet (50 mg total) by mouth 3 (three) times daily as needed. 01/02/20   [name], NP  lidocaine (XYLOCAINE) 2 % solution Use as directed 15 mLs in the mouth or throat 3 (three) times daily with meals as needed for mouth pain. 01/29/20   Orvan July, NP  pantoprazole (PROTONIX)  20 MG tablet Take 1 tablet (20 mg total) by mouth 2 (two) times daily. 01/29/20   Loura Halt A, NP  sucralfate (CARAFATE) 1 GM/10ML suspension Take 10 mLs (1 g total) by mouth 4 (four) times daily -  with meals and at bedtime. 01/09/20   [name], NP  traZODone (DESYREL) 50 MG tablet Take 0.5-1 tablets (25-50 mg total) by mouth at bedtime as needed for sleep. 11/28/19   [name], NP  esomeprazole (NEXIUM) 40 MG capsule Take 1 capsule (40 mg total) by mouth daily. 01/09/20 01/29/20  [name], NP    Family History History reviewed. No pertinent family history.  Social History Social History   Tobacco Use   Smoking status: Current Every Day Smoker    Packs/day: 0.50    Types: Cigarettes   Smokeless tobacco: Never Used  Vaping Use    Vaping Use: Never used  Substance Use Topics   Alcohol use: Not Currently   Drug use: No     Allergies   Patient has no known allergies.   Review of Systems Review of Systems   Physical Exam Triage Vital Signs ED Triage Vitals  Enc Vitals Group     BP 01/29/20 0845 137/85     Pulse Rate 01/29/20 0845 75     Resp 01/29/20 0845 16     Temp 01/29/20 0845 99.4 F (37.4 C)     Temp Source 01/29/20 0845 Oral     SpO2 01/29/20 0845 98 %     Weight --      Height --      Head Circumference --      Peak Flow --      Pain Score 01/29/20 0844 0     Pain Loc --      Pain Edu? --      Excl. in [place]? --    No data found.  Updated Vital Signs BP 137/85 (BP Location: Right Arm)    Pulse 75    Temp 99.4 F (37.4 C) (Oral)    Resp 16    SpO2 98%   Visual Acuity Right Eye Distance:   Left Eye Distance:   Bilateral Distance:    Right Eye Near:   Left Eye Near:    Bilateral Near:     Physical Exam Vitals and nursing note reviewed.  Constitutional:      General: She is not in acute distress.    Appearance: Normal appearance. She is not ill-appearing, toxic-appearing or diaphoretic.  HENT:     Head: Normocephalic.     Nose: Nose normal.     Mouth/Throat:     Pharynx: Oropharynx is clear.  Eyes:     Conjunctiva/sclera: Conjunctivae normal.  Pulmonary:     Effort: Pulmonary effort is normal.  Musculoskeletal:        General: Normal range of motion.     Cervical back: Normal range of motion.  Skin:    General: Skin is warm and dry.     Findings: No rash.  Neurological:     Mental Status: She is alert.  Psychiatric:        Mood and Affect: Mood normal.      UC Treatments / Results  Labs (all labs ordered are listed, but only abnormal results are displayed) Labs Reviewed - No data to display  EKG   Radiology No results found.  Procedures Procedures (including critical care time)  Medications Ordered in UC Medications  alum & mag hydroxide-simeth  (  MAALOX/MYLANTA) 200-200-20 MG/5ML suspension 30 mL (30 mLs Oral Given 01/29/20 0907)    And  lidocaine (XYLOCAINE) 2 % viscous mouth solution 15 mL (15 mLs Oral Given 01/29/20 0907)    Initial Impression / Assessment and Plan / UC Course  I have reviewed the triage vital signs and the nursing notes.  Pertinent labs & imaging results that were available during my care of the patient were reviewed by me and considered in my medical decision making (see chart for details).     GI cocktail given here in clinic. Will prescribe GI cocktail to use as needed prior to meals so she can eat.  Recommended limit use on this. Had complete relief with this medication in clinic today. We will switch to Protonix twice daily Ambulatory referral to GI Recommended avoid triggers and diet Follow up as needed for continued or worsening symptoms  Final Clinical Impressions(s) / UC Diagnoses   Final diagnoses:  Gastroesophageal reflux disease, unspecified whether esophagitis present     Discharge Instructions     Medicine as prescribed Referral to GI done      ED Prescriptions    Medication Sig Dispense Auth. Provider   lidocaine (XYLOCAINE) 2 % solution Use as directed 15 mLs in the mouth or throat 3 (three) times daily with meals as needed for mouth pain. 100 mL Elma Shands A, NP   pantoprazole (PROTONIX) 20 MG tablet Take 1 tablet (20 mg total) by mouth 2 (two) times daily. 30 tablet Karmon Andis A, NP   aluminum-magnesium hydroxide 200-200 MG/5ML suspension Take 15 mLs by mouth 3 (three) times daily with meals as needed for indigestion. 355 mL Anyeli Hockenbury A, NP     PDMP not reviewed this encounter.   Loura Halt A, NP 01/29/20 [phone]

## 2020-01-29 NOTE — ED Triage Notes (Signed)
Pt presents with difficulty swallowing, burning in chest, burping. States PCP has prescribed nexium, but does not feel like this is working.

## 2020-02-02 ENCOUNTER — Encounter: Payer: Self-pay | Admitting: Registered Nurse

## 2020-02-02 ENCOUNTER — Other Ambulatory Visit: Payer: Self-pay

## 2020-02-02 ENCOUNTER — Ambulatory Visit: Payer: Medicaid Other | Admitting: Registered Nurse

## 2020-02-02 VITALS — BP 126/87 | HR 99 | Temp 98.0°F | Ht 66.0 in | Wt 188.6 lb

## 2020-02-02 DIAGNOSIS — F410 Panic disorder [episodic paroxysmal anxiety] without agoraphobia: Secondary | ICD-10-CM

## 2020-02-02 DIAGNOSIS — R0989 Other specified symptoms and signs involving the circulatory and respiratory systems: Secondary | ICD-10-CM | POA: Diagnosis not present

## 2020-02-02 DIAGNOSIS — R109 Unspecified abdominal pain: Secondary | ICD-10-CM

## 2020-02-02 LAB — POCT URINALYSIS DIP (MANUAL ENTRY)
Bilirubin, UA: NEGATIVE
Blood, UA: NEGATIVE
Glucose, UA: NEGATIVE mg/dL
Ketones, POC UA: NEGATIVE mg/dL
Nitrite, UA: NEGATIVE
Protein Ur, POC: NEGATIVE mg/dL
Spec Grav, UA: 1.025 (ref 1.010–1.025)
Urobilinogen, UA: 0.2 E.U./dL
pH, UA: 6 (ref 5.0–8.0)

## 2020-02-02 MED ORDER — HYDROXYZINE HCL 50 MG PO TABS: 50.0000 mg | ORAL_TABLET | Freq: Three times a day (TID) | ORAL | 0 refills | Status: DC | PRN

## 2020-02-02 MED ORDER — SUCRALFATE 1 GM/10ML PO SUSP: 1.0000 g | Freq: Three times a day (TID) | ORAL | 0 refills | Status: DC

## 2020-02-02 NOTE — Patient Instructions (Signed)
° ° ° °  If you have lab work done today you will be contacted with your lab results within the next 2 weeks.  If you have not heard from us then please contact us. The fastest way to get your results is to register for My Chart. ° ° °IF you received an x-ray today, you will receive an invoice from [facility]. Please contact [facility] at [phone] with questions or concerns regarding your invoice.  ° °IF you received labwork today, you will receive an invoice from [facility]. Please contact [facility] at [phone] with questions or concerns regarding your invoice.  ° °Our billing staff will not be able to assist you with questions regarding bills from these companies. ° °You will be contacted with the lab results as soon as they are available. The fastest way to get your results is to activate your My Chart account. Instructions are located on the last page of this paperwork. If you have not heard from us regarding the results in 2 weeks, please contact this office. °  ° ° ° °

## 2020-02-04 LAB — URINE CULTURE

## 2020-02-16 ENCOUNTER — Other Ambulatory Visit: Payer: Medicaid Other

## 2020-02-16 DIAGNOSIS — Z20822 Contact with and (suspected) exposure to covid-19: Secondary | ICD-10-CM

## 2020-02-18 LAB — SARS-COV-2, NAA 2 DAY TAT

## 2020-02-18 LAB — NOVEL CORONAVIRUS, NAA: SARS-CoV-2, NAA: NOT DETECTED

## 2020-02-24 ENCOUNTER — Other Ambulatory Visit: Payer: Self-pay | Admitting: Registered Nurse

## 2020-02-24 DIAGNOSIS — R0989 Other specified symptoms and signs involving the circulatory and respiratory systems: Secondary | ICD-10-CM

## 2020-02-24 DIAGNOSIS — F410 Panic disorder [episodic paroxysmal anxiety] without agoraphobia: Secondary | ICD-10-CM

## 2020-02-26 ENCOUNTER — Other Ambulatory Visit: Payer: Self-pay | Admitting: Registered Nurse

## 2020-02-26 DIAGNOSIS — F410 Panic disorder [episodic paroxysmal anxiety] without agoraphobia: Secondary | ICD-10-CM

## 2020-02-26 DIAGNOSIS — R0989 Other specified symptoms and signs involving the circulatory and respiratory systems: Secondary | ICD-10-CM

## 2020-02-26 MED ORDER — SUCRALFATE 1 GM/10ML PO SUSP: 1.0000 g | Freq: Three times a day (TID) | ORAL | 0 refills | Status: DC

## 2020-02-26 MED ORDER — HYDROXYZINE HCL 50 MG PO TABS: 50.0000 mg | ORAL_TABLET | Freq: Three times a day (TID) | ORAL | 0 refills | Status: DC | PRN

## 2020-02-26 NOTE — Addendum Note (Signed)
Addended by: Linus Orn A on: 02/26/2020 02:34 PM   Modules accepted: Orders

## 2020-02-26 NOTE — Telephone Encounter (Signed)
PT need a refill  hydrOXYzine (ATARAX/VISTARIL) 50 MG tablet [[id number]]   sucralfate (CARAFATE) 1 GM/10ML suspension [[id number]] Asking for two bottles  Katie Powell [facility] [address], [place]  [name], [place] [address]  Phone:  [phone] Fax:  [phone]

## 2020-02-27 MED ORDER — SUCRALFATE 1 GM/10ML PO SUSP: 1.0000 g | Freq: Three times a day (TID) | ORAL | 0 refills | Status: DC

## 2020-02-27 MED ORDER — HYDROXYZINE HCL 50 MG PO TABS: 50.0000 mg | ORAL_TABLET | Freq: Three times a day (TID) | ORAL | 0 refills | Status: DC | PRN

## 2020-03-25 ENCOUNTER — Encounter: Payer: Self-pay | Admitting: Gastroenterology

## 2020-03-25 ENCOUNTER — Ambulatory Visit (INDEPENDENT_AMBULATORY_CARE_PROVIDER_SITE_OTHER): Payer: Medicaid Other | Admitting: Gastroenterology

## 2020-03-25 VITALS — BP 92/72 | HR 79 | Ht 66.0 in | Wt 186.4 lb

## 2020-03-25 DIAGNOSIS — R1310 Dysphagia, unspecified: Secondary | ICD-10-CM

## 2020-03-25 DIAGNOSIS — R0989 Other specified symptoms and signs involving the circulatory and respiratory systems: Secondary | ICD-10-CM

## 2020-03-25 DIAGNOSIS — R198 Other specified symptoms and signs involving the digestive system and abdomen: Secondary | ICD-10-CM

## 2020-03-25 DIAGNOSIS — K219 Gastro-esophageal reflux disease without esophagitis: Secondary | ICD-10-CM

## 2020-03-25 MED ORDER — PANTOPRAZOLE SODIUM 40 MG PO TBEC: 40.0000 mg | DELAYED_RELEASE_TABLET | Freq: Two times a day (BID) | ORAL | 2 refills | Status: DC

## 2020-03-25 MED ORDER — SUCRALFATE 1 GM/10ML PO SUSP: 1.0000 g | Freq: Three times a day (TID) | ORAL | 0 refills | Status: DC

## 2020-03-25 NOTE — Progress Notes (Signed)
Katie Powell    [id number]    1990/08/14  Primary Care Physician:Morrow, Delfino Lovett, NP  Referring Physician: [name], NP [place],  [place] [address]   Chief complaint: Dysphagia, GERD  HPI: 29 year old very pleasant female here for new patient visit with complaints of recent onset dysphagia and worsening GERD symptoms  Her symptoms started sometime in March or April, she felt [employer] side effects getting hung up in her throat when she was eating and since then she has been having trouble eating any meat or chunky foods.  Currently she is only tolerating liquids and very soft diet like mashed potatoes.  She has chest tightness and also worsening reflux symptoms when she lays down at nighttime.  Has excessive gas and belching.  Denies any melena or rectal bleeding.  No family history of GI malignancy.    Outpatient Encounter Medications as of 03/25/2020  Medication Sig  . aluminum-magnesium hydroxide 200-200 MG/5ML suspension Take 15 mLs by mouth 3 (three) times daily with meals as needed for indigestion.  . ferrous sulfate 325 (65 FE) MG tablet Take 325 mg by mouth 2 (two) times daily with a meal.  . fluticasone (FLONASE) 50 MCG/ACT nasal spray Place 1 spray into both nostrils daily.  . hydrOXYzine (ATARAX/VISTARIL) 50 MG tablet Take 1 tablet (50 mg total) by mouth 3 (three) times daily as needed.  . hydrOXYzine (ATARAX/VISTARIL) 50 MG tablet Take 1 tablet (50 mg total) by mouth 3 (three) times daily as needed.  . pantoprazole (PROTONIX) 20 MG tablet Take 1 tablet (20 mg total) by mouth 2 (two) times daily.  . sucralfate (CARAFATE) 1 GM/10ML suspension Take 10 mLs (1 g total) by mouth 4 (four) times daily -  with meals and at bedtime.  . sucralfate (CARAFATE) 1 GM/10ML suspension Take 10 mLs (1 g total) by mouth 4 (four) times daily -  with meals and at bedtime.  . [DISCONTINUED] albuterol (VENTOLIN HFA) 108 (90 Base) MCG/ACT inhaler Inhale 2  puffs into the lungs every 2 (two) hours as needed for wheezing or shortness of breath (cough).  . [DISCONTINUED] cetirizine (ZYRTEC ALLERGY) 10 MG tablet Take 1 tablet (10 mg total) by mouth daily.  . [DISCONTINUED] esomeprazole (NEXIUM) 40 MG capsule Take 1 capsule (40 mg total) by mouth daily.  . [DISCONTINUED] traZODone (DESYREL) 50 MG tablet Take 0.5-1 tablets (25-50 mg total) by mouth at bedtime as needed for sleep.   No facility-administered encounter medications on file as of 03/25/2020.    Allergies as of 03/25/2020  . (No Known Allergies)    Past Medical History:  Diagnosis Date  . Anemia   . Anxiety   . Establishing care with new doctor, encounter for 03/09/2019  . GERD (gastroesophageal reflux disease)     Past Surgical History:  Procedure Laterality Date  . EYE SURGERY    . FINGER SURGERY      Family History  Adopted: Yes    Social History   Socioeconomic History  . Marital status: Single    Spouse name: Not on file  . Number of children: Not on file  . Years of education: Not on file  . Highest education level: Not on file  Occupational History  . Not on file  Tobacco Use  . Smoking status: Current Every Day Smoker    Packs/day: 0.50    Types: Cigarettes  . Smokeless tobacco: Never Used  Vaping Use  . Vaping Use:  Never used  Substance and Sexual Activity  . Alcohol use: Not Currently  . Drug use: No  . Sexual activity: Yes    Birth control/protection: Injection, None  Other Topics Concern  . Not on file  Social History Narrative  . Not on file   Social Determinants of Health   Financial Resource Strain:   . Difficulty of Paying Living Expenses: Not on file  Food Insecurity:   . Worried About Charity fundraiser in the Last Year: Not on file  . Ran Out of Food in the Last Year: Not on file  Transportation Needs:   . Lack of Transportation (Medical): Not on file  . Lack of Transportation (Non-Medical): Not on file  Physical Activity:   .  Days of Exercise per Week: Not on file  . Minutes of Exercise per Session: Not on file  Stress:   . Feeling of Stress : Not on file  Social Connections:   . Frequency of Communication with Friends and Family: Not on file  . Frequency of Social Gatherings with Friends and Family: Not on file  . Attends Religious Services: Not on file  . Active Member of Clubs or Organizations: Not on file  . Attends Archivist Meetings: Not on file  . Marital Status: Not on file  Intimate Partner Violence:   . Fear of Current or Ex-Partner: Not on file  . Emotionally Abused: Not on file  . Physically Abused: Not on file  . Sexually Abused: Not on file      Review of systems: All other review of systems negative except as mentioned in the HPI.   Physical Exam: Vitals:   03/25/20 1354  BP: 92/72  Pulse: 79  SpO2: 99%   Body mass index is 30.09 kg/m. Gen:      No acute distress HEENT:  sclera anicteric Abd:      soft, non-tender; no palpable masses, no distension Ext:    No edema Neuro: alert and oriented x 3 Psych: normal mood and affect  Data Reviewed:  Reviewed labs, radiology imaging, old records and pertinent past GI work up   Assessment and Plan/Recommendations:  29 year old female with complaints of recent onset GERD symptoms and dysphagia  Dysphagia: Proceed with EGD to exclude erosive esophagitis, eosinophilic esophagitis, peptic stricture or less likely neoplastic lesion.  Plan for esophageal biopsies and dilation as needed The risks and benefits as well as alternatives of endoscopic procedure(s) have been discussed and reviewed. All questions answered. The patient agrees to proceed. Advised patient to maintain adequate hydration with liquids and continue with soft diet until EGD  GERD: Pantoprazole 40 mg twice daily, 30 minutes before breakfast and dinner Use Carafate suspension 1 g up to 4 times daily as needed  Return after procedure for follow-up as  needed   The patient was provided an opportunity to ask questions and all were answered. The patient agreed with the plan and demonstrated an understanding of the instructions.  Damaris Hippo , MD    CC: [name], NP

## 2020-03-25 NOTE — Patient Instructions (Signed)
If you are age 27 or older, your body mass index should be between 23-30. Your Body mass index is 30.09 kg/m. If this is out of the aforementioned range listed, please consider follow up with your Primary Care Provider.  If you are age 1 or younger, your body mass index should be between 19-25. Your Body mass index is 30.09 kg/m. If this is out of the aformentioned range listed, please consider follow up with your Primary Care Provider.   You have been scheduled for an endoscopy. Please follow written instructions given to you at your visit today. If you use inhalers (even only as needed), please bring them with you on the day of your procedure.   We have sent the following medications to your pharmacy for you to pick up at your convenience: Pantoprazole 40 mg: Take twice daily Carafate suspension: take 1 gram up to 4 times a day as needed  Continue liquids and soft diet as tolerated.  I appreciate the opportunity to care for you. Thank you for choosing me and [facility],  Dr. Harl Bowie

## 2020-03-27 ENCOUNTER — Encounter: Payer: Self-pay | Admitting: Gastroenterology

## 2020-04-01 ENCOUNTER — Ambulatory Visit (AMBULATORY_SURGERY_CENTER): Payer: Medicaid Other | Admitting: Gastroenterology

## 2020-04-01 ENCOUNTER — Other Ambulatory Visit: Payer: Self-pay

## 2020-04-01 ENCOUNTER — Encounter: Payer: Self-pay | Admitting: Gastroenterology

## 2020-04-01 ENCOUNTER — Other Ambulatory Visit: Payer: Self-pay | Admitting: Gastroenterology

## 2020-04-01 VITALS — BP 116/62 | HR 62 | Temp 97.1°F | Resp 20 | Ht 66.0 in | Wt 186.0 lb

## 2020-04-01 DIAGNOSIS — R1310 Dysphagia, unspecified: Secondary | ICD-10-CM

## 2020-04-01 MED ORDER — PANTOPRAZOLE SODIUM 40 MG PO TBEC: 40.0000 mg | DELAYED_RELEASE_TABLET | Freq: Two times a day (BID) | ORAL | 3 refills | Status: DC

## 2020-04-01 MED ORDER — SUCRALFATE 1 G PO TABS: 1.0000 g | ORAL_TABLET | Freq: Four times a day (QID) | ORAL | 0 refills | Status: DC | PRN

## 2020-04-01 MED ORDER — SODIUM CHLORIDE 0.9 % IV SOLN: 500.0000 mL | Freq: Once | INTRAVENOUS | Status: AC

## 2020-04-01 MED ORDER — LIDOCAINE VISCOUS HCL 2 % MT SOLN: 15.0000 mL | OROMUCOSAL | 0 refills | Status: DC | PRN

## 2020-04-01 NOTE — Progress Notes (Signed)
pt tolerated well. VSS. awake and to recovery. Report given to RN. Bite block inserted without trauma. Left in place to recovery.

## 2020-04-01 NOTE — Progress Notes (Signed)
VS by CW. ?

## 2020-04-01 NOTE — Op Note (Addendum)
[facility] Patient Name: Katie Powell Procedure Date: 04/01/2020 2:22 PM MRN: [id number] Endoscopist: Mauri Pole , MD Age: 29 Referring MD:  Date of Birth: June 20, 1990 Gender: Female Account #: [id number] Procedure:                Upper GI endoscopy Indications:              Dysphagia Medicines:                Monitored Anesthesia Care Procedure:                Pre-Anesthesia Assessment:                           - Prior to the procedure, a History and Physical                            was performed, and patient medications and                            allergies were reviewed. The patient's tolerance of                            previous anesthesia was also reviewed. The risks                            and benefits of the procedure and the sedation                            options and risks were discussed with the patient.                            All questions were answered, and informed consent                            was obtained. Prior Anticoagulants: The patient has                            taken no previous anticoagulant or antiplatelet                            agents. ASA Grade Assessment: II - A patient with                            mild systemic disease. After reviewing the risks                            and benefits, the patient was deemed in                            satisfactory condition to undergo the procedure.                           After obtaining informed consent, the endoscope was  passed under direct vision. Throughout the                            procedure, the patient's blood pressure, pulse, and                            oxygen saturations were monitored continuously. The                            Endoscope was introduced through the mouth, and                            advanced to the second part of duodenum. The upper                            GI endoscopy was accomplished without  difficulty.                            The patient tolerated the procedure well. Scope In: Scope Out: Findings:                 The Z-line was regular and was found 36 cm from the                            incisors.                           No endoscopic abnormality was evident in the                            esophagus to explain the patient's complaint of                            dysphagia. It was decided, however, to proceed with                            dilation of the entire esophagus. The scope was                            withdrawn. Dilation was performed with a Maloney                            dilator with no resistance at 48 Fr and mild                            resistance at 54 Fr. 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 Biopsies were obtained from                            the proximal and distal esophagus with cold forceps                            for histology of suspected eosinophilic esophagitis.                           The gastroesophageal flap valve was visualized                            endoscopically and classified as Hill Grade II                            (fold present, opens with respiration).                           The stomach was normal.                           The examined duodenum was normal. Complications:            No immediate complications. Estimated Blood Loss:     Estimated blood loss was minimal. Impression:               - Z-line regular, 36 cm from the incisors.                           - No endoscopic esophageal abnormality to explain                            patient's dysphagia. Esophagus dilated. Dilated.                            Biopsied.                           - Gastroesophageal flap valve classified as Hill                             Grade II (fold present, opens with respiration).                           - Normal stomach.                           - Normal examined duodenum. Recommendation:           - Patient has a contact number available for                            emergencies. The signs and symptoms of potential                            delayed complications were discussed with the  patient. Return to normal activities tomorrow.                            Written discharge instructions were provided to the                            patient.                           - Clear liquid diet - advance as tolerated to                            mechanical soft diet tomorrow                           - Continue present medications.                           - No high dose aspirin, ibuprofen, naproxen, or                            other non-steroidal anti-inflammatory drugs.                           - Use Protonix (pantoprazole) 40 mg PO BID.                           - Use sucralfate tablets 1 gram PO QID PRN.                           - Use Viscous Lidocaine at 2% 5 mL PO q 4 hrs PRN X                            1 week.                           - Return to GI office in 2 months, please call to                            schedule appointment. Mauri Pole, MD 04/01/2020 2:50:23 PM This report has been signed electronically.

## 2020-04-01 NOTE — Patient Instructions (Signed)
Handout given:  Post dilation diet CLEAR LIQUID TIL 4:30- THEN ADVANCE TO FULL LIQUID FOR REST OF TODAY TOMORROW YOU MAY HAVE A SOFT DIET Use protonix  40mg  po twice daily Use sucralfate1 gram four times daily as needed Use viscous lidocaine (at 2%) 50ml by mouth every 4 hours as needed for 1 week  YOU HAD AN ENDOSCOPIC PROCEDURE TODAY AT [facility]:   Refer to the procedure report that was given to you for any specific questions about what was found during the examination.  If the procedure report does not answer your questions, please call your gastroenterologist to clarify.  If you requested that your care partner not be given the details of your procedure findings, then the procedure report has been included in a sealed envelope for you to review at your convenience later.  YOU SHOULD EXPECT: Some feelings of bloating in the abdomen. Passage of more gas than usual.  Walking can help get rid of the air that was put into your GI tract during the procedure and reduce the bloating. If you had a lower endoscopy (such as a colonoscopy or flexible sigmoidoscopy) you may notice spotting of blood in your stool or on the toilet paper. If you underwent a bowel prep for your procedure, you may not have a normal bowel movement for a few days.  Please Note:  You might notice some irritation and congestion in your nose or some drainage.  This is from the oxygen used during your procedure.  There is no need for concern and it should clear up in a day or so.  SYMPTOMS TO REPORT IMMEDIATELY:   Following upper endoscopy (EGD)  Vomiting of blood or coffee ground material  New chest pain or pain under the shoulder blades  Painful or persistently difficult swallowing  New shortness of breath  Fever of 100F or higher  Black, tarry-looking stools  For urgent or emergent issues, a gastroenterologist can be reached at any hour by calling [phone]. Do not use MyChart messaging for urgent  concerns.   DIET:  We do recommend a small meal at first, but then you may proceed to your regular diet.  Drink plenty of fluids but you should avoid alcoholic beverages for 24 hours.  ACTIVITY:  You should plan to take it easy for the rest of today and you should NOT DRIVE or use heavy machinery until tomorrow (because of the sedation medicines used during the test).    FOLLOW UP: Our staff will call the number listed on your records 48-72 hours following your procedure to check on you and address any questions or concerns that you may have regarding the information given to you following your procedure. If we do not reach you, we will leave a message.  We will attempt to reach you two times.  During this call, we will ask if you have developed any symptoms of COVID 19. If you develop any symptoms (ie: fever, flu-like symptoms, shortness of breath, cough etc.) before then, please call [phone].  If you test positive for Covid 19 in the 2 weeks post procedure, please call and report this information to [place].    If any biopsies were taken you will be contacted by phone or by letter within the next 1-3 weeks.  Please call us at [phone] if you have not heard about the biopsies in 3 weeks.   SIGNATURES/CONFIDENTIALITY: You and/or your care partner have signed paperwork which will be entered into your  electronic medical record.  These signatures attest to the fact that that the information above on your After Visit Summary has been reviewed and is understood.  Full responsibility of the confidentiality of this discharge information lies with you and/or your care-partner.

## 2020-04-02 ENCOUNTER — Telehealth: Payer: Self-pay | Admitting: *Deleted

## 2020-04-02 NOTE — Telephone Encounter (Signed)
  Follow up Call-  Call back number 04/01/2020  Post procedure Call Back phone  # [phone]  Permission to leave phone message Yes  Some recent data might be hidden     Patient questions:  Do you have a fever, pain , or abdominal swelling? No. Pain Score  0 *  Have you tolerated food without any problems? Yes.    Have you been able to return to your normal activities? Yes.    Do you have any questions about your discharge instructions: Diet   No. Medications  No. Follow up visit  No.  Do you have questions or concerns about your Care? No.  Actions: * If pain score is 4 or above: No action needed, pain <4.  1. Have you developed a fever since your procedure? no  2.   Have you had an respiratory symptoms (SOB or cough) since your procedure? no  3.   Have you tested positive for COVID 19 since your procedure no  4.   Have you had any family members/close contacts diagnosed with the COVID 19 since your procedure?  no   If yes to any of these questions please route to Joylene John, RN and Joella Prince, RN

## 2020-04-07 ENCOUNTER — Encounter: Payer: Self-pay | Admitting: Registered Nurse

## 2020-04-07 NOTE — Progress Notes (Signed)
Acute Office Visit  Subjective:    Patient ID: Katie Powell, female    DOB: Mar 06, 1991, 29 y.o.   MRN: [id number]  Chief Complaint  Patient presents with  . hard to swallow    Pt stated that she have been having a hard time trying to swallow. She said that she feels like the protonix is helping but its still hard to swallow. She aslo is experiencing so Rt flank pain that has been going on for the past week along with anxiety21/Depression16    HPI Patient is in today for ongoing dysphagia.  protonix has helped but not nearly enough. Having trouble even with some liquids. No choking but noticed that her appetite is down a lot because of difficulty swallowing.  Also having some flank pain. Onset a week ago. Some urinary symptoms. No vaginal symptoms. No systemic symptoms.   Past Medical History:  Diagnosis Date  . Anemia   . Anxiety   . Blood transfusion without reported diagnosis   . Establishing care with new doctor, encounter for 03/09/2019  . GERD (gastroesophageal reflux disease)     Past Surgical History:  Procedure Laterality Date  . EYE SURGERY    . FINGER SURGERY      Family History  Adopted: Yes    Social History   Socioeconomic History  . Marital status: Single    Spouse name: Not on file  . Number of children: Not on file  . Years of education: Not on file  . Highest education level: Not on file  Occupational History  . Not on file  Tobacco Use  . Smoking status: Current Every Day Smoker    Packs/day: 0.50    Types: Cigarettes  . Smokeless tobacco: Never Used  Vaping Use  . Vaping Use: Never used  Substance and Sexual Activity  . Alcohol use: Not Currently  . Drug use: No  . Sexual activity: Yes    Birth control/protection: Injection, None  Other Topics Concern  . Not on file  Social History Narrative  . Not on file   Social Determinants of Health   Financial Resource Strain:   . Difficulty of Paying Living Expenses: Not on file    Food Insecurity:   . Worried About Charity fundraiser in the Last Year: Not on file  . Ran Out of Food in the Last Year: Not on file  Transportation Needs:   . Lack of Transportation (Medical): Not on file  . Lack of Transportation (Non-Medical): Not on file  Physical Activity:   . Days of Exercise per Week: Not on file  . Minutes of Exercise per Session: Not on file  Stress:   . Feeling of Stress : Not on file  Social Connections:   . Frequency of Communication with Friends and Family: Not on file  . Frequency of Social Gatherings with Friends and Family: Not on file  . Attends Religious Services: Not on file  . Active Member of Clubs or Organizations: Not on file  . Attends Archivist Meetings: Not on file  . Marital Status: Not on file  Intimate Partner Violence:   . Fear of Current or Ex-Partner: Not on file  . Emotionally Abused: Not on file  . Physically Abused: Not on file  . Sexually Abused: Not on file    Outpatient Medications Prior to Visit  Medication Sig Dispense Refill  . aluminum-magnesium hydroxide 200-200 MG/5ML suspension Take 15 mLs by mouth 3 (three) times daily  with meals as needed for indigestion. 355 mL 0  . fluticasone (FLONASE) 50 MCG/ACT nasal spray Place 1 spray into both nostrils daily. 11.1 mL 2  . albuterol (VENTOLIN HFA) 108 (90 Base) MCG/ACT inhaler Inhale 2 puffs into the lungs every 2 (two) hours as needed for wheezing or shortness of breath (cough). 8 g 0  . ALPRAZolam (NIRAVAM) 0.5 MG dissolvable tablet Take 1 tablet (0.5 mg total) by mouth at bedtime as needed for anxiety. 30 tablet 0  . cetirizine (ZYRTEC ALLERGY) 10 MG tablet Take 1 tablet (10 mg total) by mouth daily. 30 tablet 0  . hydrOXYzine (ATARAX/VISTARIL) 50 MG tablet Take 1 tablet (50 mg total) by mouth 3 (three) times daily as needed. 60 tablet 0  . lidocaine (XYLOCAINE) 2 % solution Use as directed 15 mLs in the mouth or throat 3 (three) times daily with meals as needed  for mouth pain. 100 mL 0  . pantoprazole (PROTONIX) 20 MG tablet Take 1 tablet (20 mg total) by mouth 2 (two) times daily. 30 tablet 1  . sucralfate (CARAFATE) 1 GM/10ML suspension Take 10 mLs (1 g total) by mouth 4 (four) times daily -  with meals and at bedtime. 420 mL 0  . traZODone (DESYREL) 50 MG tablet Take 0.5-1 tablets (25-50 mg total) by mouth at bedtime as needed for sleep. 30 tablet 3   No facility-administered medications prior to visit.    No Known Allergies  Review of Systems Per hpi      Objective:    Physical Exam Vitals and nursing note reviewed.  Constitutional:      Appearance: Normal appearance. She is normal weight.  HENT:     Head: Normocephalic and atraumatic.  Neck:     Vascular: No carotid bruit.  Cardiovascular:     Rate and Rhythm: Normal rate and regular rhythm.     Heart sounds: Normal heart sounds.  Pulmonary:     Effort: Pulmonary effort is normal. No respiratory distress.     Breath sounds: Normal breath sounds.  Musculoskeletal:     Cervical back: Normal range of motion and neck supple. No rigidity or tenderness.  Lymphadenopathy:     Cervical: No cervical adenopathy.  Skin:    General: Skin is warm and dry.  Neurological:     General: No focal deficit present.     Mental Status: She is alert and oriented to person, place, and time. Mental status is at baseline.  Psychiatric:        Mood and Affect: Mood normal.        Behavior: Behavior normal.        Thought Content: Thought content normal.        Judgment: Judgment normal.     BP 126/87 (BP Location: Right Arm, Patient Position: Sitting, Cuff Size: Normal)   Pulse 99   Temp 98 F (36.7 C) (Temporal)   Ht 5\' 6"  (1.676 m)   Wt 188 lb 9.6 oz (85.5 kg)   LMP 01/24/2020   SpO2 96%   BMI 30.44 kg/m  Wt Readings from Last 3 Encounters:  04/01/20 186 lb (84.4 kg)  03/25/20 186 lb 6.4 oz (84.6 kg)  02/02/20 188 lb 9.6 oz (85.5 kg)    Health Maintenance Due  Topic Date Due  .  COVID-19 Vaccine (1) Never done    There are no preventive care reminders to display for this patient.   Lab Results  Component Value Date   TSH 0.977 06/16/2019  Lab Results  Component Value Date   WBC 5.1 11/28/2019   HGB 11.2 11/28/2019   HCT 34.1 11/28/2019   MCV 82 11/28/2019   PLT 271 11/28/2019   Lab Results  Component Value Date   NA 137 06/16/2019   K 3.8 06/16/2019   CO2 20 06/16/2019   GLUCOSE 90 06/16/2019   BUN 11 06/16/2019   CREATININE 0.73 06/16/2019   BILITOT 0.4 06/16/2019   ALKPHOS 60 06/16/2019   AST 18 06/16/2019   ALT 12 06/16/2019   PROT 7.2 06/16/2019   ALBUMIN 4.1 06/16/2019   CALCIUM 9.4 06/16/2019   ANIONGAP 7 02/23/2019   No results found for: CHOL No results found for: HDL No results found for: LDLCALC No results found for: TRIG No results found for: CHOLHDL No results found for: HGBA1C     Assessment & Plan:   Problem List Items Addressed This Visit    None    Visit Diagnoses    Flank pain    -  Primary   Relevant Orders   POCT urinalysis dipstick (Completed)   Urine Culture (Completed)   Panic attacks       Globus sensation           Meds ordered this encounter  Medications  . DISCONTD: hydrOXYzine (ATARAX/VISTARIL) 50 MG tablet    Sig: Take 1 tablet (50 mg total) by mouth 3 (three) times daily as needed.    Dispense:  60 tablet    Refill:  0    Order Specific Question:   Supervising Provider    Answer:   Carlota Raspberry, JEFFREY R [[id number]]  . DISCONTD: sucralfate (CARAFATE) 1 GM/10ML suspension    Sig: Take 10 mLs (1 g total) by mouth 4 (four) times daily -  with meals and at bedtime.    Dispense:  840 mL    Refill:  0    Order Specific Question:   Supervising Provider    Answer:   Carlota Raspberry, JEFFREY R [[id number]]   PLAN  Hydroxyzine and sucralfate given  Consider referral to GI  POCT UA not definite for UTI, will send urine culture. Close follow up with any ongoing symptoms. Encourage nonpharm and hydration  Patient  encouraged to call clinic with any questions, comments, or concerns.   [name], NP

## 2020-04-22 ENCOUNTER — Encounter: Payer: Self-pay | Admitting: Gastroenterology

## 2020-04-25 ENCOUNTER — Emergency Department (HOSPITAL_COMMUNITY)
Admission: EM | Admit: 2020-04-25 | Discharge: 2020-04-25 | Disposition: A | Discharge status: Home / Self Care | Payer: Medicaid Other | Attending: Emergency Medicine | Admitting: Emergency Medicine

## 2020-04-25 ENCOUNTER — Other Ambulatory Visit: Payer: Self-pay

## 2020-04-25 ENCOUNTER — Encounter (HOSPITAL_COMMUNITY): Payer: Self-pay

## 2020-04-25 DIAGNOSIS — F17210 Nicotine dependence, cigarettes, uncomplicated: Secondary | ICD-10-CM | POA: Diagnosis not present

## 2020-04-25 DIAGNOSIS — Z20822 Contact with and (suspected) exposure to covid-19: Secondary | ICD-10-CM | POA: Insufficient documentation

## 2020-04-25 DIAGNOSIS — J101 Influenza due to other identified influenza virus with other respiratory manifestations: Secondary | ICD-10-CM | POA: Diagnosis not present

## 2020-04-25 DIAGNOSIS — R000 Tachycardia, unspecified: Secondary | ICD-10-CM | POA: Insufficient documentation

## 2020-04-25 DIAGNOSIS — R509 Fever, unspecified: Secondary | ICD-10-CM | POA: Diagnosis present

## 2020-04-25 LAB — RESP PANEL BY RT-PCR (FLU A&B, COVID) ARPGX2
Influenza A by PCR: POSITIVE — AB
Influenza B by PCR: NEGATIVE
SARS Coronavirus 2 by RT PCR: NEGATIVE

## 2020-04-25 MED ORDER — OSELTAMIVIR PHOSPHATE 75 MG PO CAPS: 75.0000 mg | ORAL_CAPSULE | Freq: Two times a day (BID) | ORAL | 0 refills | Status: DC

## 2020-04-25 MED ORDER — ACETAMINOPHEN 500 MG PO TABS
1000.0000 mg | ORAL_TABLET | Freq: Once | ORAL | Status: AC
  Administered 2020-04-25: 1000 mg via ORAL
  Filled 2020-04-25: qty 2

## 2020-04-25 MED ORDER — ACETAMINOPHEN 500 MG PO TABS: 500.0000 mg | ORAL_TABLET | Freq: Four times a day (QID) | ORAL | 0 refills | Status: AC | PRN

## 2020-04-25 NOTE — ED Provider Notes (Signed)
[facility] DEPT Provider Note   CSN: [id number] Arrival date & time: 04/25/20  1922     History Chief Complaint  Patient presents with  . Generalized Body Aches    EBONY YORIO is a 29 y.o. female.  The history is provided by the patient and medical records. No language interpreter was used.     29 year old female significant history of anemia, anxiety, presenting complaining of Covid symptoms.  Patient reports she woke up this morning feeling fever, chills, headache, generalized weakness, and overall not feeling well.  She was going through her shift but she was worried that she may have had Covid since her boyfriend since recently tested positive for Covid a few days prior.  She has been vaccinated for Covid but have not had her booster yet.  She is a smoker.  She denies any significant shortness of breath, cough, vomiting or diarrhea.  She has not had a flu shot.  She did take some TheraFlu 4 hours ago.  Her last menstrual period was a week ago.  Past Medical History:  Diagnosis Date  . Anemia   . Anxiety   . Blood transfusion without reported diagnosis   . Establishing care with new doctor, encounter for 03/09/2019  . GERD (gastroesophageal reflux disease)     Patient Active Problem List   Diagnosis Date Noted  . Other fatigue 06/16/2019  . Normal vaginal delivery 03/27/2019  . Obstetrical laceration 03/27/2019  . Variable fetal heart rate decelerations, antepartum 03/25/2019  . Encounter for induction of labor 03/25/2019  . Establishing care with new doctor, encounter for 03/09/2019  . 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 Supervision of other normal pregnancy, antepartum 09/06/2018  . Iron deficiency anemia due to chronic blood loss 08/20/2016  . Thrombocytosis 08/20/2016    Past Surgical History:  Procedure  Laterality Date  . EYE SURGERY    . FINGER SURGERY       OB History    Gravida  2   Para  1   Term  1   Preterm      AB  1   Living  1     SAB  1   IAB  0   Ectopic      Multiple  0   Live Births  1           Family History  Adopted: Yes    Social History   Tobacco Use  . Smoking status: Current Every Day Smoker    Packs/day: 0.50    Types: Cigarettes  . Smokeless tobacco: Never Used  Vaping Use  . Vaping Use: Never used  Substance Use Topics  . Alcohol use: Not Currently  . Drug use: No    Home Medications Prior to Admission medications   Medication Sig Start Date End Date Taking? Authorizing Provider  aluminum-magnesium hydroxide 200-200 MG/5ML suspension Take 15 mLs by mouth 3 (three) times daily with meals as needed for indigestion. 01/29/20   Loura Halt A, NP  ferrous sulfate 325 (65 FE) MG tablet Take 325 mg by mouth 2 (two) times daily with a meal.    [provider]  fluticasone (FLONASE) 50 MCG/ACT nasal spray Place 1 spray into both nostrils daily. 09/29/19   Darr, Edison Nasuti, PA-C  hydrOXYzine (ATARAX/VISTARIL) 50 MG tablet Take 1 tablet (50 mg total) by mouth 3 (three) times daily as  needed. 02/27/20   [name], NP  lidocaine (XYLOCAINE) 2 % solution Use as directed 15 mLs in the mouth or throat every 4 (four) hours as needed for mouth pain. May use for one week 04/01/20   Mauri Pole, MD  pantoprazole (PROTONIX) 40 MG tablet Take 1 tablet (40 mg total) by mouth 2 (two) times daily. 03/25/20   Mauri Pole, MD  pantoprazole (PROTONIX) 40 MG tablet Take 1 tablet (40 mg total) by mouth 2 (two) times daily. 04/01/20   Mauri Pole, MD  sucralfate (CARAFATE) 1 g tablet Take 1 tablet (1 g total) by mouth 4 (four) times daily as needed. 04/01/20   Mauri Pole, MD  sucralfate (CARAFATE) 1 GM/10ML suspension Take 10 mLs (1 g total) by mouth 4 (four) times daily -  with meals and at bedtime. 03/25/20   Mauri Pole, MD  esomeprazole (NEXIUM) 40 MG capsule Take 1 capsule (40 mg total) by mouth daily. 01/09/20 01/29/20  [name], NP    Allergies    Patient has no known allergies.  Review of Systems   Review of Systems  All other systems reviewed and are negative.   Physical Exam Updated Vital Signs BP (!) 134/92 (BP Location: Left Arm)   Pulse (!) 123   Temp 100.2 F (37.9 C) (Oral)   Resp 16   LMP 04/18/2020 (Approximate)   SpO2 100%   Physical Exam Vitals and nursing note reviewed.  Constitutional:      General: She is not in acute distress.    Appearance: She is well-developed and well-nourished.  HENT:     Head: Atraumatic.  Eyes:     Conjunctiva/sclera: Conjunctivae normal.  Cardiovascular:     Rate and Rhythm: Tachycardia present.     Pulses: Normal pulses.     Heart sounds: Normal heart sounds.  Pulmonary:     Effort: Pulmonary effort is normal.     Breath sounds: Normal breath sounds. No wheezing, rhonchi or rales.  Abdominal:     Palpations: Abdomen is soft.     Tenderness: There is no abdominal tenderness.  Musculoskeletal:     Cervical back: Neck supple.  Skin:    Findings: No rash.  Neurological:     Mental Status: She is alert and oriented to person, place, and time.  Psychiatric:        Mood and Affect: Mood and affect and mood normal.     ED Results / Procedures / Treatments   Labs (all labs ordered are listed, but only abnormal results are displayed) Labs Reviewed  RESP PANEL BY RT-PCR (FLU A&B, COVID) ARPGX2 - Abnormal; Notable for the following components:      Result Value   Influenza A by PCR POSITIVE (*)    All other components within normal limits    EKG None  Radiology No results found.  Procedures Procedures (including critical care time)  Medications Ordered in ED Medications  acetaminophen (TYLENOL) tablet 1,000 mg (1,000 mg Oral Given 04/25/20 2230)    ED Course  I have reviewed the triage vital signs and the  nursing notes.  Pertinent labs & imaging results that were available during my care of the patient were reviewed by me and considered in my medical decision making (see chart for details).    MDM Rules/Calculators/A&P                          BP Marland Kitchen)  134/92 (BP Location: Left Arm)   Pulse (!) 123   Temp 100.2 F (37.9 C) (Oral)   Resp 16   LMP 04/18/2020 (Approximate)   SpO2 100%   Final Clinical Impression(s) / ED Diagnoses Final diagnoses:  Influenza A    Rx / DC Orders ED Discharge Orders         Ordered    oseltamivir (TAMIFLU) 75 MG capsule  Every 12 hours        04/25/20 2308    acetaminophen (TYLENOL) 500 MG tablet  Every 6 hours PRN        04/25/20 2308         Patient has been vaccinated for COVID-19 but presents with cold symptoms since earlier today.  Patient was found to have temperature of 100.2, tachycardic with a heart rate of 123 but normal blood pressure.  She is not hypoxic, she is overall well-appearing.  Tylenol given, respiratory panel including Covid and flu have been obtained.  Encourage patient to follow-up on the results through [place].  Work note provided.  At this time patient does not meet requirement to be admitted.  If test positive for COVID-19 she does not meet criteria for monoclonal antibody.  11:07 PM Respiratory panel obtained shown negative Covid however patient test positive for influenza A.  She is in the timeframe that Tamiflu can be helpful.  Will prescribe.  MERIS REEDE was evaluated in Emergency Department on 04/25/2020 for the symptoms described in the history of present illness. She was evaluated in the context of the global COVID-19 pandemic, which necessitated consideration that the patient might be at risk for infection with the SARS-CoV-2 virus that causes COVID-19. Institutional protocols and algorithms that pertain to the evaluation of patients at risk for COVID-19 are in a state of rapid change based on information released  by regulatory bodies including the CDC and federal and state organizations. These policies and algorithms were followed during the patient's care in the ED.    Domenic Moras, PA-C 04/25/20 2312    Daleen Bo, MD 04/26/20 1003

## 2020-04-25 NOTE — Discharge Instructions (Signed)
You have been tested positive for influenza A.  Take Tamiflu as prescribed as treatment.  Return if you have any concern.

## 2020-04-25 NOTE — ED Triage Notes (Signed)
Pt presents with c/o generalized body aches and weakness. Pt reports her boyfriend's son recently tested positive for covid.

## 2020-05-07 ENCOUNTER — Other Ambulatory Visit: Payer: Self-pay | Admitting: Registered Nurse

## 2020-05-07 DIAGNOSIS — F410 Panic disorder [episodic paroxysmal anxiety] without agoraphobia: Secondary | ICD-10-CM

## 2020-05-08 MED ORDER — HYDROXYZINE HCL 50 MG PO TABS: 50.0000 mg | ORAL_TABLET | Freq: Three times a day (TID) | ORAL | 0 refills | Status: DC | PRN

## 2020-06-29 IMAGING — US US MFM OB FOLLOW-UP
1 series · 12 of 28 positions shown · non-contrast
Comparison: none

[Series 1: us mfm ob follow-up · 12 of 44 slices shown]
[im 2/44]
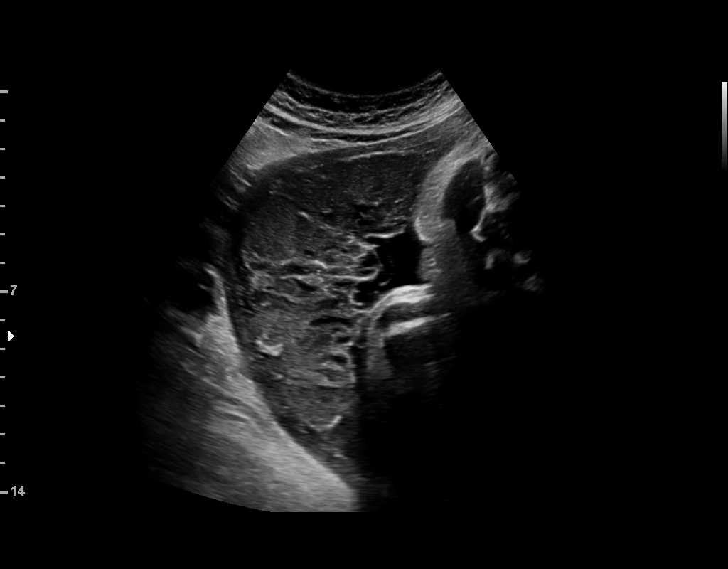
[im 5/44]
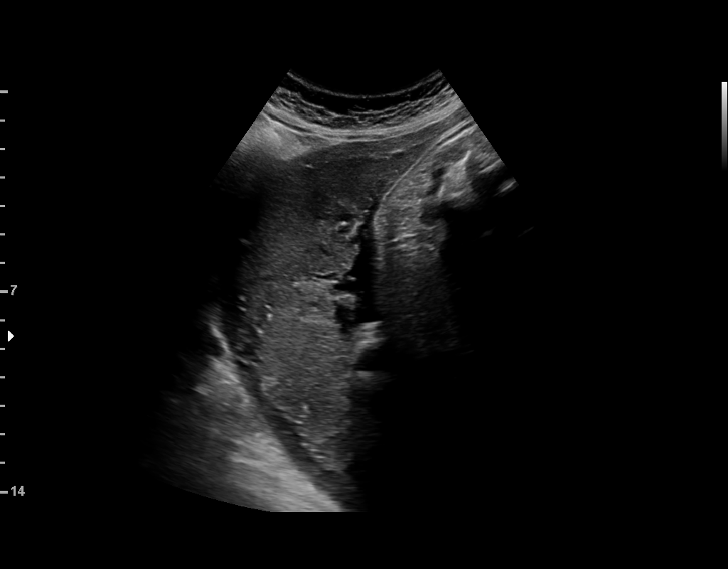
[im 8/44]
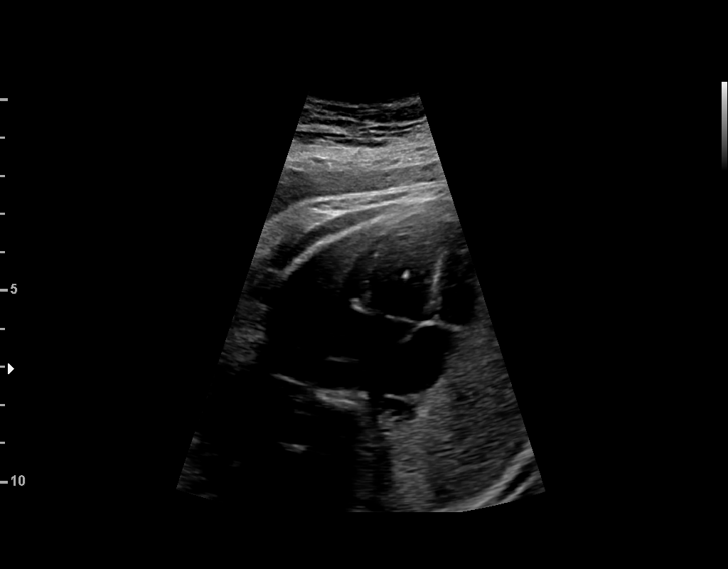
[im 13/44]
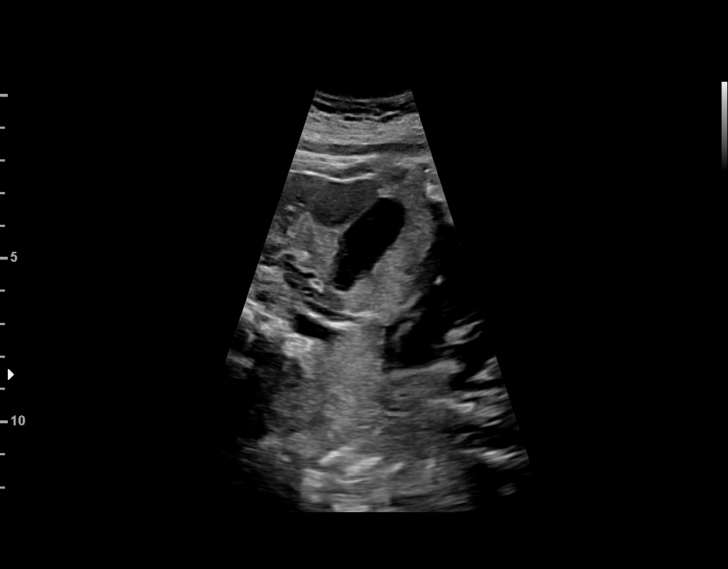
[im 16/44]
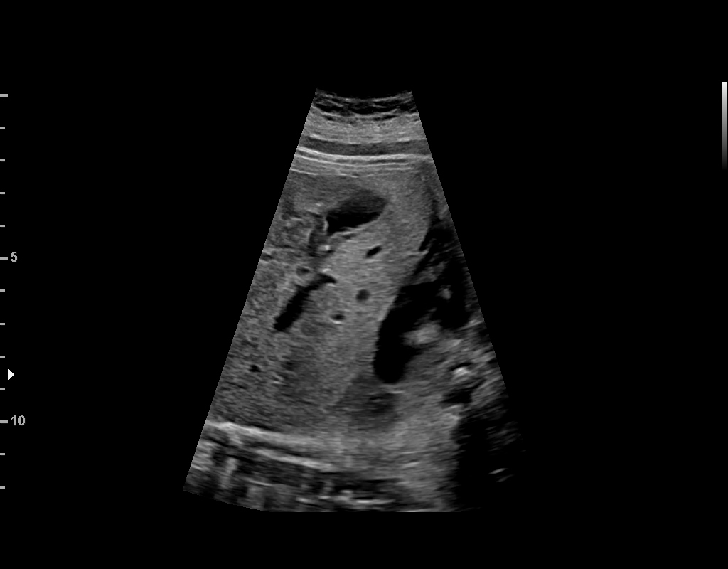
[im 20/44]
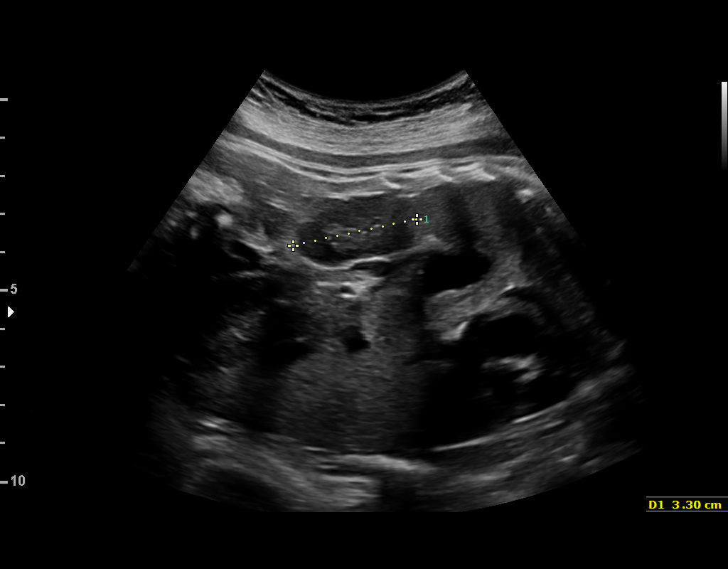
[im 24/44]
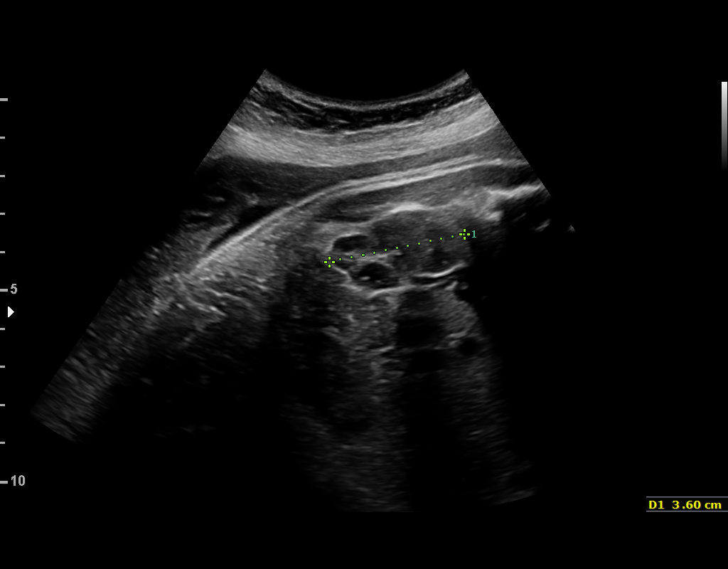
[im 28/44]
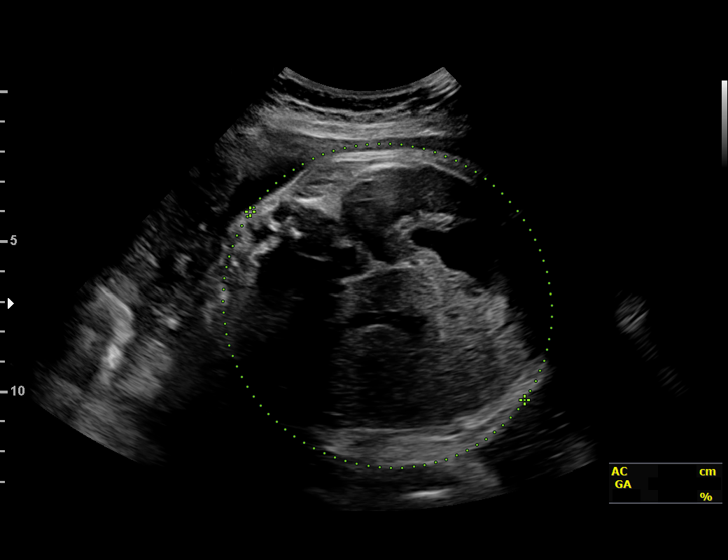
[im 31/44]
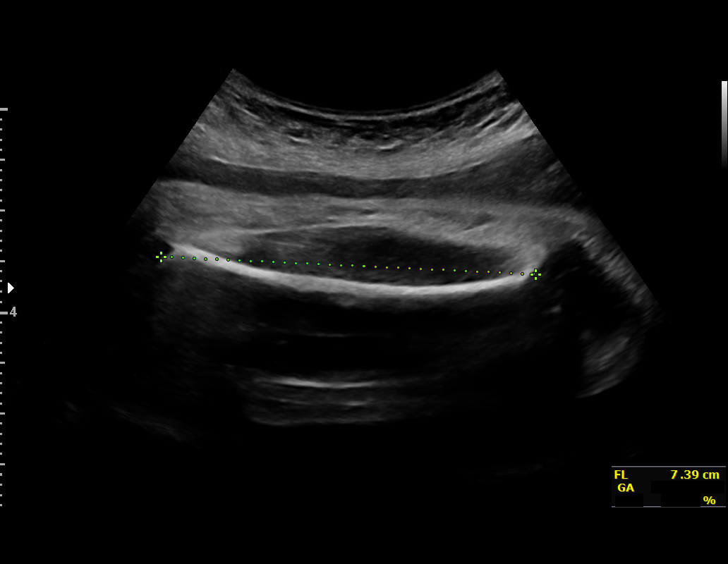
[im 36/44]
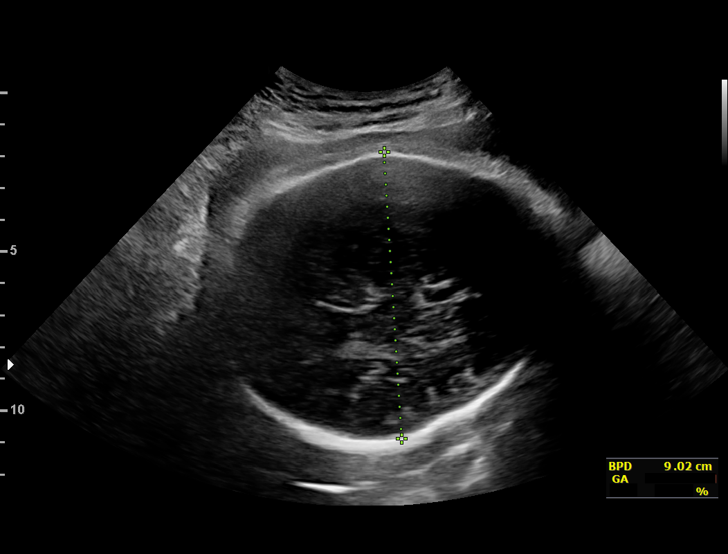
[im 39/44]
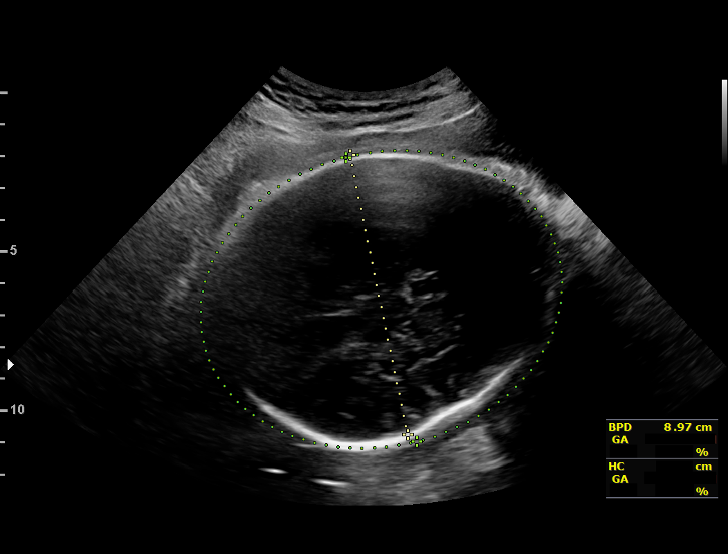
[im 42/44]
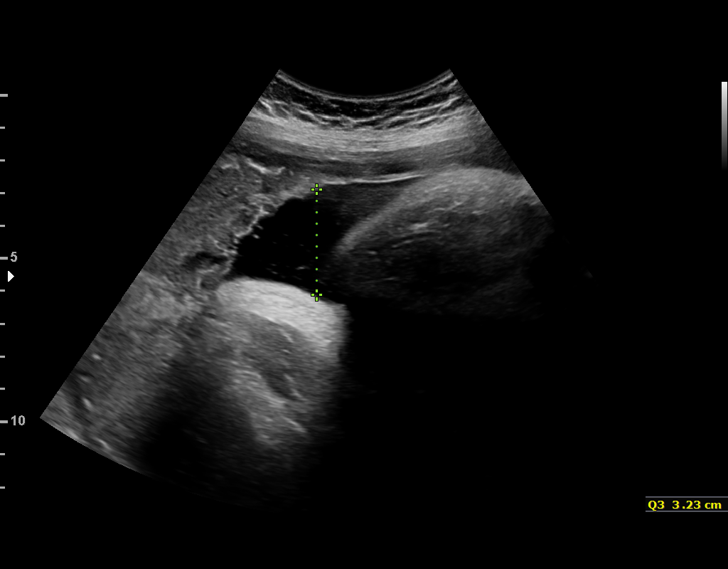

[12 of 28 positions shown; findings below may reference images not displayed]

----------------------------------------------------------------------

 ----------------------------------------------------------------------
Indications

  Echogenic intracardiac focus of the heart
  (EIF)(Negative AFP)(NIPS Low Risk)
  38 weeks gestation of pregnancy
  Tobacco use complicating pregnancy,
  second trimester
  Amemia
 ----------------------------------------------------------------------
Fetal Evaluation

 Num Of Fetuses:         1
 Fetal Heart Rate(bpm):  125
 Cardiac Activity:       Observed
 Presentation:           Cephalic
 Placenta:               Posterior Fundal
 P. Cord Insertion:      Previously Visualized

 Amniotic Fluid
 AFI FV:      Within normal limits

 AFI Sum(cm)     %Tile       Largest Pocket(cm)
 11.15           34

 RUQ(cm)       RLQ(cm)       LUQ(cm)        LLQ(cm)
 1
Biometry

 BPD:      89.7  mm     G. Age:  36w 2d         27  %    CI:        75.12   %    70 - 86
                                                         FL/HC:      22.3   %    20.9 -
 HC:      328.3  mm     G. Age:  37w 2d         16  %    HC/AC:      0.95        0.92 -
 AC:       344   mm     G. Age:  38w 2d         76  %    FL/BPD:     81.6   %    71 - 87
 FL:       73.2  mm     G. Age:  37w 3d         39  %    FL/AC:      21.3   %    20 - 24
 Est. FW:    1127  gm      7 lb 4 oz     56  %
OB History

 Gravidity:    2          SAB:   1
Gestational Age

 LMP:           38w 0d        Date:  06/24/18                 EDD:   03/31/19
 U/S Today:     37w 2d                                        EDD:   04/05/19
 Best:          38w 0d     Det. By:  LMP  (06/24/18)          EDD:   03/31/19
Anatomy

 Cranium:               Appears normal         Aortic Arch:            Previously seen
 Cavum:                 Appears normal         Ductal Arch:            Not well visualized
 Ventricles:            Appears normal         Diaphragm:              Appears normal
 Choroid Plexus:        Appears normal         Stomach:                Appears normal, left
                                                                       sided
 Cerebellum:            Previously seen        Abdomen:                Appears normal
 Posterior Fossa:       Previously seen        Abdominal Wall:         Previously seen
 Nuchal Fold:           Previously seen        Cord Vessels:           Previously seen
 Face:                  Orbits and profile     Kidneys:                Appear normal
                        previously seen
 Lips:                  Previously seen        Bladder:                Appears normal
 Thoracic:              Appears normal         Spine:                  Previously seen
 Heart:                 Echogenic focus        Upper Extremities:      Previously seen
                        in LV
 RVOT:                  Previously seen        Lower Extremities:      Previously seen
 LVOT:                  Previously seen

 Other:  Female gender.  Heels and 5th digit previously visualized. Abdomen
         technically difficult to measure due to fetal position.
Cervix Uterus Adnexa

 Cervix
 Not visualized (advanced GA >54wks)
Comments

 This patient was seen for a follow up growth scan.  She
 denies any problems in her current pregnancy and reports
 that she has screened negative for gestational diabetes.
 She was informed that the fetal growth and amniotic fluid
 level appears appropriate for her gestational age.
 Follow-up as indicated.

## 2020-08-02 ENCOUNTER — Other Ambulatory Visit: Payer: Self-pay | Admitting: Registered Nurse

## 2020-08-02 DIAGNOSIS — F410 Panic disorder [episodic paroxysmal anxiety] without agoraphobia: Secondary | ICD-10-CM

## 2020-08-02 MED ORDER — HYDROXYZINE HCL 50 MG PO TABS: 50.0000 mg | ORAL_TABLET | Freq: Three times a day (TID) | ORAL | 0 refills | Status: DC | PRN

## 2020-10-03 ENCOUNTER — Other Ambulatory Visit: Payer: Self-pay | Admitting: Registered Nurse

## 2020-10-03 DIAGNOSIS — F410 Panic disorder [episodic paroxysmal anxiety] without agoraphobia: Secondary | ICD-10-CM

## 2020-10-04 MED ORDER — HYDROXYZINE HCL 50 MG PO TABS: 50.0000 mg | ORAL_TABLET | Freq: Three times a day (TID) | ORAL | 0 refills | Status: DC | PRN

## 2020-10-08 ENCOUNTER — Encounter (HOSPITAL_BASED_OUTPATIENT_CLINIC_OR_DEPARTMENT_OTHER): Payer: Self-pay | Admitting: Emergency Medicine

## 2020-10-08 ENCOUNTER — Emergency Department (HOSPITAL_BASED_OUTPATIENT_CLINIC_OR_DEPARTMENT_OTHER)
Admission: EM | Admit: 2020-10-08 | Discharge: 2020-10-08 | Disposition: A | Discharge status: Home / Self Care | Payer: Medicaid Other | Attending: Emergency Medicine | Admitting: Emergency Medicine

## 2020-10-08 ENCOUNTER — Other Ambulatory Visit: Payer: Self-pay

## 2020-10-08 ENCOUNTER — Other Ambulatory Visit: Payer: Self-pay | Admitting: Registered Nurse

## 2020-10-08 ENCOUNTER — Encounter: Payer: Self-pay | Admitting: Registered Nurse

## 2020-10-08 DIAGNOSIS — R55 Syncope and collapse: Secondary | ICD-10-CM | POA: Insufficient documentation

## 2020-10-08 DIAGNOSIS — F17210 Nicotine dependence, cigarettes, uncomplicated: Secondary | ICD-10-CM | POA: Insufficient documentation

## 2020-10-08 DIAGNOSIS — D649 Anemia, unspecified: Secondary | ICD-10-CM | POA: Diagnosis not present

## 2020-10-08 LAB — CBC
HCT: 27.5 % — ABNORMAL LOW (ref 36.0–46.0)
Hemoglobin: 7.8 g/dL — ABNORMAL LOW (ref 12.0–15.0)
MCH: 18.1 pg — ABNORMAL LOW (ref 26.0–34.0)
MCHC: 28.4 g/dL — ABNORMAL LOW (ref 30.0–36.0)
MCV: 63.8 fL — ABNORMAL LOW (ref 80.0–100.0)
Platelets: 270 10*3/uL (ref 150–400)
RBC: 4.31 MIL/uL (ref 3.87–5.11)
RDW: 19.9 % — ABNORMAL HIGH (ref 11.5–15.5)
WBC: 6.5 10*3/uL (ref 4.0–10.5)
nRBC: 0 % (ref 0.0–0.2)

## 2020-10-08 LAB — BASIC METABOLIC PANEL
Anion gap: 9 (ref 5–15)
BUN: 13 mg/dL (ref 6–20)
CO2: 22 mmol/L (ref 22–32)
Calcium: 8.9 mg/dL (ref 8.9–10.3)
Chloride: 106 mmol/L (ref 98–111)
Creatinine, Ser: 0.74 mg/dL (ref 0.44–1.00)
GFR, Estimated: >60 mL/min (ref >60)
Glucose, Bld: 82 mg/dL (ref 70–99)
Potassium: 3.7 mmol/L (ref 3.5–5.1)
Sodium: 137 mmol/L (ref 135–145)

## 2020-10-08 MED ORDER — SODIUM CHLORIDE 0.9 % IV BOLUS
1000.0000 mL | Freq: Once | INTRAVENOUS | Status: AC
  Administered 2020-10-08: 1000 mL via INTRAVENOUS

## 2020-10-08 MED ORDER — FERROUS SULFATE 325 (65 FE) MG PO TABS: 325.0000 mg | ORAL_TABLET | Freq: Two times a day (BID) | ORAL | 0 refills | Status: DC

## 2020-10-08 NOTE — ED Notes (Signed)
Patient verbalizes understanding of discharge instructions. Opportunity for questioning and answers were provided. Armband removed by staff, pt discharged from ED.  

## 2020-10-08 NOTE — ED Triage Notes (Signed)
Pt works at [employer] and has been constipated , went to go the BR to have BM but could not and she became syncopal , called ems, did not pass out did not hit her head, pt  Now aaox4  Feels nauseated at this time has 20 left  [facility]

## 2020-10-08 NOTE — Discharge Instructions (Signed)
Your hemoglobin today is 7.8.  You need to be seen for recheck and iron infusion by your primary care doctor. Please do not exert yourself.  If you become weak or lightheaded again return to the emergency department.

## 2020-10-08 NOTE — ED Provider Notes (Signed)
[facility] EMERGENC[facility] DEPT Provider Note   CSN: [id number] Arrival date & time: 10/08/20  1258     History Chief Complaint  Patient presents with  . Near Syncope    Katie Powell is a 30 y.o. female.  HPI  30 year old female history of anemia and vasovagal syncope presents today after syncopal episode.  She reports she was at work and she felt like her usual self till she began having some GI upset.  She went to the bathroom when she returned to work she felt like she was going to pass out and in fact had a syncopal episode.  She reports that she woke up very soon afterwards.  She initially had some back pain from the fall which has resolved.  She states that she has these episodes frequently and would not have come to the hospital had she been at home.  She has a history of anemia and states that she required some iron transfusions during her pregnancy but has not had that checked recently.  She denies any bleeding, pregnancy, or ongoing lightheadedness.     Past Medical History:  Diagnosis Date  . Anemia   . Anxiety   . Blood transfusion without reported diagnosis   . Establishing care with new doctor, encounter for 03/09/2019  . GERD (gastroesophageal reflux disease)     Patient Active Problem List   Diagnosis Date Noted  . Other fatigue 06/16/2019  . Normal vaginal delivery 03/27/2019  . Obstetrical laceration 03/27/2019  . Variable fetal heart rate decelerations, antepartum 03/25/2019  . Encounter for induction of labor 03/25/2019  . Establishing care with new doctor, encounter for 03/09/2019  . 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 Supervision of other normal pregnancy, antepartum 09/06/2018  . Iron deficiency anemia due to chronic blood loss 08/20/2016  . Thrombocytosis 08/20/2016    Past Surgical History:  Procedure Laterality  Date  . EYE SURGERY    . FINGER SURGERY       OB History    Gravida  2   Para  1   Term  1   Preterm      AB  1   Living  1     SAB  1   IAB  0   Ectopic      Multiple  0   Live Births  1           Family History  Adopted: Yes    Social History   Tobacco Use  . Smoking status: Current Every Day Smoker    Packs/day: 0.50    Types: Cigarettes  . Smokeless tobacco: Never Used  Vaping Use  . Vaping Use: Never used  Substance Use Topics  . Alcohol use: Not Currently  . Drug use: No    Home Medications Prior to Admission medications   Medication Sig Start Date End Date Taking? Authorizing Provider  ferrous sulfate 325 (65 FE) MG tablet Take 325 mg by mouth 2 (two) times daily with a meal.   Yes [provider]  hydrOXYzine (ATARAX/VISTARIL) 50 MG tablet Take 1 tablet (50 mg total) by mouth 3 (three) times daily as needed. Patient taking differently: Take 50 mg by mouth as needed (patient only takes as needed). 10/04/20  Yes [name], NP  acetaminophen (TYLENOL) 500 MG tablet Take 1 tablet (500 mg total) by mouth every 6 (six) hours as needed.  Patient not taking: Reported on 10/08/2020 04/25/20   Domenic Moras, PA-C  aluminum-magnesium hydroxide 200-200 MG/5ML suspension Take 15 mLs by mouth 3 (three) times daily with meals as needed for indigestion. Patient not taking: Reported on 10/08/2020 01/29/20   Loura Halt A, NP  fluticasone (FLONASE) 50 MCG/ACT nasal spray Place 1 spray into both nostrils daily. Patient not taking: Reported on 10/08/2020 09/29/19   Darr, Edison Nasuti, PA-C  lidocaine (XYLOCAINE) 2 % solution Use as directed 15 mLs in the mouth or throat every 4 (four) hours as needed for mouth pain. May use for one week 04/01/20   Mauri Pole, MD  oseltamivir (TAMIFLU) 75 MG capsule Take 1 capsule (75 mg total) by mouth every 12 (twelve) hours. Patient not taking: Reported on 10/08/2020 04/25/20   Domenic Moras, PA-C  pantoprazole (PROTONIX)  40 MG tablet Take 1 tablet (40 mg total) by mouth 2 (two) times daily. Patient not taking: Reported on 10/08/2020 03/25/20   Mauri Pole, MD  pantoprazole (PROTONIX) 40 MG tablet Take 1 tablet (40 mg total) by mouth 2 (two) times daily. Patient not taking: Reported on 10/08/2020 04/01/20   Mauri Pole, MD  sucralfate (CARAFATE) 1 g tablet Take 1 tablet (1 g total) by mouth 4 (four) times daily as needed. Patient not taking: Reported on 10/08/2020 04/01/20   Mauri Pole, MD  sucralfate (CARAFATE) 1 GM/10ML suspension Take 10 mLs (1 g total) by mouth 4 (four) times daily -  with meals and at bedtime. Patient not taking: Reported on 10/08/2020 03/25/20   Mauri Pole, MD  esomeprazole (NEXIUM) 40 MG capsule Take 1 capsule (40 mg total) by mouth daily. 01/09/20 01/29/20  [name], NP    Allergies    Patient has no known allergies.  Review of Systems   Review of Systems  All other systems reviewed and are negative.   Physical Exam Updated Vital Signs BP 109/82 (BP Location: Right Arm)   Pulse 74   Temp 100.2 F (37.9 C) (Oral)   Resp 20   Ht 1.676 m (5\' 6" )   Wt 87.5 kg   SpO2 100%   BMI 31.15 kg/m   Physical Exam Vitals and nursing note reviewed.  Constitutional:      General: She is not in acute distress.    Appearance: Normal appearance.  HENT:     Head: Normocephalic.     Right Ear: External ear normal.     Left Ear: External ear normal.     Nose: Nose normal.     Mouth/Throat:     Pharynx: Oropharynx is clear.  Cardiovascular:     Rate and Rhythm: Normal rate and regular rhythm.     Pulses: Normal pulses.     Heart sounds: Normal heart sounds.  Pulmonary:     Effort: Pulmonary effort is normal.  Abdominal:     Palpations: Abdomen is soft.  Musculoskeletal:        General: Normal range of motion.     Cervical back: Normal range of motion.  Skin:    General: Skin is warm.     Capillary Refill: Capillary refill takes less than 2  seconds.  Neurological:     Mental Status: She is alert.  Psychiatric:        Mood and Affect: Mood normal.     ED Results / Procedures / Treatments   Labs (all labs ordered are listed, but only abnormal results are displayed) Labs Reviewed  CBC -  Abnormal; Notable for the following components:      Result Value   Hemoglobin 7.8 (*)    HCT 27.5 (*)    MCV 63.8 (*)    MCH 18.1 (*)    MCHC 28.4 (*)    RDW 19.9 (*)    All other components within normal limits  BASIC METABOLIC PANEL    EKG EKG Interpretation  Date/Time:  Tuesday Oct 08 2020 13:22:47 EDT Ventricular Rate:  64 PR Interval:  163 QRS Duration: 81 QT Interval:  403 QTC Calculation: 416 R Axis:   27 Text Interpretation: Sinus rhythm Probable left atrial enlargement Confirmed by Pattricia Boss [phone]) on 10/08/2020 2:42:32 PM   Radiology No results found.  Procedures Procedures   Medications Ordered in ED Medications  sodium chloride 0.9 % bolus 1,000 mL (1,000 mLs Intravenous New Bag/Given 10/08/20 1358)    ED Course  I have reviewed the triage vital signs and the nursing notes.  Pertinent labs & imaging results that were available during my care of the patient were reviewed by me and considered in my medical decision making (see chart for details).    MDM Rules/Calculators/A&P                          Patient presents today with vasovagal syncope.  However she also is anemic here on exam with a hemoglobin of 7.8.  Patient is hemodynamically stable here with normal heart rate and normal blood pressure.  She is advised regarding need for close follow-up and voices understanding. Final Clinical Impression(s) / ED Diagnoses Final diagnoses:  Vasovagal syncope  Anemia, unspecified type    Rx / DC Orders ED Discharge Orders    None       Pattricia Boss, MD 10/08/20 1447

## 2020-10-09 ENCOUNTER — Encounter: Payer: Self-pay | Admitting: Registered Nurse

## 2020-10-09 ENCOUNTER — Ambulatory Visit: Payer: Medicaid Other | Admitting: Registered Nurse

## 2020-10-09 ENCOUNTER — Other Ambulatory Visit: Payer: Self-pay

## 2020-10-09 VITALS — HR 70 | Temp 98.3°F | Resp 18 | Ht 66.0 in | Wt 195.6 lb

## 2020-10-09 DIAGNOSIS — D649 Anemia, unspecified: Secondary | ICD-10-CM

## 2020-10-09 LAB — CBC WITH DIFFERENTIAL/PLATELET
Basophils Absolute: 0 10*3/uL (ref 0.0–0.1)
Basophils Relative: 0.8 % (ref 0.0–3.0)
Eosinophils Absolute: 0.2 10*3/uL (ref 0.0–0.7)
Eosinophils Relative: 2.9 % (ref 0.0–5.0)
HCT: 25.8 % — ABNORMAL LOW (ref 36.0–46.0)
Hemoglobin: 7.8 g/dL — CL (ref 12.0–15.0)
Lymphocytes Relative: 37.9 % (ref 12.0–46.0)
Lymphs Abs: 2 10*3/uL (ref 0.7–4.0)
MCHC: 30.4 g/dL (ref 30.0–36.0)
MCV: 59.5 fl — ABNORMAL LOW (ref 78.0–100.0)
Monocytes Absolute: 0.3 10*3/uL (ref 0.1–1.0)
Monocytes Relative: 6.7 % (ref 3.0–12.0)
Neutro Abs: 2.7 10*3/uL (ref 1.4–7.7)
Neutrophils Relative %: 51.7 % (ref 43.0–77.0)
Platelets: 321 10*3/uL (ref 150.0–400.0)
RBC: 4.33 Mil/uL (ref 3.87–5.11)
RDW: 20.2 % — ABNORMAL HIGH (ref 11.5–15.5)
WBC: 5.3 10*3/uL (ref 4.0–10.5)

## 2020-10-09 LAB — B12 AND FOLATE PANEL
Folate: 8.8 ng/mL (ref >5.9)
Vitamin B-12: 255 pg/mL (ref 211–911)

## 2020-10-09 NOTE — Patient Instructions (Signed)
Ms Hearty -  As always, good to see you  Labs should be back today or tomorrow. I'll let you know if there are concerns.  Let's plan on continuing iron supplements and see what hematology has to say regarding infusions. They should be calling you soon.  In the mean time, pursue a healthy diet and adequate hydration. Lots of leafy greens can help with iron.  Thank you  Rich

## 2020-10-09 NOTE — Progress Notes (Signed)
Established Patient Office Visit  Subjective:  Patient ID: Katie Powell, female    DOB: November 25, 1990  Age: 30 y.o. MRN: [id number]  CC:  Chief Complaint  Patient presents with  . Follow-up    Patient is here to discuss lab work and also infusions. Patient states she has been feeling sluggish and passed out at work    HPI [name] presents for follow up  Syncope at work. No injury Was taken to ED, found to have h/h 7.8/27.5 MCV 63.8 MCH 18.1, MCHC 28.4  Longstanding well established hx of iron deficiency anemia  Had been taking PO iron intermittently Diet has been steady  No further syncopal episodes Does endorse: fatigue, cold sensation, shob, headaches, palpitations, and anxiety  EKG from ER shows potential LA enlargement. On review today shows no acute concerns. Pt declines to repeat   Past Medical History:  Diagnosis Date  . Anemia   . Anxiety   . Blood transfusion without reported diagnosis   . Establishing care with new doctor, encounter for 03/09/2019  . GERD (gastroesophageal reflux disease)     Past Surgical History:  Procedure Laterality Date  . EYE SURGERY    . FINGER SURGERY      Family History  Adopted: Yes    Social History   Socioeconomic History  . Marital status: Single    Spouse name: Not on file  . Number of children: Not on file  . Years of education: Not on file  . Highest education level: Not on file  Occupational History  . Not on file  Tobacco Use  . Smoking status: Current Every Day Smoker    Packs/day: 0.50    Types: Cigarettes  . Smokeless tobacco: Never Used  Vaping Use  . Vaping Use: Never used  Substance and Sexual Activity  . Alcohol use: Not Currently  . Drug use: No  . Sexual activity: Yes    Birth control/protection: Injection, None  Other Topics Concern  . Not on file  Social History Narrative  . Not on file   Social Determinants of Health   Financial Resource Strain: Not on file  Food  Insecurity: Not on file  Transportation Needs: Not on file  Physical Activity: Not on file  Stress: Not on file  Social Connections: Not on file  Intimate Partner Violence: Not on file    Outpatient Medications Prior to Visit  Medication Sig Dispense Refill  . ferrous sulfate 325 (65 FE) MG tablet Take 1 tablet (325 mg total) by mouth 2 (two) times daily with a meal. 180 tablet 0  . acetaminophen (TYLENOL) 500 MG tablet Take 1 tablet (500 mg total) by mouth every 6 (six) hours as needed. (Patient not taking: No sig reported) 30 tablet 0  . aluminum-magnesium hydroxide 200-200 MG/5ML suspension Take 15 mLs by mouth 3 (three) times daily with meals as needed for indigestion. (Patient not taking: No sig reported) 355 mL 0  . fluticasone (FLONASE) 50 MCG/ACT nasal spray Place 1 spray into both nostrils daily. (Patient not taking: No sig reported) 11.1 mL 2  . hydrOXYzine (ATARAX/VISTARIL) 50 MG tablet Take 1 tablet (50 mg total) by mouth 3 (three) times daily as needed. (Patient not taking: Reported on 10/09/2020) 60 tablet 0  . lidocaine (XYLOCAINE) 2 % solution Use as directed 15 mLs in the mouth or throat every 4 (four) hours as needed for mouth pain. May use for one week 120 mL 0  . oseltamivir (TAMIFLU) 75  MG capsule Take 1 capsule (75 mg total) by mouth every 12 (twelve) hours. (Patient not taking: No sig reported) 10 capsule 0  . pantoprazole (PROTONIX) 40 MG tablet Take 1 tablet (40 mg total) by mouth 2 (two) times daily. (Patient not taking: No sig reported) 90 tablet 2  . pantoprazole (PROTONIX) 40 MG tablet Take 1 tablet (40 mg total) by mouth 2 (two) times daily. (Patient not taking: No sig reported) 90 tablet 3  . sucralfate (CARAFATE) 1 g tablet Take 1 tablet (1 g total) by mouth 4 (four) times daily as needed. (Patient not taking: No sig reported) 56 tablet 0  . sucralfate (CARAFATE) 1 GM/10ML suspension Take 10 mLs (1 g total) by mouth 4 (four) times daily -  with meals and at bedtime.  (Patient not taking: No sig reported) 840 mL 0   Facility-Administered Medications Prior to Visit  Medication Dose Route Frequency Provider Last Rate Last Admin  . 0.9 %  sodium chloride infusion  500 mL Intravenous Once Nandigam, Kavitha V, MD        No Known Allergies  ROS Review of Systems  Constitutional: Positive for fatigue.  HENT: Negative.   Eyes: Negative.   Respiratory: Positive for shortness of breath.   Cardiovascular: Negative.   Gastrointestinal: Negative.   Endocrine: Negative.   Genitourinary: Negative.   Musculoskeletal: Negative.   Skin: Negative.   Allergic/Immunologic: Negative.   Neurological: Negative.   Psychiatric/Behavioral: The patient is nervous/anxious.   All other systems reviewed and are negative.     Objective:    Physical Exam Vitals and nursing note reviewed.  Constitutional:      General: She is not in acute distress.    Appearance: Normal appearance. She is normal weight. She is not ill-appearing, toxic-appearing or diaphoretic.  Cardiovascular:     Rate and Rhythm: Normal rate and regular rhythm.     Heart sounds: Normal heart sounds. No murmur heard. No friction rub. No gallop.   Pulmonary:     Effort: Pulmonary effort is normal. No respiratory distress.     Breath sounds: Normal breath sounds. No stridor. No wheezing, rhonchi or rales.  Chest:     Chest wall: No tenderness.  Skin:    General: Skin is warm and dry.  Neurological:     General: No focal deficit present.     Mental Status: She is alert and oriented to person, place, and time. Mental status is at baseline.  Psychiatric:        Mood and Affect: Mood normal.        Behavior: Behavior normal.        Thought Content: Thought content normal.        Judgment: Judgment normal.     Pulse 70   Temp 98.3 F (36.8 C) (Temporal)   Resp 18   Ht 5\' 6"  (1.676 m)   Wt 195 lb 9.6 oz (88.7 kg)   SpO2 99%   BMI 31.57 kg/m  Wt Readings from Last 3 Encounters:  10/09/20  195 lb 9.6 oz (88.7 kg)  10/08/20 193 lb (87.5 kg)  04/01/20 186 lb (84.4 kg)     There are no preventive care reminders to display for this patient.  There are no preventive care reminders to display for this patient.  Lab Results  Component Value Date   TSH 0.977 06/16/2019   Lab Results  Component Value Date   WBC 6.5 10/08/2020   HGB 7.8 (L) 10/08/2020  HCT 27.5 (L) 10/08/2020   MCV 63.8 (L) 10/08/2020   PLT 270 10/08/2020   Lab Results  Component Value Date   NA 137 10/08/2020   K 3.7 10/08/2020   CO2 22 10/08/2020   GLUCOSE 82 10/08/2020   BUN 13 10/08/2020   CREATININE 0.74 10/08/2020   BILITOT 0.4 06/16/2019   ALKPHOS 60 06/16/2019   AST 18 06/16/2019   ALT 12 06/16/2019   PROT 7.2 06/16/2019   ALBUMIN 4.1 06/16/2019   CALCIUM 8.9 10/08/2020   ANIONGAP 9 10/08/2020   No results found for: CHOL No results found for: HDL No results found for: LDLCALC No results found for: TRIG No results found for: CHOLHDL No results found for: HGBA1C    Assessment & Plan:   Problem List Items Addressed This Visit   None   Visit Diagnoses    Severe anemia    -  Primary   Relevant Orders   CBC with Differential/Platelet   Comprehensive metabolic panel   X51 and Folate Panel   Iron, TIBC and Ferritin Panel   Pathologist smear review      No orders of the defined types were placed in this encounter.   Follow-up: No follow-ups on file.   PLAN  Exam reassuring  Labs collected. Will follow up with the patient as warranted.  Referral to hematology already placed  Start iron supplements twice daily as tolerated. Reviewed tips for better tolerance of po iron.  Patient encouraged to call clinic with any questions, comments, or concerns.   [name], NP

## 2020-10-10 LAB — IRON,TIBC AND FERRITIN PANEL
%SAT: 4 % (calc) — ABNORMAL LOW (ref 16–45)
Ferritin: 3 ng/mL — ABNORMAL LOW (ref 16–154)
Iron: 14 ug/dL — ABNORMAL LOW (ref 40–190)
TIBC: 400 mcg/dL (calc) (ref 250–450)

## 2020-10-10 LAB — PATHOLOGIST SMEAR REVIEW

## 2020-10-11 ENCOUNTER — Encounter: Payer: Self-pay | Admitting: Registered Nurse

## 2020-10-11 LAB — COMPREHENSIVE METABOLIC PANEL
ALT: 8 U/L (ref 0–35)
AST: 14 U/L (ref 0–37)
Albumin: 4.3 g/dL (ref 3.5–5.2)
Alkaline Phosphatase: 66 U/L (ref 39–117)
BUN: 13 mg/dL (ref 6–23)
CO2: 18 mEq/L — ABNORMAL LOW (ref 19–32)
Calcium: 9 mg/dL (ref 8.4–10.5)
Chloride: 108 mEq/L (ref 96–112)
Creatinine, Ser: 0.73 mg/dL (ref 0.40–1.20)
GFR: 112.69 mL/min (ref >60.00)
Glucose, Bld: 81 mg/dL (ref 70–99)
Potassium: 4.4 mEq/L (ref 3.5–5.1)
Sodium: 140 mEq/L (ref 135–145)
Total Bilirubin: 0.5 mg/dL (ref 0.2–1.2)
Total Protein: 7.6 g/dL (ref 6.0–8.3)

## 2020-10-15 ENCOUNTER — Telehealth: Payer: Self-pay | Admitting: Hematology

## 2020-10-15 ENCOUNTER — Telehealth: Payer: Self-pay | Admitting: Registered Nurse

## 2020-10-15 NOTE — Telephone Encounter (Signed)
Pt called in to schedule an appt with Dr. Irene Limbo per her PCP request. I spoke to desk nurse to make sure this was okay as pt hadn't been seen since 2020. Per RN Beth, okay to schedule with Dr. Irene Limbo. Pt aware of date and time of appt.

## 2020-10-15 NOTE — Telephone Encounter (Signed)
Pt called in stating that she doesn't go to see her oncologist until 07/5 she is still having some shortness of breath and dizzy spells. She wanted to know what she should do in the meantime?   Please advise

## 2020-10-16 NOTE — Telephone Encounter (Signed)
If we could check [facility] or other hematology groups that would be great - her anemia is pretty severe  Thank you  Denice Paradise

## 2020-10-16 NOTE — Telephone Encounter (Signed)
If there are other hematology groups available between now and then that would be preferable to having her wait  Thanks  Rich

## 2020-10-16 NOTE — Telephone Encounter (Signed)
[facility] will reach out to the patient to get her scheduled sooner. Patient notified.

## 2020-10-18 ENCOUNTER — Telehealth: Payer: Self-pay | Admitting: Physician Assistant

## 2020-10-18 NOTE — Telephone Encounter (Signed)
Changed appt to a virtual visit due to provider being out of the office. Called pt, no answer. Left msg letting pt know visit was changed to a virtual visit and she will not need to come in the office. I left my contact information for pt to call back with any questions.

## 2020-10-21 ENCOUNTER — Other Ambulatory Visit: Payer: Self-pay | Admitting: *Deleted

## 2020-10-21 ENCOUNTER — Encounter: Payer: Self-pay | Admitting: Physician Assistant

## 2020-10-21 ENCOUNTER — Inpatient Hospital Stay: Payer: Medicaid Other | Attending: Physician Assistant | Admitting: Physician Assistant

## 2020-10-21 DIAGNOSIS — E538 Deficiency of other specified B group vitamins: Secondary | ICD-10-CM | POA: Diagnosis not present

## 2020-10-21 DIAGNOSIS — D500 Iron deficiency anemia secondary to blood loss (chronic): Secondary | ICD-10-CM

## 2020-10-21 DIAGNOSIS — F17210 Nicotine dependence, cigarettes, uncomplicated: Secondary | ICD-10-CM | POA: Insufficient documentation

## 2020-10-21 DIAGNOSIS — R0602 Shortness of breath: Secondary | ICD-10-CM | POA: Insufficient documentation

## 2020-10-21 DIAGNOSIS — N920 Excessive and frequent menstruation with regular cycle: Secondary | ICD-10-CM | POA: Insufficient documentation

## 2020-10-21 DIAGNOSIS — Z79899 Other long term (current) drug therapy: Secondary | ICD-10-CM | POA: Insufficient documentation

## 2020-10-21 DIAGNOSIS — R5383 Other fatigue: Secondary | ICD-10-CM | POA: Insufficient documentation

## 2020-10-21 MED ORDER — VITAMIN B-12 1000 MCG PO TABS: 1000.0000 ug | ORAL_TABLET | Freq: Every day | ORAL | 3 refills | Status: DC

## 2020-10-21 NOTE — Progress Notes (Signed)
HEMATOLOGY/ONCOLOGY TELEHEALTH VISIT:   Date: 10/21/2020  Patient Care Team: [name], NP as PCP - General (Adult Health Nurse Practitioner)  CHIEF COMPLAINT: Iron deficiency anemia  INTERVAL HISTORY:  Katie Powell is a 30 y.o. female here for a follow up for iron deficiency anemia. Patient was last seen in clinic by Dr.  Limbo on  02/23/2019. At that time, patient was receiving IV venofer for iron deficiency anemia during her pregnancy. Katie Powell had a vaginal delivery on 03/25/2019. After her delivery, patient notes that she did not take her iron tablets consistently or continued to monitor her iron levels.   On 10/08/2020, patient presented to the ED after a syncopal episode. Labs from 10/08/2020 revealed microcytic anemia with hemoglobin of 7.8, MCV 63.8.Labs from 10/09/2020 revealed iron deficiency anemia with iron of 14, iron saturation at 4% and ferritin of 3. Patient was started on ferrous sulfate 325 mg twice daily.   On exam today, Katie Powell reports severe fatigue. She continues to work and go to school full time. Patient denies subsequent syncopal episodes. Patient has a good appetite but admits to craving cornstarch. She denies any dietary restrictions. Patient denies any nausea, vomiting or abdominal pain. She denies any constipation or diarrhea. She denies easy bruising or signs of bleeding except for heavy menstrual bleeding. Patient has a menstrual cycle every 28 day but cycle can last 7-10 days. She reports that the first 3-4 days of her menstrual cycle involve heavy bleeding. She has to use a tampon and pad which she changes every hour. She notes there have been times when she has bled through her clothes and sheets to the mattress.  Patient has tried the depo shot but that made her bleeding worse. She plans to follow up with her OB/GYN to discuss other interventions to help improve her menstrual bleeding. Patient has progressive shortness of breath at rest and  exertion. Patient denies any fevers, chills, night sweats, chest pain or cough. She has no other complaints.  MEDICAL HISTORY:  Past Medical History:  Diagnosis Date   Anemia    Anxiety    Blood transfusion without reported diagnosis    Establishing care with new doctor, encounter for 03/09/2019   GERD (gastroesophageal reflux disease)     SURGICAL HISTORY: Past Surgical History:  Procedure Laterality Date   EYE SURGERY     FINGER SURGERY      SOCIAL HISTORY: Social History   Socioeconomic History   Marital status: Single    Spouse name: Not on file   Number of children: Not on file   Years of education: Not on file   Highest education level: Not on file  Occupational History   Not on file  Tobacco Use   Smoking status: Every Day    Packs/day: 0.50    Pack years: 0.00    Types: Cigarettes   Smokeless tobacco: Never  Vaping Use   Vaping Use: Never used  Substance and Sexual Activity   Alcohol use: Not Currently   Drug use: No   Sexual activity: Yes    Birth control/protection: Injection, None  Other Topics Concern   Not on file  Social History Narrative   Not on file   Social Determinants of Health   Financial Resource Strain: Not on file  Food Insecurity: Not on file  Transportation Needs: Not on file  Physical Activity: Not on file  Stress: Not on file  Social Connections: Not on file  Intimate Partner Violence: Not  on file    FAMILY HISTORY: Family History  Adopted: Yes    ALLERGIES:  has No Known Allergies.  MEDICATIONS:  Current Outpatient Medications  Medication Sig Dispense Refill   acetaminophen (TYLENOL) 500 MG tablet Take 1 tablet (500 mg total) by mouth every 6 (six) hours as needed. (Patient not taking: No sig reported) 30 tablet 0   aluminum-magnesium hydroxide 200-200 MG/5ML suspension Take 15 mLs by mouth 3 (three) times daily with meals as needed for indigestion. (Patient not taking: No sig reported) 355 mL 0   ferrous sulfate 325  (65 FE) MG tablet Take 1 tablet (325 mg total) by mouth 2 (two) times daily with a meal. 180 tablet 0   fluticasone (FLONASE) 50 MCG/ACT nasal spray Place 1 spray into both nostrils daily. (Patient not taking: No sig reported) 11.1 mL 2   hydrOXYzine (ATARAX/VISTARIL) 50 MG tablet Take 1 tablet (50 mg total) by mouth 3 (three) times daily as needed. (Patient not taking: Reported on 10/09/2020) 60 tablet 0   lidocaine (XYLOCAINE) 2 % solution Use as directed 15 mLs in the mouth or throat every 4 (four) hours as needed for mouth pain. May use for one week 120 mL 0   oseltamivir (TAMIFLU) 75 MG capsule Take 1 capsule (75 mg total) by mouth every 12 (twelve) hours. (Patient not taking: No sig reported) 10 capsule 0   pantoprazole (PROTONIX) 40 MG tablet Take 1 tablet (40 mg total) by mouth 2 (two) times daily. (Patient not taking: No sig reported) 90 tablet 2   pantoprazole (PROTONIX) 40 MG tablet Take 1 tablet (40 mg total) by mouth 2 (two) times daily. (Patient not taking: No sig reported) 90 tablet 3   sucralfate (CARAFATE) 1 g tablet Take 1 tablet (1 g total) by mouth 4 (four) times daily as needed. (Patient not taking: No sig reported) 56 tablet 0   sucralfate (CARAFATE) 1 GM/10ML suspension Take 10 mLs (1 g total) by mouth 4 (four) times daily -  with meals and at bedtime. (Patient not taking: No sig reported) 840 mL 0   Current Facility-Administered Medications  Medication Dose Route Frequency Provider Last Rate Last Admin   0.9 %  sodium chloride infusion  500 mL Intravenous Once Nandigam, Venia Minks, MD        REVIEW OF SYSTEMS:   Review of Systems  Constitutional:  Positive for malaise/fatigue. Negative for chills, fever and weight loss.  HENT:  Negative for congestion, nosebleeds and sore throat.   Eyes:  Negative for blurred vision.  Respiratory:  Positive for shortness of breath. Negative for cough, hemoptysis and wheezing.   Cardiovascular:  Negative for chest pain, palpitations and leg  swelling.  Gastrointestinal:  Negative for abdominal pain, blood in stool, constipation, diarrhea, melena, nausea and vomiting.  Genitourinary:  Negative for dysuria and hematuria.  Skin:  Negative for itching and rash.  Endo/Heme/Allergies:  Does not bruise/bleed easily.     PHYSICAL EXAMINATION:  There were no vitals filed for this visit. Wt Readings from Last 3 Encounters:  10/09/20 195 lb 9.6 oz (88.7 kg)  10/08/20 193 lb (87.5 kg)  04/01/20 186 lb (84.4 kg)   There is no height or weight on file to calculate BMI.    Exam was not performed since it was a telehealth visit.   LABORATORY DATA:  I have reviewed the data as listed  . CBC Latest Ref Rng & Units 10/09/2020 10/08/2020 11/28/2019  WBC 4.0 - 10.5 K/uL 5.3 6.5  5.1  Hemoglobin 12.0 - 15.0 g/dL 7.8 cL(LL) 7.8(L) 11.2  Hematocrit 36.0 - 46.0 % 25.8 aL(L) 27.5(L) 34.1  Platelets 150.0 - 400.0 K/uL 321.0 270 271    . CMP Latest Ref Rng & Units 10/09/2020 10/08/2020 06/16/2019  Glucose 70 - 99 mg/dL 81 82 90  BUN 6 - 23 mg/dL 13 13 11   Creatinine 0.40 - 1.20 mg/dL 0.73 0.74 0.73  Sodium 135 - 145 mEq/L 140 137 137  Potassium 3.5 - 5.1 mEq/L 4.4 3.7 3.8  Chloride 96 - 112 mEq/L 108 106 104  CO2 19 - 32 mEq/L 18(L) 22 20  Calcium 8.4 - 10.5 mg/dL 9.0 8.9 9.4  Total Protein 6.0 - 8.3 g/dL 7.6 - 7.2  Total Bilirubin 0.2 - 1.2 mg/dL 0.5 - 0.4  Alkaline Phos 39 - 117 U/L 66 - 60  AST 0 - 37 U/L 14 - 18  ALT 0 - 35 U/L 8 - 12   . Lab Results  Component Value Date   IRON 14 (L) 10/09/2020   TIBC 400 10/09/2020   IRONPCTSAT 4 (L) 10/09/2020   (Iron and TIBC)  Lab Results  Component Value Date   FERRITIN 3 (L) 10/09/2020     ASSESSMENT & PLAN:  Katie Powell is a 30 y.o. female presenting for a telehealth visit for iron deficiency anemia.   1. Iron Deficiency Anemia 2/2 menstrual bleeding: -Patient received IV feraheme and venofer during her pregnancy from 01/19/2019-02/03/2019. -Patient presented to ED on  10/08/2020 due to syncopal episode. Labs  from 10/08/2020 revealed microcytic anemia with hemoglobin of 7.8, MCV 63.8.Labs from 10/09/2020 revealed iron deficiency anemia with iron of 14, iron saturation at 4% and ferritin of 3. Patient was started on ferrous sulfate 325 mg twice daily.  -Due to presenting symptoms including fatigue and shortness of breath, recommend for 1 unit of pRBC this week and then arrange for IV feraheme x 2 doses.  -Currently on ferrous sulfate 325 mg PO twice daily. Reviewed that if patient develops any GI intolerance, it is okay to take once daily. Recommend to take oral iron tablets with a glass of orange juice to help improve absorption.  -Reviewed foods that are iron rich to incorporate into diet including liver and red meat.  -Patient will follow with her OB/GYN to discuss interventions to help improve menstrual bleeding.  -Return to the clinic 4 weeks after patient receives IV ferehame to repeat labs.   2. Borderline B12 deficiency: -Labs from 10/09/2020 shows borderline B12 deficiency.  -Recommend to start oral B12 1000 mcg tablet once daily.   All of the patient's questions were answered with apparent satisfaction. The patient knows to call the clinic with any problems, questions or concerns.  I have spent a total of 35 minutes minutes of non-face-to-face time, preparing to see the patient, obtaining and/or reviewing separately obtained history,counseling and educating the patient, ordering medications, documenting clinical information in the electronic health record,and care coordination.

## 2020-10-22 ENCOUNTER — Telehealth: Payer: Self-pay | Admitting: Registered Nurse

## 2020-10-22 NOTE — Telephone Encounter (Signed)
Patient needs a letter from [place] detailing what is happening as far as patient's anemia.

## 2020-10-24 ENCOUNTER — Inpatient Hospital Stay: Payer: Medicaid Other

## 2020-10-24 ENCOUNTER — Other Ambulatory Visit: Payer: Self-pay

## 2020-10-24 DIAGNOSIS — D500 Iron deficiency anemia secondary to blood loss (chronic): Secondary | ICD-10-CM

## 2020-10-24 DIAGNOSIS — R0602 Shortness of breath: Secondary | ICD-10-CM | POA: Diagnosis not present

## 2020-10-24 DIAGNOSIS — N920 Excessive and frequent menstruation with regular cycle: Secondary | ICD-10-CM | POA: Diagnosis not present

## 2020-10-24 DIAGNOSIS — R5383 Other fatigue: Secondary | ICD-10-CM | POA: Diagnosis not present

## 2020-10-24 DIAGNOSIS — Z79899 Other long term (current) drug therapy: Secondary | ICD-10-CM | POA: Diagnosis not present

## 2020-10-24 DIAGNOSIS — F17210 Nicotine dependence, cigarettes, uncomplicated: Secondary | ICD-10-CM | POA: Diagnosis not present

## 2020-10-24 LAB — CBC WITH DIFFERENTIAL (CANCER CENTER ONLY)
Abs Immature Granulocytes: 0.02 10*3/uL (ref 0.00–0.07)
Basophils Absolute: 0 10*3/uL (ref 0.0–0.1)
Basophils Relative: 1 %
Eosinophils Absolute: 0.2 10*3/uL (ref 0.0–0.5)
Eosinophils Relative: 3 %
HCT: 30.3 % — ABNORMAL LOW (ref 36.0–46.0)
Hemoglobin: 8.7 g/dL — ABNORMAL LOW (ref 12.0–15.0)
Immature Granulocytes: 0 %
Lymphocytes Relative: 34 %
Lymphs Abs: 2.3 10*3/uL (ref 0.7–4.0)
MCH: 19.5 pg — ABNORMAL LOW (ref 26.0–34.0)
MCHC: 28.7 g/dL — ABNORMAL LOW (ref 30.0–36.0)
MCV: 67.9 fL — ABNORMAL LOW (ref 80.0–100.0)
Monocytes Absolute: 0.4 10*3/uL (ref 0.1–1.0)
Monocytes Relative: 5 %
Neutro Abs: 4 10*3/uL (ref 1.7–7.7)
Neutrophils Relative %: 57 %
Platelet Count: 400 10*3/uL (ref 150–400)
RBC: 4.46 MIL/uL (ref 3.87–5.11)
RDW: 25.8 % — ABNORMAL HIGH (ref 11.5–15.5)
WBC Count: 6.9 10*3/uL (ref 4.0–10.5)
nRBC: 0 % (ref 0.0–0.2)

## 2020-10-24 LAB — PREPARE RBC (CROSSMATCH)

## 2020-10-24 LAB — SAMPLE TO BLOOD BANK

## 2020-10-25 ENCOUNTER — Inpatient Hospital Stay: Payer: Medicaid Other

## 2020-10-25 DIAGNOSIS — D500 Iron deficiency anemia secondary to blood loss (chronic): Secondary | ICD-10-CM | POA: Diagnosis not present

## 2020-10-25 MED ORDER — ACETAMINOPHEN 325 MG PO TABS
ORAL_TABLET | ORAL | Status: AC
  Filled 2020-10-25: qty 2

## 2020-10-25 MED ORDER — SODIUM CHLORIDE 0.9% IV SOLUTION
250.0000 mL | Freq: Once | INTRAVENOUS | Status: AC
Start: 2020-10-25 — End: 2020-10-25
  Administered 2020-10-25: 250 mL via INTRAVENOUS
  Filled 2020-10-25: qty 250

## 2020-10-25 MED ORDER — SODIUM CHLORIDE 0.9% FLUSH
10.0000 mL | INTRAVENOUS | Status: DC | PRN
  Filled 2020-10-25: qty 10

## 2020-10-25 MED ORDER — ACETAMINOPHEN 325 MG PO TABS
650.0000 mg | ORAL_TABLET | Freq: Once | ORAL | Status: AC
  Administered 2020-10-25: 650 mg via ORAL

## 2020-10-27 LAB — TYPE AND SCREEN
ABO/RH(D): O POS
Antibody Screen: POSITIVE
DAT, IgG: NEGATIVE
Donor AG Type: NEGATIVE
Unit division: 0

## 2020-10-27 LAB — BPAM RBC
Blood Product Expiration Date: 202207182359
ISSUE DATE / TIME: 202206171330
Unit Type and Rh: 5100

## 2020-10-28 ENCOUNTER — Telehealth: Payer: Self-pay | Admitting: Physician Assistant

## 2020-10-28 NOTE — Telephone Encounter (Signed)
Called and confirmed scheduled appts with patient.

## 2020-11-01 ENCOUNTER — Other Ambulatory Visit: Payer: Self-pay

## 2020-11-01 ENCOUNTER — Inpatient Hospital Stay: Payer: Medicaid Other

## 2020-11-01 VITALS — BP 117/65 | HR 84 | Temp 98.4°F | Resp 17

## 2020-11-01 DIAGNOSIS — D500 Iron deficiency anemia secondary to blood loss (chronic): Secondary | ICD-10-CM

## 2020-11-01 MED ORDER — SODIUM CHLORIDE 0.9 % IV SOLN
Freq: Once | INTRAVENOUS | Status: AC
Start: 2020-11-01 — End: 2020-11-01
  Filled 2020-11-01: qty 250

## 2020-11-01 MED ORDER — SODIUM CHLORIDE 0.9 % IV SOLN
510.0000 mg | Freq: Once | INTRAVENOUS | Status: AC
  Administered 2020-11-01: 510 mg via INTRAVENOUS
  Filled 2020-11-01: qty 510

## 2020-11-01 NOTE — Progress Notes (Signed)
.  thi

## 2020-11-01 NOTE — Patient Instructions (Signed)

## 2020-11-08 ENCOUNTER — Inpatient Hospital Stay: Payer: Medicaid Other | Attending: Physician Assistant

## 2020-11-08 ENCOUNTER — Other Ambulatory Visit: Payer: Self-pay

## 2020-11-08 VITALS — BP 106/62 | HR 78 | Temp 98.9°F | Resp 17

## 2020-11-08 DIAGNOSIS — D500 Iron deficiency anemia secondary to blood loss (chronic): Secondary | ICD-10-CM | POA: Diagnosis not present

## 2020-11-08 DIAGNOSIS — N920 Excessive and frequent menstruation with regular cycle: Secondary | ICD-10-CM | POA: Diagnosis present

## 2020-11-08 MED ORDER — SODIUM CHLORIDE 0.9 % IV SOLN
Freq: Once | INTRAVENOUS | Status: AC
Start: 2020-11-08 — End: 2020-11-08
  Filled 2020-11-08: qty 250

## 2020-11-08 MED ORDER — FERUMOXYTOL INJECTION 510 MG/17 ML
510.0000 mg | Freq: Once | INTRAVENOUS | Status: AC
  Administered 2020-11-08: 510 mg via INTRAVENOUS
  Filled 2020-11-08: qty 510

## 2020-11-08 NOTE — Progress Notes (Signed)
Pt declined to stay for post infusion observation period. Pt stated she has tolerated medication multiple times prior without difficulty. Pt aware to call clinic with any questions or concerns. Pt verbalized understanding and had no further questions.  ? ?

## 2020-11-08 NOTE — Patient Instructions (Signed)
Ferumoxytol injection What is this medication? FERUMOXYTOL is an iron complex. Iron is used to make healthy red blood cells, which carry oxygen and nutrients throughout the body. This medicine is used totreat iron deficiency anemia. This medicine may be used for other purposes; ask your health care provider orpharmacist if you have questions. COMMON BRAND NAME(S): Feraheme What should I tell my care team before I take this medication? They need to know if you have any of these conditions: anemia not caused by low iron levels high levels of iron in the blood magnetic resonance imaging (MRI) test scheduled an unusual or allergic reaction to iron, other medicines, foods, dyes, or preservatives pregnant or trying to get pregnant breast-feeding How should I use this medication? This medicine is for injection into a vein. It is given by a health careprofessional in a hospital or clinic setting. Talk to your pediatrician regarding the use of this medicine in children.Special care may be needed. Overdosage: If you think you have taken too much of this medicine contact apoison control center or emergency room at once. NOTE: This medicine is only for you. Do not share this medicine with others. What if I miss a dose? It is important not to miss your dose. Call your doctor or health careprofessional if you are unable to keep an appointment. What may interact with this medication? This medicine may interact with the following medications: other iron products This list may not describe all possible interactions. Give your health care provider a list of all the medicines, herbs, non-prescription drugs, or dietary supplements you use. Also tell them if you smoke, drink alcohol, or use illegaldrugs. Some items may interact with your medicine. What should I watch for while using this medication? Visit your doctor or healthcare professional regularly. Tell your doctor or healthcare professional if your  symptoms do not start to get better or if theyget worse. You may need blood work done while you are taking this medicine. You may need to follow a special diet. Talk to your doctor. Foods that contain iron include: whole grains/cereals, dried fruits, beans, or peas, leafy greenvegetables, and organ meats (liver, kidney). What side effects may I notice from receiving this medication? Side effects that you should report to your doctor or health care professionalas soon as possible: allergic reactions like skin rash, itching or hives, swelling of the face, lips, or tongue breathing problems changes in blood pressure feeling faint or lightheaded, falls fever or chills flushing, sweating, or hot feelings swelling of the ankles or feet Side effects that usually do not require medical attention (report to yourdoctor or health care professional if they continue or are bothersome): diarrhea headache nausea, vomiting stomach pain This list may not describe all possible side effects. Call your doctor for medical advice about side effects. You may report side effects to FDA at1-800-FDA-1088. Where should I keep my medication? This drug is given in a hospital or clinic and will not be stored at home. NOTE: This sheet is a summary. It may not cover all possible information. If you have questions about this medicine, talk to your doctor, pharmacist, orhealth care provider.  2022 Elsevier/Gold Standard (2016-06-15 20:21:10)  

## 2020-11-12 ENCOUNTER — Ambulatory Visit: Payer: Medicaid Other | Admitting: Hematology

## 2020-11-15 ENCOUNTER — Other Ambulatory Visit: Payer: Self-pay

## 2020-11-15 ENCOUNTER — Encounter: Payer: Self-pay | Admitting: Registered Nurse

## 2020-11-15 ENCOUNTER — Ambulatory Visit: Payer: Medicaid Other | Admitting: Registered Nurse

## 2020-11-15 VITALS — BP 104/86 | HR 92 | Temp 98.2°F | Resp 18 | Ht 66.0 in | Wt 197.0 lb

## 2020-11-15 DIAGNOSIS — D649 Anemia, unspecified: Secondary | ICD-10-CM

## 2020-11-15 DIAGNOSIS — J310 Chronic rhinitis: Secondary | ICD-10-CM | POA: Diagnosis not present

## 2020-11-15 DIAGNOSIS — Z3009 Encounter for other general counseling and advice on contraception: Secondary | ICD-10-CM | POA: Diagnosis not present

## 2020-11-15 MED ORDER — DESOGESTREL-ETHINYL ESTRADIOL 0.15-30 MG-MCG PO TABS: 1.0000 | ORAL_TABLET | Freq: Every day | ORAL | 11 refills | Status: DC

## 2020-11-15 MED ORDER — AZELASTINE HCL 0.1 % NA SOLN: 1.0000 | Freq: Two times a day (BID) | NASAL | 12 refills | Status: DC

## 2020-11-15 NOTE — Patient Instructions (Addendum)
Ms. Heidel -   Doristine Devoid to see you. Glad you're feeling better.  Labs today should be back this afternoon or this evening. I'll call with any acute concerns. MCV, MCH,MCHC may take another few weeks to rebound, but we should see Hgb and HCT improve. Let's plan to repeat (can be lab only visit) in about 2 months, sooner if you develop symptoms.  Starting on birth control - pills - take one every day, if you miss one, take it as soon as you remember even if that means taking 2 in one day. If you miss more than one, just take one as soon as you remember, even if that means taking 2 in one day. Do not take more than 2 in one day. Okay to skip the last 7 days of a pack (the placebo week during which you would get your menses) and start a new pack that week instead. This can prevent your period from occurring.  Try azelastine for nasal inflammation. Ok to use with flonase - but recommend against using flonase more than 1-2 sprays each nostril daily. Can try OTC allergy relief like cetirizine or something along the lines of a saline sinus rinse to see if this helps. If this is persistent or worsening, let me know - can get you in to see an ENT  Thank you  Rich     If you have lab work done today you will be contacted with your lab results within the next 2 weeks.  If you have not heard from [place] then please contact us. The fastest way to get your results is to register for My Chart.   IF you received an x-ray today, you will receive an invoice from [facility]. Please contact [facility] at [phone] with questions or concerns regarding your invoice.   IF you received labwork today, you will receive an invoice from [facility]. Please contact [facility] at [phone] with questions or concerns regarding your invoice.   Our billing staff will not be able to assist you with questions regarding bills from these companies.  You will be contacted with the lab results as soon as they are  available. The fastest way to get your results is to activate your My Chart account. Instructions are located on the last page of this paperwork. If you have not heard from [place] regarding the results in 2 weeks, please contact this office.

## 2020-11-15 NOTE — Progress Notes (Signed)
Established Patient Office Visit  Subjective:  Patient ID: Katie Powell, female    DOB: 03-01-1991  Age: 30 y.o. MRN: [id number]  CC:  Chief Complaint  Patient presents with   Follow-up    Patient states she is here for follow up on anemia and also discuss some inflammation inside her nose that's making it hard to breathe     HPI Katie Powell presents for follow up   Recently had blood transfusion and iron transfusion for IDA Labs indicate improvement, recommend by heme to follow up today No ongoing symptoms, feeling much improved Continues daily oral iron supplement  Concerns today: Birth control Has been on dmpa injection once in past - had heavy irregular bleeding Has been on COCs in remote past. Tolerated well. Would be interested in starting again She has been having heavy menses and would like to try to limit these.  Rhinitis Ongoing for a few months Has tried flonase with limited relief Feels swelling, irritation, occasional feels blocked in one nare or the other Hx of allergies No sinus pressure No other respiratory symptoms  Past Medical History:  Diagnosis Date   Anemia    Anxiety    Blood transfusion without reported diagnosis    Establishing care with new doctor, encounter for 03/09/2019    Past Surgical History:  Procedure Laterality Date   EYE SURGERY     FINGER SURGERY      Family History  Adopted: Yes    Social History   Socioeconomic History   Marital status: Single    Spouse name: Not on file   Number of children: Not on file   Years of education: Not on file   Highest education level: Not on file  Occupational History   Not on file  Tobacco Use   Smoking status: Every Day    Packs/day: 0.50    Pack years: 0.00    Types: Cigarettes   Smokeless tobacco: Never  Vaping Use   Vaping Use: Never used  Substance and Sexual Activity   Alcohol use: Not Currently   Drug use: No   Sexual activity: Yes    Birth  control/protection: Injection, None  Other Topics Concern   Not on file  Social History Narrative   Not on file   Social Determinants of Health   Financial Resource Strain: Not on file  Food Insecurity: Not on file  Transportation Needs: Not on file  Physical Activity: Not on file  Stress: Not on file  Social Connections: Not on file  Intimate Partner Violence: Not on file    Outpatient Medications Prior to Visit  Medication Sig Dispense Refill   ferrous sulfate 325 (65 FE) MG tablet Take 1 tablet (325 mg total) by mouth 2 (two) times daily with a meal. 180 tablet 0   vitamin B-12 (CYANOCOBALAMIN) 1000 MCG tablet Take 1 tablet (1,000 mcg total) by mouth daily. 30 tablet 3   acetaminophen (TYLENOL) 500 MG tablet Take 1 tablet (500 mg total) by mouth every 6 (six) hours as needed. (Patient not taking: No sig reported) 30 tablet 0   aluminum-magnesium hydroxide 200-200 MG/5ML suspension Take 15 mLs by mouth 3 (three) times daily with meals as needed for indigestion. (Patient not taking: No sig reported) 355 mL 0   fluticasone (FLONASE) 50 MCG/ACT nasal spray Place 1 spray into both nostrils daily. (Patient not taking: No sig reported) 11.1 mL 2   hydrOXYzine (ATARAX/VISTARIL) 50 MG tablet Take 1 tablet (50 mg  total) by mouth 3 (three) times daily as needed. (Patient not taking: No sig reported) 60 tablet 0   lidocaine (XYLOCAINE) 2 % solution Use as directed 15 mLs in the mouth or throat every 4 (four) hours as needed for mouth pain. May use for one week 120 mL 0   oseltamivir (TAMIFLU) 75 MG capsule Take 1 capsule (75 mg total) by mouth every 12 (twelve) hours. (Patient not taking: No sig reported) 10 capsule 0   pantoprazole (PROTONIX) 40 MG tablet Take 1 tablet (40 mg total) by mouth 2 (two) times daily. (Patient not taking: No sig reported) 90 tablet 2   pantoprazole (PROTONIX) 40 MG tablet Take 1 tablet (40 mg total) by mouth 2 (two) times daily. (Patient not taking: No sig reported) 90  tablet 3   sucralfate (CARAFATE) 1 g tablet Take 1 tablet (1 g total) by mouth 4 (four) times daily as needed. (Patient not taking: No sig reported) 56 tablet 0   sucralfate (CARAFATE) 1 GM/10ML suspension Take 10 mLs (1 g total) by mouth 4 (four) times daily -  with meals and at bedtime. (Patient not taking: No sig reported) 840 mL 0   Facility-Administered Medications Prior to Visit  Medication Dose Route Frequency Provider Last Rate Last Admin   0.9 %  sodium chloride infusion  500 mL Intravenous Once Nandigam, Venia Minks, MD        Not on File  ROS Review of Systems  Constitutional: Negative.   HENT:  Positive for rhinorrhea.   Eyes: Negative.   Respiratory: Negative.    Cardiovascular: Negative.   Gastrointestinal: Negative.   Genitourinary: Negative.   Musculoskeletal: Negative.   Skin: Negative.   Neurological: Negative.   Psychiatric/Behavioral: Negative.    All other systems reviewed and are negative.    Objective:    Physical Exam Vitals and nursing note reviewed.  Constitutional:      General: She is not in acute distress.    Appearance: Normal appearance. She is normal weight. She is not ill-appearing, toxic-appearing or diaphoretic.  HENT:     Nose: Rhinorrhea (rhinitis) present. No congestion.  Cardiovascular:     Rate and Rhythm: Normal rate and regular rhythm.     Heart sounds: Normal heart sounds. No murmur heard.   No friction rub. No gallop.  Pulmonary:     Effort: Pulmonary effort is normal. No respiratory distress.     Breath sounds: Normal breath sounds. No stridor. No wheezing, rhonchi or rales.  Chest:     Chest wall: No tenderness.  Skin:    General: Skin is warm and dry.  Neurological:     General: No focal deficit present.     Mental Status: She is alert and oriented to person, place, and time. Mental status is at baseline.  Psychiatric:        Mood and Affect: Mood normal.        Behavior: Behavior normal.        Thought Content: Thought  content normal.        Judgment: Judgment normal.    BP 104/86   Pulse 92   Temp 98.2 F (36.8 C) (Temporal)   Resp 18   Ht 5\' 6"  (1.676 m)   Wt 197 lb (89.4 kg)   SpO2 99%   BMI 31.80 kg/m  Wt Readings from Last 3 Encounters:  11/15/20 197 lb (89.4 kg)  10/09/20 195 lb 9.6 oz (88.7 kg)  10/08/20 193 lb (87.5 kg)  There are no preventive care reminders to display for this patient.  There are no preventive care reminders to display for this patient.  Lab Results  Component Value Date   TSH 0.977 06/16/2019   Lab Results  Component Value Date   WBC 6.9 10/24/2020   HGB 8.7 (L) 10/24/2020   HCT 30.3 (L) 10/24/2020   MCV 67.9 (L) 10/24/2020   PLT 400 10/24/2020   Lab Results  Component Value Date   NA 140 10/09/2020   K 4.4 10/09/2020   CO2 18 (L) 10/09/2020   GLUCOSE 81 10/09/2020   BUN 13 10/09/2020   CREATININE 0.73 10/09/2020   BILITOT 0.5 10/09/2020   ALKPHOS 66 10/09/2020   AST 14 10/09/2020   ALT 8 10/09/2020   PROT 7.6 10/09/2020   ALBUMIN 4.3 10/09/2020   CALCIUM 9.0 10/09/2020   ANIONGAP 9 10/08/2020   GFR 112.69 10/09/2020   No results found for: CHOL No results found for: HDL No results found for: LDLCALC No results found for: TRIG No results found for: CHOLHDL No results found for: HGBA1C    Assessment & Plan:   Problem List Items Addressed This Visit   None Visit Diagnoses     Chronic rhinitis    -  Primary   Relevant Medications   azelastine (ASTELIN) 0.1 % nasal spray   Birth control counseling       Relevant Medications   desogestrel-ethinyl estradiol (APRI) 0.15-30 MG-MCG tablet   Severe anemia       Relevant Orders   CBC with Differential/Platelet   Iron, TIBC and Ferritin Panel   B12 and Folate Panel   Methylmalonic Acid   Homocysteine   CBC   Iron, TIBC and Ferritin Panel       Meds ordered this encounter  Medications   desogestrel-ethinyl estradiol (APRI) 0.15-30 MG-MCG tablet    Sig: Take 1 tablet by  mouth daily.    Dispense:  28 tablet    Refill:  11    Order Specific Question:   Supervising Provider    Answer:   Carlota Raspberry, JEFFREY R [[id number]]   azelastine (ASTELIN) 0.1 % nasal spray    Sig: Place 1 spray into both nostrils 2 (two) times daily. Use in each nostril as directed    Dispense:  30 mL    Refill:  12    Order Specific Question:   Supervising Provider    Answer:   Carlota Raspberry, JEFFREY R [[id number]]    Follow-up: Return in about 8 weeks (around 01/10/2021) for lab only .   PLAN Start Apri daily. Reviewed risks and alternatives. Reviewed precautions if missing dose or doses. Reviewed ability to skip placebo week if she wants to skip menses. Return if she would like to discuss  changes in medication Try azelastine and OTC antihistamine for rhinitis. No apparent polyps or septal deviation at this time but can consider ENT referral if symptoms persist  Labs collected. Will follow up with the patient as warranted. Continue iron supplement Patient encouraged to call clinic with any questions, comments, or concerns.  I spent 32 minutes with this patient following up on procedure for chronic condition and addressing two novel concerns   [name], NP

## 2020-11-29 ENCOUNTER — Other Ambulatory Visit: Payer: Medicaid Other

## 2020-12-06 ENCOUNTER — Inpatient Hospital Stay: Payer: Medicaid Other

## 2020-12-06 ENCOUNTER — Inpatient Hospital Stay: Payer: Medicaid Other | Admitting: Hematology

## 2020-12-09 ENCOUNTER — Telehealth: Payer: Self-pay | Admitting: Hematology

## 2020-12-09 NOTE — Telephone Encounter (Signed)
Scheduled appts per 7/29 sch msg. Called pt, no answer. Left msg with appts date and times.

## 2020-12-18 ENCOUNTER — Telehealth: Payer: Self-pay | Admitting: Registered Nurse

## 2020-12-18 DIAGNOSIS — F410 Panic disorder [episodic paroxysmal anxiety] without agoraphobia: Secondary | ICD-10-CM

## 2020-12-20 ENCOUNTER — Telehealth: Payer: Self-pay | Admitting: Hematology

## 2020-12-20 NOTE — Telephone Encounter (Signed)
Left message with rescheduled upcoming appointment due to provider's emergency. 

## 2020-12-24 NOTE — Telephone Encounter (Signed)
Patient is requesting call back in regard at [phone].

## 2020-12-24 NOTE — Telephone Encounter (Signed)
Patient scheduled for a virtual visit on Friday.

## 2020-12-26 ENCOUNTER — Other Ambulatory Visit: Payer: Self-pay

## 2020-12-26 DIAGNOSIS — D500 Iron deficiency anemia secondary to blood loss (chronic): Secondary | ICD-10-CM

## 2020-12-27 ENCOUNTER — Inpatient Hospital Stay: Payer: Medicaid Other | Attending: Physician Assistant

## 2020-12-27 ENCOUNTER — Inpatient Hospital Stay (HOSPITAL_BASED_OUTPATIENT_CLINIC_OR_DEPARTMENT_OTHER): Payer: Medicaid Other | Admitting: Hematology

## 2020-12-27 ENCOUNTER — Other Ambulatory Visit: Payer: Medicaid Other

## 2020-12-27 ENCOUNTER — Telehealth (INDEPENDENT_AMBULATORY_CARE_PROVIDER_SITE_OTHER): Payer: Medicaid Other | Admitting: Registered Nurse

## 2020-12-27 ENCOUNTER — Other Ambulatory Visit: Payer: Self-pay

## 2020-12-27 ENCOUNTER — Ambulatory Visit: Payer: Medicaid Other | Admitting: Hematology

## 2020-12-27 ENCOUNTER — Encounter: Payer: Self-pay | Admitting: Registered Nurse

## 2020-12-27 VITALS — BP 112/76 | HR 75 | Temp 98.6°F | Resp 16 | Ht 66.0 in | Wt 202.8 lb

## 2020-12-27 DIAGNOSIS — F411 Generalized anxiety disorder: Secondary | ICD-10-CM

## 2020-12-27 DIAGNOSIS — F17210 Nicotine dependence, cigarettes, uncomplicated: Secondary | ICD-10-CM | POA: Insufficient documentation

## 2020-12-27 DIAGNOSIS — R55 Syncope and collapse: Secondary | ICD-10-CM | POA: Diagnosis not present

## 2020-12-27 DIAGNOSIS — N920 Excessive and frequent menstruation with regular cycle: Secondary | ICD-10-CM | POA: Insufficient documentation

## 2020-12-27 DIAGNOSIS — D500 Iron deficiency anemia secondary to blood loss (chronic): Secondary | ICD-10-CM

## 2020-12-27 DIAGNOSIS — E538 Deficiency of other specified B group vitamins: Secondary | ICD-10-CM

## 2020-12-27 DIAGNOSIS — Z79899 Other long term (current) drug therapy: Secondary | ICD-10-CM | POA: Diagnosis not present

## 2020-12-27 DIAGNOSIS — F410 Panic disorder [episodic paroxysmal anxiety] without agoraphobia: Secondary | ICD-10-CM

## 2020-12-27 LAB — CBC WITH DIFFERENTIAL (CANCER CENTER ONLY)
Abs Immature Granulocytes: 0.01 10*3/uL (ref 0.00–0.07)
Basophils Absolute: 0 10*3/uL (ref 0.0–0.1)
Basophils Relative: 0 %
Eosinophils Absolute: 0.2 10*3/uL (ref 0.0–0.5)
Eosinophils Relative: 3 %
HCT: 39.5 % (ref 36.0–46.0)
Hemoglobin: 12.9 g/dL (ref 12.0–15.0)
Immature Granulocytes: 0 %
Lymphocytes Relative: 34 %
Lymphs Abs: 2.4 10*3/uL (ref 0.7–4.0)
MCH: 26.9 pg (ref 26.0–34.0)
MCHC: 32.7 g/dL (ref 30.0–36.0)
MCV: 82.5 fL (ref 80.0–100.0)
Monocytes Absolute: 0.5 10*3/uL (ref 0.1–1.0)
Monocytes Relative: 7 %
Neutro Abs: 3.8 10*3/uL (ref 1.7–7.7)
Neutrophils Relative %: 56 %
Platelet Count: 274 10*3/uL (ref 150–400)
RBC: 4.79 MIL/uL (ref 3.87–5.11)
RDW: 19.8 % — ABNORMAL HIGH (ref 11.5–15.5)
WBC Count: 7 10*3/uL (ref 4.0–10.5)
nRBC: 0 % (ref 0.0–0.2)

## 2020-12-27 LAB — CMP (CANCER CENTER ONLY)
ALT: 12 U/L (ref 0–44)
AST: 13 U/L — ABNORMAL LOW (ref 15–41)
Albumin: 3.8 g/dL (ref 3.5–5.0)
Alkaline Phosphatase: 63 U/L (ref 38–126)
Anion gap: 8 (ref 5–15)
BUN: 10 mg/dL (ref 6–20)
CO2: 24 mmol/L (ref 22–32)
Calcium: 9.1 mg/dL (ref 8.9–10.3)
Chloride: 108 mmol/L (ref 98–111)
Creatinine: 0.82 mg/dL (ref 0.44–1.00)
GFR, Estimated: >60 mL/min (ref >60)
Glucose, Bld: 89 mg/dL (ref 70–99)
Potassium: 3.9 mmol/L (ref 3.5–5.1)
Sodium: 140 mmol/L (ref 135–145)
Total Bilirubin: 0.5 mg/dL (ref 0.3–1.2)
Total Protein: 7.5 g/dL (ref 6.5–8.1)

## 2020-12-27 LAB — SAMPLE TO BLOOD BANK

## 2020-12-27 LAB — FERRITIN: Ferritin: 63 ng/mL (ref 11–307)

## 2020-12-27 LAB — IRON AND TIBC
Iron: 76 ug/dL (ref 41–142)
Saturation Ratios: 31 % (ref 21–57)
TIBC: 245 ug/dL (ref 236–444)
UIBC: 169 ug/dL (ref 120–384)

## 2020-12-27 MED ORDER — FLUOXETINE HCL 10 MG PO TABS: 10.0000 mg | ORAL_TABLET | Freq: Every day | ORAL | 1 refills | Status: DC

## 2020-12-27 MED ORDER — HYDROXYZINE HCL 50 MG PO TABS: 50.0000 mg | ORAL_TABLET | Freq: Three times a day (TID) | ORAL | 0 refills | Status: DC | PRN

## 2020-12-27 MED ORDER — ALPRAZOLAM 0.5 MG PO TBDP: 0.5000 mg | ORAL_TABLET | Freq: Two times a day (BID) | ORAL | 0 refills | Status: DC | PRN

## 2020-12-27 NOTE — Patient Instructions (Signed)
° ° ° °  If you have lab work done today you will be contacted with your lab results within the next 2 weeks.  If you have not heard from us then please contact us. The fastest way to get your results is to register for My Chart. ° ° °IF you received an x-ray today, you will receive an invoice from [facility]. Please contact [facility] at [phone] with questions or concerns regarding your invoice.  ° °IF you received labwork today, you will receive an invoice from [facility]. Please contact [facility] at [phone] with questions or concerns regarding your invoice.  ° °Our billing staff will not be able to assist you with questions regarding bills from these companies. ° °You will be contacted with the lab results as soon as they are available. The fastest way to get your results is to activate your My Chart account. Instructions are located on the last page of this paperwork. If you have not heard from us regarding the results in 2 weeks, please contact this office. °  ° ° ° °

## 2020-12-27 NOTE — Progress Notes (Signed)
Telemedicine Encounter- SOAP NOTE Established Patient  This video encounter was conducted with the patient's (or proxy's) verbal consent via audio telecommunications: yes/no: Yes Patient was instructed to have this encounter in a suitably private space; and to only have persons present to whom they give permission to participate. In addition, patient identity was confirmed by use of name plus two identifiers (DOB and address).  I discussed the limitations, risks, security and privacy concerns of performing an evaluation and management service by telephone and the availability of in person appointments. I also discussed with the patient that there may be a patient responsible charge related to this service. The patient expressed understanding and agreed to proceed.  I spent a total of 16 minutes talking with the patient or their proxy.  Patient at home Provider in office  Participants: Kathrin Ruddy, NP and [name]  Chief Complaint  Patient presents with   Follow-up    Patient states she needs to discuss medication refills and pt would like to discuss her having a panic attack.    Subjective   Katie Powell is a 30 y.o. established patient. Video visit today for follow up  HPI Anxiety -  Had been using hydroxyzine '50mg'$  Po qd prn for anxiety In general had been ok for a while   Finds that she wakes often at night with gasping for air, having trouble getting air  Does not snore when sleeping, no witnessed apnea, and no waking with headache or dry mouth. Reports sleep is generally restorative - working two jobs  A lot of acute anxiety - particularly about upcoming travel to [place] for her sister's birthday. 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 Use in each nostril as directed 30 mL 12   desogestrel-ethinyl estradiol (APRI) 0.15-30 MG-MCG tablet Take 1 tablet by mouth daily. 28 tablet 11   hydrOXYzine (ATARAX/VISTARIL) 50 MG tablet Take 1 tablet (50 mg total) by mouth 3 (three) times daily as needed. 60 tablet 0   vitamin B-12 (CYANOCOBALAMIN) 1000 MCG tablet Take 1 tablet (1,000 mcg total) by mouth daily. 30 tablet 3   acetaminophen (TYLENOL) 500 MG tablet Take 1 tablet (500 mg total) by mouth every 6 (six) hours as needed. (Patient not taking: No sig reported) 30 tablet 0   aluminum-magnesium hydroxide 200-200 MG/5ML suspension Take 15 mLs by mouth 3 (three) times daily with  meals as needed for indigestion. (Patient not taking: No sig reported) 355 mL 0   ferrous sulfate 325 (65 FE) MG tablet Take 1 tablet (325 mg total) by mouth 2 (two) times daily with a meal. (Patient not taking: Reported on 12/27/2020) 180 tablet 0   fluticasone (FLONASE) 50 MCG/ACT nasal spray Place 1 spray into both nostrils daily. (Patient not taking: No sig reported) 11.1 mL 2   oseltamivir  (TAMIFLU) 75 MG capsule Take 1 capsule (75 mg total) by mouth every 12 (twelve) hours. (Patient not taking: No sig reported) 10 capsule 0   pantoprazole (PROTONIX) 40 MG tablet Take 1 tablet (40 mg total) by mouth 2 (two) times daily. (Patient not taking: No sig reported) 90 tablet 2   pantoprazole (PROTONIX) 40 MG tablet Take 1 tablet (40 mg total) by mouth 2 (two) times daily. (Patient not taking: No sig reported) 90 tablet 3   sucralfate (CARAFATE) 1 g tablet Take 1 tablet (1 g total) by mouth 4 (four) times daily as needed. (Patient not taking: No sig reported) 56 tablet 0   sucralfate (CARAFATE) 1 GM/10ML suspension Take 10 mLs (1 g total) by mouth 4 (four) times daily -  with meals and at bedtime. (Patient not taking: No sig reported) 840 mL 0   Current Facility-Administered Medications  Medication Dose Route Frequency Provider Last Rate Last Admin   0.9 %  sodium chloride infusion  500 mL Intravenous Once Nandigam, Venia Minks, MD        No Known Allergies  Social History   Socioeconomic History   Marital status: Single    Spouse name: Not on file   Number of children: Not on file   Years of education: Not on file   Highest education level: Not on file  Occupational History   Not on file  Tobacco Use   Smoking status: Every Day    Packs/day: 0.50    Types: Cigarettes   Smokeless tobacco: Never  Vaping Use   Vaping Use: Never used  Substance and Sexual Activity   Alcohol use: Not Currently   Drug use: No   Sexual activity: Yes    Birth control/protection: Injection, None  Other Topics Concern   Not on file  Social History Narrative   Not on file   Social Determinants of Health   Financial Resource Strain: Not on file  Food Insecurity: Not on file  Transportation Needs: Not on file  Physical Activity: Not on file  Stress: Not on file  Social Connections: Not on file  Intimate Partner Violence: Not on file    Review of Systems  Constitutional:  Positive for  malaise/fatigue. Negative for chills, diaphoresis, fever and weight loss.  HENT: Negative.    Eyes: Negative.   Respiratory: Negative.    Cardiovascular: Negative.   Gastrointestinal: Negative.   Genitourinary: Negative.   Musculoskeletal: Negative.   Skin: Negative.   Neurological: Negative.   Endo/Heme/Allergies: Negative.   Psychiatric/Behavioral:  The patient is nervous/anxious.   All other systems reviewed and are negative.  Objective   Vitals as reported by the patient: There were no vitals filed for this visit.  There are no diagnoses linked to this encounter.  PLAN Start fluoxetine '10mg'$  po qd. Increase to '20mg'$  daily after 2 weeks if tolerating well Sending refill on hydroxyzine  Sending alprazolam sublingual, has worked well in past.  Return in 4-6 weeks for med check Patient encouraged to call clinic with any questions, comments, or concerns.  I discussed the assessment and treatment plan with the patient. The patient was provided an opportunity to ask questions and all were answered. The patient agreed with the plan and demonstrated an understanding of the instructions.   The patient was advised to call back or seek an in-person evaluation if the symptoms worsen or if the condition fails to improve as anticipated.  I provided 22 minutes of non-face-to-face time during this encounter.  [name], NP  Primary Care at [facility]

## 2020-12-30 ENCOUNTER — Telehealth: Payer: Self-pay | Admitting: Hematology

## 2020-12-30 NOTE — Telephone Encounter (Signed)
Left message with follow-up appointment per 8/19 los.

## 2021-01-03 ENCOUNTER — Encounter: Payer: Self-pay | Admitting: Physician Assistant

## 2021-01-03 NOTE — Progress Notes (Signed)
HEMATOLOGY/ONCOLOGY CLINIC VISIT:   Date: .12/27/2020   Patient Care Team: [name], NP as PCP - General (Adult Health Nurse Practitioner)  CHIEF COMPLAINT: Iron deficiency anemia  INTERVAL HISTORY:  Katie Powell is a 30 y.o. female here for a follow up for iron deficiency anemia. Patient was last seen in clinic by Katie Query PA-C on 10/23/2020.   Previously patient had received IV venofer for iron deficiency anemia during her pregnancy. Katie Powell had a vaginal delivery on 03/25/2019. After her delivery, patient notes that she did not take her iron tablets consistently or continued to monitor her iron levels.   On 10/08/2020, patient presented to the ED after a syncopal episode. Labs from 10/08/2020 revealed microcytic anemia with hemoglobin of 7.8, MCV 63.8.Labs from 10/09/2020 revealed iron deficiency anemia with iron of 14, iron saturation at 4% and ferritin of 3. Patient was started on ferrous sulfate 325 mg twice daily.   Patient was set up for and received 2 IV Feraheme infusions on 11/01/2020 and 11/08/2020.  She notes she tolerated these well without any overt side effects.  She notes that she is feeling much better and her energy levels are improved.  No significant fatigue lightheadedness or dizziness at this time.  Labs show improvement in her hemoglobin from 8.7 to 12.9.  MCV improved from 67.9 to 82.5. Ferritin has improved from 3 to 63 .  Iron saturation is now 31%. Patient notes ongoing heavy menstrual cycles. No evidence of GI bleeding or other blood loss.   MEDICAL HISTORY:  Past Medical History:  Diagnosis Date   Anemia    Anxiety    Blood transfusion without reported diagnosis    Establishing care with new doctor, encounter for 03/09/2019    SURGICAL HISTORY: Past Surgical History:  Procedure Laterality Date   EYE SURGERY     FINGER SURGERY      SOCIAL HISTORY: Social History   Socioeconomic History   Marital status: Single    Spouse  name: Not on file   Number of children: Not on file   Years of education: Not on file   Highest education level: Not on file  Occupational History   Not on file  Tobacco Use   Smoking status: Every Day    Packs/day: 0.50    Types: Cigarettes   Smokeless tobacco: Never  Vaping Use   Vaping Use: Never used  Substance and Sexual Activity   Alcohol use: Not Currently   Drug use: No   Sexual activity: Yes    Birth control/protection: Injection, None  Other Topics Concern   Not on file  Social History Narrative   Not on file   Social Determinants of Health   Financial Resource Strain: Not on file  Food Insecurity: Not on file  Transportation Needs: Not on file  Physical Activity: Not on file  Stress: Not on file  Social Connections: Not on file  Intimate Partner Violence: Not on file    FAMILY HISTORY: Family History  Adopted: Yes    ALLERGIES:  has No Known Allergies.  MEDICATIONS:  Current Outpatient Medications  Medication Sig Dispense Refill   acetaminophen (TYLENOL) 500 MG tablet Take 1 tablet (500 mg total) by mouth every 6 (six) hours as needed. 30 tablet 0   ALPRAZolam (NIRAVAM) 0.5 MG dissolvable tablet Take 1 tablet (0.5 mg total) by mouth 2 (two) times daily as needed for anxiety. 30 tablet 0   desogestrel-ethinyl estradiol (APRI) 0.15-30 MG-MCG tablet Take 1  tablet by mouth daily. 28 tablet 11   ferrous sulfate 325 (65 FE) MG tablet Take 1 tablet (325 mg total) by mouth 2 (two) times daily with a meal. 180 tablet 0   FLUoxetine (PROZAC) 10 MG tablet Take 1 tablet (10 mg total) by mouth daily. Increase to 2 tablets (20 mg) by mouth daily after 2 weeks. 90 tablet 1   vitamin B-12 (CYANOCOBALAMIN) 1000 MCG tablet Take 1 tablet (1,000 mcg total) by mouth daily. 30 tablet 3   aluminum-magnesium hydroxide 200-200 MG/5ML suspension Take 15 mLs by mouth 3 (three) times daily with meals as needed for indigestion. (Patient not taking: No sig reported) 355 mL 0    azelastine (ASTELIN) 0.1 % nasal spray Place 1 spray into both nostrils 2 (two) times daily. Use in each nostril as directed (Patient not taking: Reported on 12/27/2020) 30 mL 12   fluticasone (FLONASE) 50 MCG/ACT nasal spray Place 1 spray into both nostrils daily. (Patient not taking: No sig reported) 11.1 mL 2   hydrOXYzine (ATARAX/VISTARIL) 50 MG tablet Take 1 tablet (50 mg total) by mouth 3 (three) times daily as needed. (Patient not taking: Reported on 12/27/2020) 60 tablet 0   oseltamivir (TAMIFLU) 75 MG capsule Take 1 capsule (75 mg total) by mouth every 12 (twelve) hours. (Patient not taking: No sig reported) 10 capsule 0   pantoprazole (PROTONIX) 40 MG tablet Take 1 tablet (40 mg total) by mouth 2 (two) times daily. (Patient not taking: No sig reported) 90 tablet 2   pantoprazole (PROTONIX) 40 MG tablet Take 1 tablet (40 mg total) by mouth 2 (two) times daily. (Patient not taking: No sig reported) 90 tablet 3   sucralfate (CARAFATE) 1 g tablet Take 1 tablet (1 g total) by mouth 4 (four) times daily as needed. (Patient not taking: No sig reported) 56 tablet 0   sucralfate (CARAFATE) 1 GM/10ML suspension Take 10 mLs (1 g total) by mouth 4 (four) times daily -  with meals and at bedtime. 840 mL 0   Current Facility-Administered Medications  Medication Dose Route Frequency Provider Last Rate Last Admin   0.9 %  sodium chloride infusion  500 mL Intravenous Once Nandigam, Venia Minks, MD        REVIEW OF SYSTEMS:   .10 Point review of Systems was done is negative except as noted above.    PHYSICAL EXAMINATION:  Vitals:   12/27/20 1416  BP: 112/76  Pulse: 75  Resp: 16  Temp: 98.6 F (37 C)  SpO2: 100%   Wt Readings from Last 3 Encounters:  12/27/20 202 lb 12.8 oz (92 kg)  11/15/20 197 lb (89.4 kg)  10/09/20 195 lb 9.6 oz (88.7 kg)   Body mass index is 32.73 kg/m.    Exam was not performed since it was a telehealth visit.   LABORATORY DATA:  I have reviewed the data as  listed  . CBC Latest Ref Rng & Units 12/27/2020 10/24/2020 10/09/2020  WBC 4.0 - 10.5 K/uL 7.0 6.9 5.3  Hemoglobin 12.0 - 15.0 g/dL 12.9 8.7(L) 7.8 cL(LL)  Hematocrit 36.0 - 46.0 % 39.5 30.3(L) 25.8 aL(L)  Platelets 150 - 400 K/uL 274 400 321.0    . CMP Latest Ref Rng & Units 12/27/2020 10/09/2020 10/08/2020  Glucose 70 - 99 mg/dL 89 81 82  BUN 6 - 20 mg/dL '10 13 13  '$ Creatinine 0.44 - 1.00 mg/dL 0.82 0.73 0.74  Sodium 135 - 145 mmol/L 140 140 137  Potassium 3.5 - 5.1  mmol/L 3.9 4.4 3.7  Chloride 98 - 111 mmol/L 108 108 106  CO2 22 - 32 mmol/L 24 18(L) 22  Calcium 8.9 - 10.3 mg/dL 9.1 9.0 8.9  Total Protein 6.5 - 8.1 g/dL 7.5 7.6 -  Total Bilirubin 0.3 - 1.2 mg/dL 0.5 0.5 -  Alkaline Phos 38 - 126 U/L 63 66 -  AST 15 - 41 U/L 13(L) 14 -  ALT 0 - 44 U/L 12 8 -   . Lab Results  Component Value Date   IRON 76 12/27/2020   TIBC 245 12/27/2020   IRONPCTSAT 31 12/27/2020   (Iron and TIBC)  Lab Results  Component Value Date   FERRITIN 63 12/27/2020     ASSESSMENT & PLAN:  BREALE WILKENS is a 30 y.o. female presenting for a telehealth visit for iron deficiency anemia.   1. Iron Deficiency Anemia 2/2 menstrual bleeding: -Patient received IV feraheme and venofer during her pregnancy from 01/19/2019-02/03/2019. -Patient presented to ED on 10/08/2020 due to syncopal episode. Labs  from 10/08/2020 revealed microcytic anemia with hemoglobin of 7.8, MCV 63.8.Labs from 10/09/2020 revealed iron deficiency anemia with iron of 14, iron saturation at 4% and ferritin of 3. Patient was started on ferrous sulfate 325 mg twice daily.  PLAN -Patient tolerated IV Feraheme well her iron deficiency anemia is currently resolved. -Labs show improvement in her hemoglobin from 8.7 to 12.9.  MCV improved from 67.9 to 82.5. Ferritin has improved from 3 to 63 .  Iron saturation is now 31%. -Currently on ferrous sulfate 325 mg PO twice daily. Reviewed that if patient develops any GI intolerance, it is okay to take  once daily. Recommend to take oral iron tablets with a glass of orange juice to help improve absorption.  -Reviewed foods that are iron rich to incorporate into diet including liver and red meat.  -Patient will follow with her OB/GYN to discuss interventions to help improve menstrual bleeding.   2. Borderline B12 deficiency: -Labs from 10/09/2020 shows borderline B12 deficiency.  -Recommend to start oral B12 1000 mcg tablet once daily.   Follow-up RTC with Dr Irene Limbo with labs in 6 months   All of the patient's questions were answered with apparent satisfaction. The patient knows to call the clinic with any problems, questions or concerns.  . The total time spent in the appointment was 20 minutes and more than 50% was on counseling and direct patient cares.   Sullivan Lone MD MS Hematology/Oncology Physician [facility]

## 2021-01-05 ENCOUNTER — Encounter: Payer: Self-pay | Admitting: Physician Assistant

## 2021-02-19 ENCOUNTER — Emergency Department (HOSPITAL_COMMUNITY): Payer: Medicaid Other

## 2021-02-19 ENCOUNTER — Encounter (HOSPITAL_COMMUNITY): Payer: Self-pay | Admitting: Emergency Medicine

## 2021-02-19 ENCOUNTER — Emergency Department (HOSPITAL_COMMUNITY)
Admission: EM | Admit: 2021-02-19 | Discharge: 2021-02-19 | Disposition: A | Discharge status: Home / Self Care | Payer: Medicaid Other | Attending: Emergency Medicine | Admitting: Emergency Medicine

## 2021-02-19 ENCOUNTER — Other Ambulatory Visit: Payer: Self-pay

## 2021-02-19 DIAGNOSIS — S0990XA Unspecified injury of head, initial encounter: Secondary | ICD-10-CM | POA: Insufficient documentation

## 2021-02-19 DIAGNOSIS — Y92410 Unspecified street and highway as the place of occurrence of the external cause: Secondary | ICD-10-CM | POA: Diagnosis not present

## 2021-02-19 DIAGNOSIS — H43399 Other vitreous opacities, unspecified eye: Secondary | ICD-10-CM | POA: Diagnosis not present

## 2021-02-19 DIAGNOSIS — R519 Headache, unspecified: Secondary | ICD-10-CM

## 2021-02-19 DIAGNOSIS — F17210 Nicotine dependence, cigarettes, uncomplicated: Secondary | ICD-10-CM | POA: Diagnosis not present

## 2021-02-19 LAB — PREGNANCY, URINE: Preg Test, Ur: NEGATIVE

## 2021-02-19 MED ORDER — ACETAMINOPHEN 500 MG PO TABS
500.0000 mg | ORAL_TABLET | Freq: Once | ORAL | Status: AC
  Administered 2021-02-19: 500 mg via ORAL
  Filled 2021-02-19: qty 1

## 2021-02-19 NOTE — Discharge Instructions (Signed)
Take Tylenol, or anti-inflammatory such as Motrin or naproxen for pain control.  Recommend rest and taking it easy over the next day or so.  If you develop worsening pain, vomiting, difficulty breathing or other new concerning symptom, come back to ER for reassessment.
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
## 2021-03-03 NOTE — Telephone Encounter (Signed)
This concern has been previously addressed by myself and/or another provider.  If they patient has ongoing concerns, they can contact me at their convenience.  Thank you,  Rich Sebert Stollings, NP 

## 2021-03-03 NOTE — Telephone Encounter (Signed)
Have given pt letter.  Thank you  Rich

## 2021-03-04 ENCOUNTER — Encounter: Payer: Self-pay | Admitting: Physician Assistant

## 2021-06-20 ENCOUNTER — Encounter: Payer: Self-pay | Admitting: Physician Assistant

## 2021-06-26 ENCOUNTER — Other Ambulatory Visit: Payer: Self-pay

## 2021-06-26 DIAGNOSIS — D500 Iron deficiency anemia secondary to blood loss (chronic): Secondary | ICD-10-CM

## 2021-06-27 ENCOUNTER — Inpatient Hospital Stay: Payer: Medicaid Other

## 2021-06-27 ENCOUNTER — Inpatient Hospital Stay: Payer: Medicaid Other | Attending: Hematology | Admitting: Hematology

## 2021-07-03 ENCOUNTER — Encounter: Payer: Self-pay | Admitting: Registered Nurse

## 2021-07-03 ENCOUNTER — Ambulatory Visit (INDEPENDENT_AMBULATORY_CARE_PROVIDER_SITE_OTHER): Payer: Medicaid Other | Admitting: Registered Nurse

## 2021-07-03 ENCOUNTER — Other Ambulatory Visit: Payer: Self-pay

## 2021-07-03 ENCOUNTER — Other Ambulatory Visit (HOSPITAL_COMMUNITY)
Admission: RE | Admit: 2021-07-03 | Discharge: 2021-07-03 | Disposition: A | Discharge status: Home / Self Care | Payer: Medicaid Other | Source: Ambulatory Visit | Attending: Family Medicine | Admitting: Family Medicine

## 2021-07-03 VITALS — BP 113/74 | HR 83 | Temp 98.0°F | Resp 18 | Ht 66.0 in | Wt 201.4 lb

## 2021-07-03 DIAGNOSIS — R350 Frequency of micturition: Secondary | ICD-10-CM

## 2021-07-03 DIAGNOSIS — N926 Irregular menstruation, unspecified: Secondary | ICD-10-CM

## 2021-07-03 LAB — POCT URINALYSIS DIP (MANUAL ENTRY)
Blood, UA: NEGATIVE
Glucose, UA: NEGATIVE mg/dL
Nitrite, UA: NEGATIVE
Spec Grav, UA: 1.02 (ref 1.010–1.025)
Urobilinogen, UA: 0.2 E.U./dL
pH, UA: 6.5 (ref 5.0–8.0)

## 2021-07-03 LAB — POCT URINE PREGNANCY: Preg Test, Ur: NEGATIVE

## 2021-07-03 NOTE — Progress Notes (Signed)
Established Patient Office Visit  Subjective:  Patient ID: Katie Powell, female    DOB: [birth year]  Age: 31 y.o. MRN: [id number]  CC:  Chief Complaint  Patient presents with   Vaginal Bleeding    Patient states she has only been spotting , feeling fatigue and has been has not been using protection lately. Patient also feels she has been urinating frequently and may have UTI    HPI Katie Powell presents for concern for pregnancy, UTI.  Vaginal spotting, fatigue, upic lately. No missed menses. Concern for frequent urination.  No vaginal symptoms otherwise No pelvic pain or constitutional symptoms.  Past Medical History:  Diagnosis Date   Anemia    Anxiety    Blood transfusion without reported diagnosis    Establishing care with new doctor, encounter for 03/09/2019    Past Surgical History:  Procedure Laterality Date   EYE SURGERY     FINGER SURGERY      Family History  Adopted: Yes    Social History   Socioeconomic History   Marital status: Single    Spouse name: Not on file   Number of children: Not on file   Years of education: Not on file   Highest education level: Not on file  Occupational History   Not on file  Tobacco Use   Smoking status: Every Day    Packs/day: 0.50    Types: Cigarettes   Smokeless tobacco: Never  Vaping Use   Vaping Use: Never used  Substance and Sexual Activity   Alcohol use: Not Currently   Drug use: No   Sexual activity: Yes    Birth control/protection: Injection, None  Other Topics Concern   Not on file  Social History Narrative   Not on file   Social Determinants of Health   Financial Resource Strain: Not on file  Food Insecurity: Not on file  Transportation Needs: Not on file  Physical Activity: Not on file  Stress: Not on file  Social Connections: Not on file  Intimate Partner Violence: Not on file    Outpatient Medications Prior to Visit  Medication Sig Dispense Refill   acetaminophen (TYLENOL) 500  MG tablet Take 1 tablet (500 mg total) by mouth every 6 (six) hours as needed. 30 tablet 0   ALPRAZolam (NIRAVAM) 0.5 MG dissolvable tablet Take 1 tablet (0.5 mg total) by mouth 2 (two) times daily as needed for anxiety. 30 tablet 0   desogestrel-ethinyl estradiol (APRI) 0.15-30 MG-MCG tablet Take 1 tablet by mouth daily. 28 tablet 11   ferrous sulfate 325 (65 FE) MG tablet Take 1 tablet (325 mg total) by mouth 2 (two) times daily with a meal. 180 tablet 0   FLUoxetine (PROZAC) 10 MG tablet Take 1 tablet (10 mg total) by mouth daily. Increase to 2 tablets (20 mg) by mouth daily after 2 weeks. 90 tablet 1   sucralfate (CARAFATE) 1 GM/10ML suspension Take 10 mLs (1 g total) by mouth 4 (four) times daily -  with meals and at bedtime. 840 mL 0   vitamin B-12 (CYANOCOBALAMIN) 1000 MCG tablet Take 1 tablet (1,000 mcg total) by mouth daily. 30 tablet 3   aluminum-magnesium hydroxide 200-200 MG/5ML suspension Take 15 mLs by mouth 3 (three) times daily with meals as needed for indigestion. (Patient not taking: Reported on 10/08/2020) 355 mL 0   azelastine (ASTELIN) 0.1 % nasal spray Place 1 spray into both nostrils 2 (two) times daily. Use in each nostril as directed (Patient  not taking: Reported on 12/27/2020) 30 mL 12   fluticasone (FLONASE) 50 MCG/ACT nasal spray Place 1 spray into both nostrils daily. (Patient not taking: Reported on 10/08/2020) 11.1 mL 2   hydrOXYzine (ATARAX/VISTARIL) 50 MG tablet Take 1 tablet (50 mg total) by mouth 3 (three) times daily as needed. (Patient not taking: Reported on 12/27/2020) 60 tablet 0   oseltamivir (TAMIFLU) 75 MG capsule Take 1 capsule (75 mg total) by mouth every 12 (twelve) hours. (Patient not taking: Reported on 10/08/2020) 10 capsule 0   pantoprazole (PROTONIX) 40 MG tablet Take 1 tablet (40 mg total) by mouth 2 (two) times daily. (Patient not taking: Reported on 10/08/2020) 90 tablet 2   pantoprazole (PROTONIX) 40 MG tablet Take 1 tablet (40 mg total) by mouth 2 (two)  times daily. (Patient not taking: Reported on 10/08/2020) 90 tablet 3   sucralfate (CARAFATE) 1 g tablet Take 1 tablet (1 g total) by mouth 4 (four) times daily as needed. (Patient not taking: Reported on 10/08/2020) 56 tablet 0   Facility-Administered Medications Prior to Visit  Medication Dose Route Frequency Provider Last Rate Last Admin   0.9 %  sodium chloride infusion  500 mL Intravenous Once Nandigam, Venia Minks, MD        No Known Allergies  ROS Review of Systems Per hpi     Objective:    Physical Exam Vitals and nursing note reviewed.  Constitutional:      General: She is not in acute distress.    Appearance: Normal appearance. She is normal weight. She is not ill-appearing, toxic-appearing or diaphoretic.  Cardiovascular:     Rate and Rhythm: Normal rate and regular rhythm.     Heart sounds: Normal heart sounds. No murmur heard.   No friction rub. No gallop.  Pulmonary:     Effort: Pulmonary effort is normal. No respiratory distress.     Breath sounds: Normal breath sounds. No stridor. No wheezing, rhonchi or rales.  Chest:     Chest wall: No tenderness.  Skin:    General: Skin is warm and dry.  Neurological:     General: No focal deficit present.     Mental Status: She is alert and oriented to person, place, and time. Mental status is at baseline.  Psychiatric:        Mood and Affect: Mood normal.        Behavior: Behavior normal.        Thought Content: Thought content normal.        Judgment: Judgment normal.    BP 113/74    Pulse 83    Temp 98 F (36.7 C) (Temporal)    Resp 18    Ht 5\' 6"  (1.676 m)    Wt 201 lb 6.4 oz (91.4 kg)    SpO2 99%    BMI 32.51 kg/m  Wt Readings from Last 3 Encounters:  07/03/21 201 lb 6.4 oz (91.4 kg)  12/27/20 202 lb 12.8 oz (92 kg)  11/15/20 197 lb (89.4 kg)     There are no preventive care reminders to display for this patient.  There are no preventive care reminders to display for this patient.  Lab Results  Component  Value Date   TSH 0.977 06/16/2019   Lab Results  Component Value Date   WBC 7.0 12/27/2020   HGB 12.9 12/27/2020   HCT 39.5 12/27/2020   MCV 82.5 12/27/2020   PLT 274 12/27/2020   Lab Results  Component Value Date  NA 140 12/27/2020   K 3.9 12/27/2020   CO2 24 12/27/2020   GLUCOSE 89 12/27/2020   BUN 10 12/27/2020   CREATININE 0.82 12/27/2020   BILITOT 0.5 12/27/2020   ALKPHOS 63 12/27/2020   AST 13 (L) 12/27/2020   ALT 12 12/27/2020   PROT 7.5 12/27/2020   ALBUMIN 3.8 12/27/2020   CALCIUM 9.1 12/27/2020   ANIONGAP 8 12/27/2020   GFR 112.69 10/09/2020   No results found for: CHOL No results found for: HDL No results found for: LDLCALC No results found for: TRIG No results found for: CHOLHDL No results found for: HGBA1C    Assessment & Plan:   Problem List Items Addressed This Visit   None Visit Diagnoses     Frequent urination    -  Primary   Relevant Orders   POCT urinalysis dipstick (Completed)   Urine Culture   Irregular menses       Relevant Orders   POCT urine pregnancy (Completed)   Urine cytology ancillary only(Ryan)   Urine Culture       No orders of the defined types were placed in this encounter.   Follow-up: Return if symptoms worsen or fail to improve.   PLAN Poct UA negative Labs as above. Treat as indicated Patient encouraged to call clinic with any questions, comments, or concerns.  [name], NP

## 2021-07-03 NOTE — Patient Instructions (Addendum)
° °  Ms. Katie Powell to see you  Negative pregnancy test, no acute signs of UTI. Will send out labs and let you know how they look  Call if things get worse or don't improve  Thanks,  Rich  If you have lab work done today you will be contacted with your lab results within the next 2 weeks.  If you have not heard from [place] then please contact us. The fastest way to get your results is to register for My Chart.   IF you received an x-ray today, you will receive an invoice from [facility]. Please contact [facility] at [phone] with questions or concerns regarding your invoice.   IF you received labwork today, you will receive an invoice from [place]. Please contact [facility] at [phone] with questions or concerns regarding your invoice.   Our billing staff will not be able to assist you with questions regarding bills from these companies.  You will be contacted with the lab results as soon as they are available. The fastest way to get your results is to activate your My Chart account. Instructions are located on the last page of this paperwork. If you have not heard from [place] regarding the results in 2 weeks, please contact this office.

## 2021-07-08 LAB — URINE CYTOLOGY ANCILLARY ONLY
Bacterial Vaginitis-Urine: NEGATIVE
Candida Urine: NEGATIVE
Chlamydia: NEGATIVE
Comment: NEGATIVE
Comment: NEGATIVE
Comment: NORMAL
Neisseria Gonorrhea: NEGATIVE
Trichomonas: NEGATIVE

## 2021-08-12 ENCOUNTER — Emergency Department (HOSPITAL_COMMUNITY)
Admission: EM | Admit: 2021-08-12 | Discharge: 2021-08-12 | Disposition: A | Discharge status: Home / Self Care | Payer: Medicaid Other | Attending: Emergency Medicine | Admitting: Emergency Medicine

## 2021-08-12 ENCOUNTER — Encounter (HOSPITAL_COMMUNITY): Payer: Self-pay | Admitting: Emergency Medicine

## 2021-08-12 DIAGNOSIS — R5383 Other fatigue: Secondary | ICD-10-CM | POA: Insufficient documentation

## 2021-08-12 DIAGNOSIS — Z20822 Contact with and (suspected) exposure to covid-19: Secondary | ICD-10-CM | POA: Insufficient documentation

## 2021-08-12 LAB — RESP PANEL BY RT-PCR (FLU A&B, COVID) ARPGX2
Influenza A by PCR: NEGATIVE
Influenza B by PCR: NEGATIVE
SARS Coronavirus 2 by RT PCR: NEGATIVE

## 2021-08-12 LAB — POC URINE PREG, ED: Preg Test, Ur: NEGATIVE

## 2021-08-12 MED ORDER — ONDANSETRON 4 MG PO TBDP
4.0000 mg | ORAL_TABLET | Freq: Once | ORAL | Status: AC
  Administered 2021-08-12: 4 mg via ORAL
  Filled 2021-08-12: qty 1

## 2021-08-12 NOTE — ED Triage Notes (Signed)
Per patient, states she does not know if she needs a covid test or a pregnancy test-states she has been having hot flashes and nausea for 4 days-states her LMP was 2/27-states she took several pregnancy tests but they were negative ?

## 2021-08-12 NOTE — ED Provider Notes (Signed)
?[facility] DEPT ?Provider Note ? ? ?CSN: [id number] ?Arrival date & time: 08/12/21  1203 ? ?  ? ?History ?Chief Complaint  ?Patient presents with  ? hot flashes/nausea  ? ? ?RHIANN BOUCHER is a 31 y.o. female who presents to the emergency department with a 5-day history of hot flashes, nausea, and abdominal cramping.  She is also been increasingly fatigued.  No weight gain or dry skin.  No vomiting or diarrhea.  Patient states she is late on her menstrual cycle by approximately a week and she is normally regular.  Currently does not have an OB/GYN. ? ?HPI ? ?  ? ?Home Medications ?Prior to Admission medications   ?Medication Sig Start Date End Date Taking? Authorizing Provider  ?acetaminophen (TYLENOL) 500 MG tablet Take 1 tablet (500 mg total) by mouth every 6 (six) hours as needed. 04/25/20   Domenic Moras, PA-C  ?ALPRAZolam (NIRAVAM) 0.5 MG dissolvable tablet Take 1 tablet (0.5 mg total) by mouth 2 (two) times daily as needed for anxiety. 12/27/20   [name], NP  ?aluminum-magnesium hydroxide 200-200 MG/5ML suspension Take 15 mLs by mouth 3 (three) times daily with meals as needed for indigestion. ?Patient not taking: Reported on 10/08/2020 01/29/20   Loura Halt A, NP  ?azelastine (ASTELIN) 0.1 % nasal spray Place 1 spray into both nostrils 2 (two) times daily. Use in each nostril as directed ?Patient not taking: Reported on 12/27/2020 11/15/20   [name], NP  ?desogestrel-ethinyl estradiol (APRI) 0.15-30 MG-MCG tablet Take 1 tablet by mouth daily. 11/15/20   [name], NP  ?ferrous sulfate 325 (65 FE) MG tablet Take 1 tablet (325 mg total) by mouth 2 (two) times daily with a meal. 10/08/20   [name], NP  ?FLUoxetine (PROZAC) 10 MG tablet Take 1 tablet (10 mg total) by mouth daily. Increase to 2 tablets (20 mg) by mouth daily after 2 weeks. 12/27/20   [name], NP  ?fluticasone (FLONASE) 50 MCG/ACT nasal spray Place 1 spray into both nostrils daily. ?Patient  not taking: Reported on 10/08/2020 09/29/19   Darr, Edison Nasuti, PA-C  ?hydrOXYzine (ATARAX/VISTARIL) 50 MG tablet Take 1 tablet (50 mg total) by mouth 3 (three) times daily as needed. ?Patient not taking: Reported on 12/27/2020 12/27/20   [name], NP  ?oseltamivir (TAMIFLU) 75 MG capsule Take 1 capsule (75 mg total) by mouth every 12 (twelve) hours. ?Patient not taking: Reported on 10/08/2020 04/25/20   Domenic Moras, PA-C  ?pantoprazole (PROTONIX) 40 MG tablet Take 1 tablet (40 mg total) by mouth 2 (two) times daily. ?Patient not taking: Reported on 10/08/2020 03/25/20   Mauri Pole, MD  ?pantoprazole (PROTONIX) 40 MG tablet Take 1 tablet (40 mg total) by mouth 2 (two) times daily. ?Patient not taking: Reported on 10/08/2020 04/01/20   Mauri Pole, MD  ?sucralfate (CARAFATE) 1 g tablet Take 1 tablet (1 g total) by mouth 4 (four) times daily as needed. ?Patient not taking: Reported on 10/08/2020 04/01/20   Mauri Pole, MD  ?sucralfate (CARAFATE) 1 GM/10ML suspension Take 10 mLs (1 g total) by mouth 4 (four) times daily -  with meals and at bedtime. 03/25/20   Mauri Pole, MD  ?vitamin B-12 (CYANOCOBALAMIN) 1000 MCG tablet Take 1 tablet (1,000 mcg total) by mouth daily. 10/21/20   Lincoln Brigham, PA-C  ?esomeprazole (NEXIUM) 40 MG capsule Take 1 capsule (40 mg total) by mouth daily. 01/09/20 01/29/20  [name], NP  ?   ? ?Allergies    ?  Patient has no known allergies.   ? ?Review of Systems   ?Review of Systems  ?All other systems reviewed and are negative. ? ?Physical Exam ?Updated Vital Signs ?BP 128/82 (BP Location: Left Arm)   Pulse 75   Temp 98.8 ?F (37.1 ?C) (Oral)   Resp 18   SpO2 99%  ?Physical Exam ?Vitals and nursing note reviewed.  ?Constitutional:   ?   Appearance: Normal appearance.  ?HENT:  ?   Head: Normocephalic and atraumatic.  ?Eyes:  ?   General:     ?   Right eye: No discharge.     ?   Left eye: No discharge.  ?   Conjunctiva/sclera: Conjunctivae normal.   ?Pulmonary:  ?   Effort: Pulmonary effort is normal.  ?Skin: ?   General: Skin is warm and dry.  ?   Findings: No rash.  ?Neurological:  ?   General: No focal deficit present.  ?   Mental Status: She is alert.  ?Psychiatric:     ?   Mood and Affect: Mood normal.     ?   Behavior: Behavior normal.  ? ? ?ED Results / Procedures / Treatments   ?Labs ?(all labs ordered are listed, but only abnormal results are displayed) ?Labs Reviewed  ?RESP PANEL BY RT-PCR (FLU A&B, COVID) ARPGX2  ?POC URINE PREG, ED  ? ? ?EKG ?None ? ?Radiology ?No results found. ? ?Procedures ?Procedures  ? ? ?Medications Ordered in ED ?Medications  ?ondansetron (ZOFRAN-ODT) disintegrating tablet 4 mg (4 mg Oral Given 08/12/21 1519)  ? ? ?ED Course/ Medical Decision Making/ A&P ?  ?                        ?Medical Decision Making ?Risk ?Prescription drug management. ? ? ?LANETRA HARTLEY is a 31 y.o. female who presents to the emergency department for hot flashes and fatigue for the last 5 days.  Differential diagnosis does include early conception as she was late on her menstrual cycle, viral illness, COVID, flu.  I have a low suspicion for hypothyroidism at this time.  I personally ordered and interpreted labs including pregnancy test which was negative.  And COVID and flu which is still pending.  Patient would like to review these results on MyChart as she has to be at work by 5.  This is totally amenable to plan.  We will have her follow-up with her primary care provider.  Strict return precautions given.  She is safe for discharge. ? ?Final Clinical Impression(s) / ED Diagnoses ?Final diagnoses:  ?Other fatigue  ? ? ?Rx / DC Orders ?ED Discharge Orders   ? ? None  ? ?  ? ? ?  ?Hendricks Limes, PA-C ?08/12/21 1533 ? ?  ?Carmin Muskrat, MD ?08/12/21 1654 ? ?

## 2021-08-12 NOTE — ED Notes (Signed)
I provided reinforced discharge education based off of discharge instructions. Pt acknowledged and understood my education. Pt had no further questions/concerns for provider/myself.  °

## 2021-08-12 NOTE — Discharge Instructions (Signed)
Please look out for results on MyChart for your COVID and flu.  Follow-up with your primary care provider for further evaluation.  Return to the emergency department for any worsening symptoms. ?

## 2021-08-13 ENCOUNTER — Other Ambulatory Visit: Payer: Self-pay | Admitting: Registered Nurse

## 2021-08-13 DIAGNOSIS — F410 Panic disorder [episodic paroxysmal anxiety] without agoraphobia: Secondary | ICD-10-CM

## 2021-08-13 MED ORDER — HYDROXYZINE HCL 50 MG PO TABS: 50.0000 mg | ORAL_TABLET | Freq: Three times a day (TID) | ORAL | 0 refills | Status: DC | PRN

## 2021-08-13 NOTE — Telephone Encounter (Signed)
Patient is requesting a refill of the following medications: ?Requested Prescriptions  ? ?Pending Prescriptions Disp Refills  ? hydrOXYzine (ATARAX) 50 MG tablet 60 tablet 0  ?  Sig: Take 1 tablet (50 mg total) by mouth 3 (three) times daily as needed.  ? ? ?Date of patient request: 08/13/2021 ?Last office visit: 07/03/2021 ?Date of last refill: 12/27/2020 ?Last refill amount: 60 tablets  ?Follow up time period per chart:   ?

## 2021-08-18 ENCOUNTER — Ambulatory Visit (HOSPITAL_COMMUNITY)
Admission: EM | Admit: 2021-08-18 | Discharge: 2021-08-18 | Disposition: A | Discharge status: Home / Self Care | Payer: Medicaid Other | Attending: Emergency Medicine | Admitting: Emergency Medicine

## 2021-08-18 ENCOUNTER — Encounter (HOSPITAL_COMMUNITY): Payer: Self-pay | Admitting: Emergency Medicine

## 2021-08-18 DIAGNOSIS — K047 Periapical abscess without sinus: Secondary | ICD-10-CM | POA: Diagnosis not present

## 2021-08-18 MED ORDER — AMOXICILLIN-POT CLAVULANATE 875-125 MG PO TABS
1.0000 | ORAL_TABLET | Freq: Two times a day (BID) | ORAL | 0 refills | Status: DC
Start: 2021-08-18 — End: 2021-09-19

## 2021-08-18 NOTE — ED Provider Notes (Signed)
?[facility] ? ? ? ?CSN: [id number] ?Arrival date & time: 08/18/21  1155 ? ? ?  ? ?History   ?Chief Complaint ?Chief Complaint  ?Patient presents with  ? Dental Pain  ? Sore Throat  ? ? ?HPI ?Katie Powell is a 31 y.o. female.  ? ?Patient presents with concerns of right lower tooth pain, sore throat, and facial swelling. She noticed a few days ago and it seems to be getting worse. The patient states when she bites down it feels like there's something there and has pain in the molar. She denies fever, headache, dizziness, or any known injury to the area. She denies known broken tooth. She has tried Tylenol medicine with minimal improvement.  ? ?The history is provided by the patient.  ?Dental Pain ?Associated symptoms: facial swelling   ?Associated symptoms: no drooling, no fever, no headaches and no oral lesions   ?Sore Throat ?Pertinent negatives include no headaches and no shortness of breath.  ? ?Past Medical History:  ?Diagnosis Date  ? Anemia   ? Anxiety   ? Blood transfusion without reported diagnosis   ? Establishing care with new doctor, encounter for 03/09/2019  ? ? ?Patient Active Problem List  ? Diagnosis Date Noted  ? B12 deficiency 10/21/2020  ? Other fatigue 06/16/2019  ? Normal vaginal delivery 03/27/2019  ? Obstetrical laceration 03/27/2019  ? Variable fetal heart rate decelerations, antepartum 03/25/2019  ? Encounter for induction of labor 03/25/2019  ? Establishing care with new doctor, encounter for 03/09/2019  ? 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 Supervision of other normal pregnancy, antepartum 09/06/2018  ? Iron deficiency anemia due to chronic blood loss 08/20/2016  ? Thrombocytosis 08/20/2016  ? ? ?Past Surgical History:  ?Procedure Laterality Date  ? EYE SURGERY    ? FINGER SURGERY    ? ? ?OB History   ? ? Gravida  ?2  ? Para  ?1  ? Term  ?1  ? Preterm  ?    ? AB  ?1  ? Living  ?1  ?  ? ? SAB  ?1  ? IAB  ?0  ? Ectopic  ?   ? Multiple  ?0  ? Live Births  ?1  ?   ?  ?  ? ? ? ?Home Medications   ? ?Prior to Admission medications   ?Medication Sig Start Date End Date Taking? Authorizing Provider  ?amoxicillin-clavulanate (AUGMENTIN) 875-125 MG tablet Take 1 tablet by mouth every 12 (twelve) hours. 08/18/21  Yes Casy Brunetto L, PA  ?acetaminophen (TYLENOL) 500 MG tablet Take 1 tablet (500 mg total) by mouth every 6 (six) hours as needed. 04/25/20   Domenic Moras, PA-C  ?ALPRAZolam (NIRAVAM) 0.5 MG dissolvable tablet Take 1 tablet (0.5 mg total) by mouth 2 (two) times daily as needed for anxiety. 12/27/20   [name], NP  ?aluminum-magnesium hydroxide 200-200 MG/5ML suspension Take 15 mLs by mouth 3 (three) times daily with meals as needed for indigestion. ?Patient not taking: Reported on 10/08/2020 01/29/20   Loura Halt A, NP  ?azelastine (ASTELIN) 0.1 % nasal spray Place 1 spray into both nostrils 2 (two) times daily. Use in each nostril as directed ?Patient not taking: Reported on 12/27/2020 11/15/20   [name], NP  ?desogestrel-ethinyl estradiol (APRI) 0.15-30 MG-MCG tablet Take 1 tablet by mouth daily. 11/15/20   [name], NP  ?ferrous sulfate 325 (  65 FE) MG tablet Take 1 tablet (325 mg total) by mouth 2 (two) times daily with a meal. 10/08/20   [name], NP  ?FLUoxetine (PROZAC) 10 MG tablet Take 1 tablet (10 mg total) by mouth daily. Increase to 2 tablets (20 mg) by mouth daily after 2 weeks. 12/27/20   [name], NP  ?fluticasone (FLONASE) 50 MCG/ACT nasal spray Place 1 spray into both nostrils daily. ?Patient not taking: Reported on 10/08/2020 09/29/19   Darr, Edison Nasuti, PA-C  ?hydrOXYzine (ATARAX) 50 MG tablet Take 1 tablet (50 mg total) by mouth 3 (three) times daily as needed. 08/13/21   [name], NP  ?oseltamivir (TAMIFLU) 75 MG capsule Take 1 capsule (75 mg total) by mouth every 12 (twelve) hours. ?Patient not taking: Reported on 10/08/2020  04/25/20   Domenic Moras, PA-C  ?pantoprazole (PROTONIX) 40 MG tablet Take 1 tablet (40 mg total) by mouth 2 (two) times daily. ?Patient not taking: Reported on 10/08/2020 03/25/20   Mauri Pole, MD  ?pantoprazole (PROTONIX) 40 MG tablet Take 1 tablet (40 mg total) by mouth 2 (two) times daily. ?Patient not taking: Reported on 10/08/2020 04/01/20   Mauri Pole, MD  ?sucralfate (CARAFATE) 1 g tablet Take 1 tablet (1 g total) by mouth 4 (four) times daily as needed. ?Patient not taking: Reported on 10/08/2020 04/01/20   Mauri Pole, MD  ?sucralfate (CARAFATE) 1 GM/10ML suspension Take 10 mLs (1 g total) by mouth 4 (four) times daily -  with meals and at bedtime. 03/25/20   Mauri Pole, MD  ?vitamin B-12 (CYANOCOBALAMIN) 1000 MCG tablet Take 1 tablet (1,000 mcg total) by mouth daily. 10/21/20   Lincoln Brigham, PA-C  ?esomeprazole (NEXIUM) 40 MG capsule Take 1 capsule (40 mg total) by mouth daily. 01/09/20 01/29/20  [name], NP  ? ? ?Family History ?Family History  ?Adopted: Yes  ? ? ?Social History ?Social History  ? ?Tobacco Use  ? Smoking status: Every Day  ?  Packs/day: 0.50  ?  Types: Cigarettes  ? Smokeless tobacco: Never  ?Vaping Use  ? Vaping Use: Never used  ?Substance Use Topics  ? Alcohol use: Not Currently  ? Drug use: No  ? ? ? ?Allergies   ?Patient has no known allergies. ? ? ?Review of Systems ?Review of Systems  ?Constitutional:  Negative for fatigue and fever.  ?HENT:  Positive for dental problem, facial swelling and sore throat. Negative for drooling, ear pain, mouth sores, trouble swallowing and voice change.   ?Respiratory:  Negative for shortness of breath.   ?Skin:  Negative for rash.  ?Neurological:  Negative for dizziness and headaches.  ? ? ?Physical Exam ?Triage Vital Signs ?ED Triage Vitals  ?Enc Vitals Group  ?   BP 08/18/21 1300 121/85  ?   Pulse Rate 08/18/21 1300 92  ?   Resp 08/18/21 1300 17  ?   Temp 08/18/21 1300 99.5 ?F (37.5 ?C)  ?   Temp Source  08/18/21 1300 Oral  ?   SpO2 08/18/21 1300 97 %  ?   Weight --   ?   Height --   ?   Head Circumference --   ?   Peak Flow --   ?   Pain Score 08/18/21 1258 10  ?   Pain Loc --   ?   Pain Edu? --   ?   Excl. in [place]? --   ? ?No data found. ? ?Updated Vital Signs ?BP 121/85  Pulse 92   Temp 99.5 ?F (37.5 ?C) (Oral)   Resp 17   SpO2 97%  ? ?Visual Acuity ?Right Eye Distance:   ?Left Eye Distance:   ?Bilateral Distance:   ? ?Right Eye Near:   ?Left Eye Near:    ?Bilateral Near:    ? ?Physical Exam ?Vitals and nursing note reviewed.  ?Constitutional:   ?   General: She is not in acute distress. ?HENT:  ?   Head: Normocephalic.  ?   Mouth/Throat:  ?   Dentition: Dental tenderness and gingival swelling present. No dental abscesses.  ?   Comments: Mild swelling and moderate tenderness overlying right lower molars. Mild gum swelling and erythema without discrete abscess visible. No trismus, submental swelling, or drooling. ?Eyes:  ?   Pupils: Pupils are equal, round, and reactive to light.  ?Cardiovascular:  ?   Rate and Rhythm: Normal rate and regular rhythm.  ?   Heart sounds: Normal heart sounds.  ?Pulmonary:  ?   Effort: Pulmonary effort is normal.  ?   Breath sounds: Normal breath sounds.  ?Lymphadenopathy:  ?   Cervical: No cervical adenopathy.  ?Skin: ?   Findings: No rash.  ?Neurological:  ?   Mental Status: She is alert.  ?Psychiatric:     ?   Mood and Affect: Mood normal.  ? ? ? ?UC Treatments / Results  ?Labs ?(all labs ordered are listed, but only abnormal results are displayed) ?Labs Reviewed - No data to display ? ?EKG ? ? ?Radiology ?No results found. ? ?Procedures ?Procedures (including critical care time) ? ?Medications Ordered in UC ?Medications - No data to display ? ?Initial Impression / Assessment and Plan / UC Course  ?I have reviewed the triage vital signs and the nursing notes. ? ?Pertinent labs & imaging results that were available during my care of the patient were reviewed by me and considered  in my medical decision making (see chart for details). ? ?  ? ?Empiric abx for dental infection, f/u with dentist. No red flags of deeper abscess. ? ?E/M: 1 acute uncomplicated illness, no data, moderate risk due

## 2021-08-18 NOTE — Discharge Instructions (Addendum)
Take antibiotics as prescribed. Salt water gargles may help with discomfort. Continue OTC pain medicine as needed. Follow-up with dentist for further management.  ?

## 2021-08-18 NOTE — ED Triage Notes (Signed)
Pt is present today with left side facial swelling, sore throat, and dental pian. Pt x started 2-3 days ago  ?

## 2021-09-19 ENCOUNTER — Other Ambulatory Visit: Payer: Self-pay

## 2021-09-19 ENCOUNTER — Emergency Department (HOSPITAL_COMMUNITY)
Admission: EM | Admit: 2021-09-19 | Discharge: 2021-09-19 | Disposition: A | Discharge status: Home / Self Care | Payer: Medicaid Other | Attending: Emergency Medicine | Admitting: Emergency Medicine

## 2021-09-19 ENCOUNTER — Encounter (HOSPITAL_COMMUNITY): Payer: Self-pay

## 2021-09-19 ENCOUNTER — Emergency Department (HOSPITAL_COMMUNITY): Payer: Medicaid Other

## 2021-09-19 DIAGNOSIS — D509 Iron deficiency anemia, unspecified: Secondary | ICD-10-CM | POA: Insufficient documentation

## 2021-09-19 DIAGNOSIS — R519 Headache, unspecified: Secondary | ICD-10-CM | POA: Diagnosis present

## 2021-09-19 DIAGNOSIS — R110 Nausea: Secondary | ICD-10-CM | POA: Diagnosis not present

## 2021-09-19 DIAGNOSIS — R051 Acute cough: Secondary | ICD-10-CM | POA: Insufficient documentation

## 2021-09-19 LAB — CBC WITH DIFFERENTIAL/PLATELET
Abs Immature Granulocytes: 0.02 10*3/uL (ref 0.00–0.07)
Basophils Absolute: 0 10*3/uL (ref 0.0–0.1)
Basophils Relative: 0 %
Eosinophils Absolute: 0.2 10*3/uL (ref 0.0–0.5)
Eosinophils Relative: 3 %
HCT: 34 % — ABNORMAL LOW (ref 36.0–46.0)
Hemoglobin: 11.1 g/dL — ABNORMAL LOW (ref 12.0–15.0)
Immature Granulocytes: 0 %
Lymphocytes Relative: 44 %
Lymphs Abs: 3.2 10*3/uL (ref 0.7–4.0)
MCH: 26.4 pg (ref 26.0–34.0)
MCHC: 32.6 g/dL (ref 30.0–36.0)
MCV: 81 fL (ref 80.0–100.0)
Monocytes Absolute: 0.6 10*3/uL (ref 0.1–1.0)
Monocytes Relative: 8 %
Neutro Abs: 3.3 10*3/uL (ref 1.7–7.7)
Neutrophils Relative %: 45 %
Platelets: 346 10*3/uL (ref 150–400)
RBC: 4.2 MIL/uL (ref 3.87–5.11)
RDW: 13.8 % (ref 11.5–15.5)
WBC: 7.3 10*3/uL (ref 4.0–10.5)
nRBC: 0 % (ref 0.0–0.2)

## 2021-09-19 LAB — COMPREHENSIVE METABOLIC PANEL
ALT: 10 U/L (ref 0–44)
AST: 15 U/L (ref 15–41)
Albumin: 3.6 g/dL (ref 3.5–5.0)
Alkaline Phosphatase: 47 U/L (ref 38–126)
Anion gap: 6 (ref 5–15)
BUN: 11 mg/dL (ref 6–20)
CO2: 20 mmol/L — ABNORMAL LOW (ref 22–32)
Calcium: 8.7 mg/dL — ABNORMAL LOW (ref 8.9–10.3)
Chloride: 111 mmol/L (ref 98–111)
Creatinine, Ser: 0.63 mg/dL (ref 0.44–1.00)
GFR, Estimated: >60 mL/min (ref >60)
Glucose, Bld: 102 mg/dL — ABNORMAL HIGH (ref 70–99)
Potassium: 3.8 mmol/L (ref 3.5–5.1)
Sodium: 137 mmol/L (ref 135–145)
Total Bilirubin: 0.6 mg/dL (ref 0.3–1.2)
Total Protein: 7.9 g/dL (ref 6.5–8.1)

## 2021-09-19 LAB — HCG, QUANTITATIVE, PREGNANCY: hCG, Beta Chain, Quant, S: <1 m[IU]/mL (ref <5)

## 2021-09-19 MED ORDER — METOCLOPRAMIDE HCL 5 MG/ML IJ SOLN
10.0000 mg | Freq: Once | INTRAMUSCULAR | Status: AC
  Administered 2021-09-19: 10 mg via INTRAVENOUS
  Filled 2021-09-19: qty 2

## 2021-09-19 NOTE — ED Provider Notes (Signed)
?[facility] DEPT ?Provider Note ? ? ?CSN: [id number] ?Arrival date & time: 09/19/21  0112 ? ?  ? ?History ? ?Chief Complaint  ?Patient presents with  ? Headache  ? Cough  ? ? ?Katie Powell is a 31 y.o. female. ? ?The history is provided by the patient and medical records.  ?Headache ?Associated symptoms: cough   ?Cough ?Associated symptoms: headaches   ?Katie Powell is a 31 y.o. female who presents to the Emergency Department complaining of headache.  She presents to the emergency department for evaluation of headache that started 3 hours ago.  Is located in the frontal and right side of her head.  This started when she was in the middle of a coughing spell.  She took acetaminophen without relief and is having difficulty getting comfortable due to the headache.  She did have a similar headache earlier in the week but that did resolve on its own.  This is atypical for her.  She does have nausea.  She started coughing 2 weeks ago.  Overall this is partially improving.  No hemoptysis.  No lower extremity edema.  She has a history of iron deficiency anemia.  She does take OCPs.  No history of DVT/PE. ? ? ?On ocp.  ?Uses tobacco.   ?Family hx is unknown (adopted) ?  ? ?Home Medications ?Prior to Admission medications   ?Medication Sig Start Date End Date Taking? Authorizing Provider  ?acetaminophen (TYLENOL) 500 MG tablet Take 1 tablet (500 mg total) by mouth every 6 (six) hours as needed. ?Patient taking differently: Take 1,000 mg by mouth every 6 (six) hours as needed for moderate pain. 04/25/20  Yes Domenic Moras, PA-C  ?desogestrel-ethinyl estradiol (APRI) 0.15-30 MG-MCG tablet Take 1 tablet by mouth daily. 11/15/20  Yes [name], NP  ?ferrous sulfate 325 (65 FE) MG tablet Take 1 tablet (325 mg total) by mouth 2 (two) times daily with a meal. ?Patient taking differently: Take 325 mg by mouth daily. 10/08/20  Yes [name], NP  ?hydrOXYzine (ATARAX) 50 MG tablet Take 1 tablet  (50 mg total) by mouth 3 (three) times daily as needed. ?Patient taking differently: Take 50 mg by mouth daily as needed for anxiety. 08/13/21  Yes [name], NP  ?esomeprazole (NEXIUM) 40 MG capsule Take 1 capsule (40 mg total) by mouth daily. 01/09/20 01/29/20  [name], NP  ?   ? ?Allergies    ?Patient has no known allergies.   ? ?Review of Systems   ?Review of Systems  ?Respiratory:  Positive for cough.   ?Neurological:  Positive for headaches.  ?All other systems reviewed and are negative. ? ?Physical Exam ?Updated Vital Signs ?BP 122/86   Pulse 63   Temp 98.4 ?F (36.9 ?C) (Oral)   Resp 16   Ht '5\' 6"'$  (1.676 m)   Wt 90.7 kg   SpO2 98%   BMI 32.28 kg/m?  ?Physical Exam ?Vitals and nursing note reviewed.  ?Constitutional:   ?   Appearance: She is well-developed.  ?HENT:  ?   Head: Normocephalic and atraumatic.  ?Eyes:  ?   Extraocular Movements: Extraocular movements intact.  ?   Pupils: Pupils are equal, round, and reactive to light.  ?Cardiovascular:  ?   Rate and Rhythm: Normal rate and regular rhythm.  ?   Heart sounds: No murmur heard. ?Pulmonary:  ?   Effort: Pulmonary effort is normal. No respiratory distress.  ?   Breath sounds: Normal breath sounds.  ?Abdominal:  ?  Palpations: Abdomen is soft.  ?   Tenderness: There is no abdominal tenderness. There is no guarding or rebound.  ?Musculoskeletal:     ?   General: No tenderness.  ?   Cervical back: Neck supple.  ?Skin: ?   General: Skin is warm and dry.  ?Neurological:  ?   Mental Status: She is alert and oriented to person, place, and time.  ?   Comments: No asymmetry of facial movements.  5 out of 5 strength in all 4 extremities  ?Psychiatric:     ?   Behavior: Behavior normal.  ? ? ?ED Results / Procedures / Treatments   ?Labs ?(all labs ordered are listed, but only abnormal results are displayed) ?Labs Reviewed  ?CBC WITH DIFFERENTIAL/PLATELET - Abnormal; Notable for the following components:  ?    Result Value  ? Hemoglobin 11.1 (*)    ? HCT 34.0 (*)   ? All other components within normal limits  ?COMPREHENSIVE METABOLIC PANEL - Abnormal; Notable for the following components:  ? CO2 20 (*)   ? Glucose, Bld 102 (*)   ? Calcium 8.7 (*)   ? All other components within normal limits  ?HCG, QUANTITATIVE, PREGNANCY  ? ? ?EKG ?None ? ?Radiology ?CT Head Wo Contrast ? ?Result Date: 09/19/2021 ?CLINICAL DATA:  Headache EXAM: CT HEAD WITHOUT CONTRAST TECHNIQUE: Contiguous axial images were obtained from the base of the skull through the vertex without intravenous contrast. RADIATION DOSE REDUCTION: This exam was performed according to the departmental dose-optimization program which includes automated exposure control, adjustment of the mA and/or kV according to patient size and/or use of iterative reconstruction technique. COMPARISON:  02/19/2021 FINDINGS: Brain: No acute intracranial abnormality. Specifically, no hemorrhage, hydrocephalus, mass lesion, acute infarction, or significant intracranial injury. Vascular: No hyperdense vessel or unexpected calcification. Skull: No acute calvarial abnormality. Sinuses/Orbits: No acute findings Other: None IMPRESSION: No acute intracranial abnormality. Electronically Signed   By: Rolm Baptise M.D.   On: 09/19/2021 02:45   ? ?Procedures ?Procedures  ? ? ?Medications Ordered in ED ?Medications  ?metoCLOPramide (REGLAN) injection 10 mg (10 mg Intravenous Given 09/19/21 0210)  ? ? ?ED Course/ Medical Decision Making/ A&P ?  ?                        ?Medical Decision Making ?Amount and/or Complexity of Data Reviewed ?Labs: ordered. ?Radiology: ordered. ? ?Risk ?Prescription drug management. ? ? ?Patient with history of iron deficiency anemia, OCP use here for evaluation of headache that started abruptly after a coughing spell.  She is neurologically intact on evaluation.  Given sudden onset severe headache a CT head was obtained, which is negative for acute intracranial abnormality.  Current clinical picture is not  consistent with aneurysm, subarachnoid hemorrhage, meningitis, dural sinus thrombosis.  After treatment with medications in the department her headache has resolved.  CBC with mild anemia, no indication for transfusion at this time.  In terms of her cough, this is overall improving.  Lungs are clear with no respiratory distress.  Current clinical picture is not consistent with pneumonia, PE, CHF.  Discussed with patient increased risk of clots with tobacco use and OCP use.  Encouraged cessation of tobacco use.  Plan to discharge home with outpatient follow-up and return precautions. ? ? ? ? ? ? ? ?Final Clinical Impression(s) / ED Diagnoses ?Final diagnoses:  ?Bad headache  ?Acute cough  ? ? ?Rx / DC Orders ?ED Discharge Orders   ? ?  None  ? ?  ? ? ?  ?Quintella Reichert, MD ?09/19/21 [phone] ? ?

## 2021-09-19 NOTE — ED Triage Notes (Signed)
Pt reports with headache and cough x 2 days.  ?

## 2021-11-16 ENCOUNTER — Other Ambulatory Visit: Payer: Self-pay | Admitting: Registered Nurse

## 2021-11-16 DIAGNOSIS — F410 Panic disorder [episodic paroxysmal anxiety] without agoraphobia: Secondary | ICD-10-CM

## 2021-11-17 MED ORDER — HYDROXYZINE HCL 50 MG PO TABS: 50.0000 mg | ORAL_TABLET | Freq: Three times a day (TID) | ORAL | 0 refills | Status: DC | PRN

## 2021-12-03 ENCOUNTER — Telehealth: Payer: Self-pay | Admitting: Registered Nurse

## 2021-12-03 ENCOUNTER — Other Ambulatory Visit: Payer: Self-pay | Admitting: Registered Nurse

## 2021-12-03 DIAGNOSIS — Z3009 Encounter for other general counseling and advice on contraception: Secondary | ICD-10-CM

## 2021-12-03 NOTE — Telephone Encounter (Signed)
Encourage patient to contact the pharmacy for refills or they can request refills through [facility]  (Please schedule appointment if patient has not been seen in over a year)    [name] TO: Katie Powell [phone]  MEDICATION NAME & DOSE: desogestrel-ethinyl estradiol 0.15-30 mg  NOTES/COMMENTS FROM PATIENT:      [name] office please notify patient: It takes 48-72 hours to process rx refill requests Ask patient to call pharmacy to ensure rx is ready before heading there.

## 2021-12-19 ENCOUNTER — Encounter (HOSPITAL_COMMUNITY): Payer: Self-pay

## 2021-12-19 ENCOUNTER — Emergency Department (HOSPITAL_COMMUNITY)
Admission: EM | Admit: 2021-12-19 | Discharge: 2021-12-20 | Disposition: A | Discharge status: Home / Self Care | Payer: Medicaid Other | Attending: Emergency Medicine | Admitting: Emergency Medicine

## 2021-12-19 ENCOUNTER — Emergency Department (HOSPITAL_COMMUNITY): Payer: Medicaid Other

## 2021-12-19 ENCOUNTER — Other Ambulatory Visit: Payer: Self-pay

## 2021-12-19 DIAGNOSIS — N83201 Unspecified ovarian cyst, right side: Secondary | ICD-10-CM | POA: Diagnosis not present

## 2021-12-19 DIAGNOSIS — R1110 Vomiting, unspecified: Secondary | ICD-10-CM | POA: Insufficient documentation

## 2021-12-19 DIAGNOSIS — R109 Unspecified abdominal pain: Secondary | ICD-10-CM | POA: Diagnosis present

## 2021-12-19 DIAGNOSIS — D500 Iron deficiency anemia secondary to blood loss (chronic): Secondary | ICD-10-CM | POA: Insufficient documentation

## 2021-12-19 DIAGNOSIS — K5900 Constipation, unspecified: Secondary | ICD-10-CM | POA: Diagnosis not present

## 2021-12-19 DIAGNOSIS — E876 Hypokalemia: Secondary | ICD-10-CM | POA: Insufficient documentation

## 2021-12-19 DIAGNOSIS — R519 Headache, unspecified: Secondary | ICD-10-CM | POA: Insufficient documentation

## 2021-12-19 LAB — URINALYSIS, ROUTINE W REFLEX MICROSCOPIC
Bacteria, UA: NONE SEEN
Glucose, UA: NEGATIVE mg/dL
Hgb urine dipstick: NEGATIVE
Ketones, ur: 5 mg/dL — AB
Nitrite: NEGATIVE
Protein, ur: 30 mg/dL — AB
Specific Gravity, Urine: 1.036 — ABNORMAL HIGH (ref 1.005–1.030)
pH: 5 (ref 5.0–8.0)

## 2021-12-19 LAB — COMPREHENSIVE METABOLIC PANEL
ALT: 10 U/L (ref 0–44)
AST: 13 U/L — ABNORMAL LOW (ref 15–41)
Albumin: 3.8 g/dL (ref 3.5–5.0)
Alkaline Phosphatase: 43 U/L (ref 38–126)
Anion gap: 9 (ref 5–15)
BUN: 13 mg/dL (ref 6–20)
CO2: 20 mmol/L — ABNORMAL LOW (ref 22–32)
Calcium: 9 mg/dL (ref 8.9–10.3)
Chloride: 109 mmol/L (ref 98–111)
Creatinine, Ser: 0.67 mg/dL (ref 0.44–1.00)
GFR, Estimated: >60 mL/min (ref >60)
Glucose, Bld: 123 mg/dL — ABNORMAL HIGH (ref 70–99)
Potassium: 3.3 mmol/L — ABNORMAL LOW (ref 3.5–5.1)
Sodium: 138 mmol/L (ref 135–145)
Total Bilirubin: 0.6 mg/dL (ref 0.3–1.2)
Total Protein: 8 g/dL (ref 6.5–8.1)

## 2021-12-19 LAB — I-STAT BETA HCG BLOOD, ED (MC, WL, AP ONLY): I-stat hCG, quantitative: <5 m[IU]/mL (ref <5)

## 2021-12-19 LAB — CBC WITH DIFFERENTIAL/PLATELET
Abs Immature Granulocytes: 0.02 10*3/uL (ref 0.00–0.07)
Basophils Absolute: 0 10*3/uL (ref 0.0–0.1)
Basophils Relative: 0 %
Eosinophils Absolute: 0.1 10*3/uL (ref 0.0–0.5)
Eosinophils Relative: 1 %
HCT: 31.6 % — ABNORMAL LOW (ref 36.0–46.0)
Hemoglobin: 9.7 g/dL — ABNORMAL LOW (ref 12.0–15.0)
Immature Granulocytes: 0 %
Lymphocytes Relative: 33 %
Lymphs Abs: 2.8 10*3/uL (ref 0.7–4.0)
MCH: 22.9 pg — ABNORMAL LOW (ref 26.0–34.0)
MCHC: 30.7 g/dL (ref 30.0–36.0)
MCV: 74.5 fL — ABNORMAL LOW (ref 80.0–100.0)
Monocytes Absolute: 0.6 10*3/uL (ref 0.1–1.0)
Monocytes Relative: 7 %
Neutro Abs: 4.8 10*3/uL (ref 1.7–7.7)
Neutrophils Relative %: 59 %
Platelets: 359 10*3/uL (ref 150–400)
RBC: 4.24 MIL/uL (ref 3.87–5.11)
RDW: 15.1 % (ref 11.5–15.5)
WBC: 8.4 10*3/uL (ref 4.0–10.5)
nRBC: 0 % (ref 0.0–0.2)

## 2021-12-19 LAB — LIPASE, BLOOD: Lipase: 33 U/L (ref 11–51)

## 2021-12-19 LAB — MAGNESIUM: Magnesium: 2 mg/dL (ref 1.7–2.4)

## 2021-12-19 MED ORDER — MORPHINE SULFATE (PF) 4 MG/ML IV SOLN
4.0000 mg | Freq: Once | INTRAVENOUS | Status: AC
  Administered 2021-12-19: 4 mg via INTRAVENOUS
  Filled 2021-12-19: qty 1

## 2021-12-19 MED ORDER — SODIUM CHLORIDE 0.9 % IV BOLUS
500.0000 mL | Freq: Once | INTRAVENOUS | Status: AC
  Administered 2021-12-19: 500 mL via INTRAVENOUS

## 2021-12-19 MED ORDER — IOHEXOL 300 MG/ML  SOLN
100.0000 mL | Freq: Once | INTRAMUSCULAR | Status: AC | PRN
  Administered 2021-12-19: 100 mL via INTRAVENOUS

## 2021-12-19 MED ORDER — ONDANSETRON 8 MG PO TBDP
8.0000 mg | ORAL_TABLET | Freq: Once | ORAL | Status: AC
  Administered 2021-12-19: 8 mg via ORAL
  Filled 2021-12-19: qty 1

## 2021-12-19 MED ORDER — ONDANSETRON HCL 4 MG PO TABS: 4.0000 mg | ORAL_TABLET | Freq: Four times a day (QID) | ORAL | 0 refills | Status: DC

## 2021-12-19 MED ORDER — POTASSIUM CHLORIDE CRYS ER 20 MEQ PO TBCR
40.0000 meq | EXTENDED_RELEASE_TABLET | Freq: Once | ORAL | Status: AC
  Administered 2021-12-19: 40 meq via ORAL
  Filled 2021-12-19: qty 2

## 2021-12-19 MED ORDER — MAGNESIUM CITRATE PO SOLN
1.0000 | Freq: Once | ORAL | 0 refills | Status: AC
Start: 2021-12-19 — End: 2021-12-19

## 2021-12-19 NOTE — ED Triage Notes (Signed)
Pt reports with constipation x 4 days, and a headache and vomiting since today.

## 2021-12-19 NOTE — ED Provider Notes (Signed)
[facility] DEPT Provider Note   CSN: [id number] Arrival date & time: 12/19/21  2006     History  Chief Complaint  Patient presents with   Constipation   Emesis   Headache    Katie Powell is a 31 y.o. female.   Constipation Associated symptoms: vomiting   Emesis Associated symptoms: headaches   Headache Associated symptoms: vomiting    31 year old female presents emergency department with complaints of abdominal pain.  Patient states symptoms been present for approximately 2 days.  She states she has not had a bowel movement for 4 days and is concerned about constipation.  She notes 2 days of nausea and vomiting.  Denies any prior abdominal surgeries, fever, chest pain, shortness of breath, hematemesis, hematochezia, melena, urinary symptoms.  She does states she has been having persistent regular heavy of which her primary care has been aware of and been regulating with different birth control methods.  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  She denies any known vaginal discharge.  Past medical history significant for anxiety, anemia with blood transfusion,  Home Medications Prior to Admission medications   Medication Sig Start Date End Date Taking? Authorizing Provider  magnesium citrate SOLN Take 296 mLs (1 Bottle total) by mouth once for 1 dose. 12/19/21 12/19/21 Yes Dion Saucier A, PA  ondansetron (ZOFRAN) 4 MG tablet Take 1 tablet (4 mg total) by mouth every 6 (six) hours. 12/19/21  Yes Dion Saucier A, PA  acetaminophen (TYLENOL) 500 MG tablet Take 1 tablet (500 mg total) by mouth every 6 (six) hours as needed. Patient taking differently: Take 1,000 mg by mouth every 6 (six) hours as needed for moderate pain. 04/25/20   Domenic Moras, PA-C  APRI 0.15-30 MG-MCG tablet TAKE ONE TABLET BY MOUTH DAILY 12/03/21   [name], NP  ferrous sulfate 325 (65 FE) MG tablet Take 1 tablet  (325 mg total) by mouth 2 (two) times daily with a meal. Patient taking differently: Take 325 mg by mouth daily. 10/08/20   [name], NP  hydrOXYzine (ATARAX) 50 MG tablet Take 1 tablet (50 mg total) by mouth 3 (three) times daily as needed. 11/17/21   [name], NP  esomeprazole (NEXIUM) 40 MG capsule Take 1 capsule (40 mg total) by mouth daily. 01/09/20 01/29/20  [name], NP      Allergies    Patient has no known allergies.    Review of Systems   Review of Systems  Gastrointestinal:  Positive for constipation and vomiting.  Neurological:  Positive for headaches.    Physical Exam Updated Vital Signs BP (!) 134/96 (BP Location: Right Arm)   Pulse 84   Temp 99.8 F (37.7 C) (Oral)   Resp 16   SpO2 96%  Physical Exam Vitals and nursing note reviewed.  Constitutional:      General: She is not in acute distress.    Appearance: She is well-developed.  HENT:     Head: Normocephalic and atraumatic.  Eyes:     Conjunctiva/sclera: Conjunctivae normal.  Cardiovascular:     Rate and Rhythm: Normal rate and regular rhythm.     Heart sounds: No murmur heard. Pulmonary:     Effort: Pulmonary effort is normal. No respiratory distress.     Breath sounds: Normal breath sounds.  Abdominal:     Palpations: Abdomen is soft.     Tenderness: There is abdominal tenderness in the right upper quadrant  and right lower quadrant. There is no right CVA tenderness, left CVA tenderness or rebound.  Musculoskeletal:        General: No swelling.     Cervical back: Neck supple.  Skin:    General: Skin is warm and dry.     Capillary Refill: Capillary refill takes less than 2 seconds.  Neurological:     Mental Status: She is alert.  Psychiatric:        Mood and Affect: Mood normal.     ED Results / Procedures / Treatments   Labs (all labs ordered are listed, but only abnormal results are displayed) Labs Reviewed  CBC WITH DIFFERENTIAL/PLATELET - Abnormal; Notable for the  following components:      Result Value   Hemoglobin 9.7 (*)    HCT 31.6 (*)    MCV 74.5 (*)    MCH 22.9 (*)    All other components within normal limits  COMPREHENSIVE METABOLIC PANEL - Abnormal; Notable for the following components:   Potassium 3.3 (*)    CO2 20 (*)    Glucose, Bld 123 (*)    AST 13 (*)    All other components within normal limits  URINALYSIS, ROUTINE W REFLEX MICROSCOPIC - Abnormal; Notable for the following components:   Color, Urine AMBER (*)    APPearance HAZY (*)    Specific Gravity, Urine 1.036 (*)    Bilirubin Urine SMALL (*)    Ketones, ur 5 (*)    Protein, ur 30 (*)    Leukocytes,Ua TRACE (*)    All other components within normal limits  LIPASE, BLOOD  MAGNESIUM  I-STAT BETA HCG BLOOD, ED ([facility], [facility], [facility] ONLY)    EKG None  Radiology CT Abdomen Pelvis W Contrast  Result Date: 12/19/2021 CLINICAL DATA:  Constipation x4 days with headache and vomiting. EXAM: CT ABDOMEN AND PELVIS WITH CONTRAST TECHNIQUE: Multidetector CT imaging of the abdomen and pelvis was performed using the standard protocol following bolus administration of intravenous contrast. RADIATION DOSE REDUCTION: This exam was performed according to the departmental dose-optimization program which includes automated exposure control, adjustment of the mA and/or kV according to patient size and/or use of iterative reconstruction technique. CONTRAST:  14m OMNIPAQUE IOHEXOL 300 MG/ML  SOLN COMPARISON:  None Available. FINDINGS: Lower chest: No acute abnormality. A 2.8 cm x 1.7 cm x 2.5 cm pericardial cyst is seen within the anteromedial aspect of the right lung base. Hepatobiliary: No focal liver abnormality is seen. No gallstones, gallbladder wall thickening, or biliary dilatation. Pancreas: Unremarkable. No pancreatic ductal dilatation or surrounding inflammatory changes. Spleen: Normal in size without focal abnormality. Adrenals/Urinary Tract: Adrenal glands are unremarkable. Kidneys are normal,  without renal calculi, focal lesion, or hydronephrosis. The urinary bladder is poorly distended and subsequently limited in evaluation. Stomach/Bowel: Stomach is within normal limits. Appendix appears normal. Stool is seen throughout the large bowel. No evidence of bowel wall thickening, distention, or inflammatory changes. Vascular/Lymphatic: No significant vascular findings are present. No enlarged abdominal or pelvic lymph nodes. Reproductive: The uterus is unremarkable. A 3.2 cm diameter simple cyst is seen within the right adnexa. The left adnexa is unremarkable. Other: No abdominal wall hernia or abnormality. No abdominopelvic ascites. Musculoskeletal: No acute or significant osseous findings. IMPRESSION: 1. 3.2 cm diameter right adnexal cyst, likely ovarian in origin. No follow-up imaging is recommended. Reference: JACR 2020 Feb;17(2):248-254 2. Large stool burden without evidence of bowel obstruction. Electronically Signed   By: TVirgina NorfolkM.D.   On: 12/19/2021  22:19    Procedures Procedures    Medications Ordered in ED Medications  ondansetron (ZOFRAN-ODT) disintegrating tablet 8 mg (8 mg Oral Given 12/19/21 2157)  sodium chloride 0.9 % bolus 500 mL (500 mLs Intravenous New Bag/Given 12/19/21 2157)  potassium chloride SA (KLOR-CON M) CR tablet 40 mEq (40 mEq Oral Given 12/19/21 2157)  morphine (PF) 4 MG/ML injection 4 mg (4 mg Intravenous Given 12/19/21 2157)  iohexol (OMNIPAQUE) 300 MG/ML solution 100 mL (100 mLs Intravenous Contrast Given 12/19/21 2205)    ED Course/ Medical Decision Making/ A&P                           Medical Decision Making Amount and/or Complexity of Data Reviewed Labs: ordered. Radiology: ordered.  Risk Prescription drug management.   This patient presents to the ED for concern of abdominal pain, this involves an extensive number of treatment options, and is a complaint that carries with it a high risk of complications and morbidity.  The differential  diagnosis includes The causes of generalized abdominal pain include but are not limited to AAA, mesenteric ischemia, appendicitis, diverticulitis, DKA, gastritis, gastroenteritis, AMI, nephrolithiasis, pancreatitis, peritonitis, adrenal insufficiency,lead poisoning, iron toxicity, intestinal ischemia, constipation, UTI,SBO/LBO, splenic rupture, biliary disease, IBD, IBS, PUD, or hepatitis. Ectopic pregnancy, ovarian torsion, PID.   Co morbidities that complicate the patient evaluation  See HPI   Additional history obtained:  Additional history obtained from EMR External records from outside source obtained and reviewed including GFR from 09/19/2021 indicating greater than 60.     Lab Tests:  I Ordered, and personally interpreted labs.  The pertinent results include: Potassium 3.3 which is supplemented orally with 40 mEq of potassium.  Magnesium level 2.0.  Lipase within normal limits.  No leukocytosis with no left shift. Hemoglobin 9.7 indicating patient's anemia.  Patient seems to be anemic at baseline but has had to have blood transfusions in the past.  She states that the loss has come from vaginal bleeding as mentioned in HPI..  Bicarb 20.   Imaging Studies ordered:  I ordered imaging studies including CT abdomen pelvis I independently visualized and interpreted imaging which showed 3.2 cm right adnexal cyst.  Large stool burden without evidence of bowel obstruction. I agree with the radiologist interpretation  Cardiac Monitoring: / EKG:  The patient was maintained on a cardiac monitor.  I personally viewed and interpreted the cardiac monitored which showed an underlying rhythm of: Sinus rhythm   Consultations Obtained:  N/a   Problem List / ED Course / Critical interventions / Medication management  Abdominal pain I ordered medication including Zofran for nausea/vomiting, morphine for pain, potassium hypokalemia, normal saline for rehydration  Reevaluation of the patient  after these medicines showed that the patient improved I have reviewed the patients home medicines and have made adjustments as needed   Social Determinants of Health:  Denies tobacco, illicit drug use.   Test / Admission - Considered:  Abdominal pain/constipation Vitals signs significant for mild hypertension with a blood pressure 134/96.  Recommend close follow-up with PCP regarding blood pressure.. Otherwise within normal range and stable throughout visit. Laboratory/imaging studies significant for: See above Patient symptoms most likely secondary to large stool burden given her current state of constipation.  CT indicated no evidence of bowel obstruction or stool impaction.  Home therapy recommended with magnesium citrate.  Given her intermittent dealings with constipation secondary to iron supplementation, fiber supplementation as well as possible intermittent MiraLAX  use recommended after current obstruction is resolved.  Recommend close follow-up with PCP regarding persistent vaginal bleeding for potential alteration in contraceptive therapy.  OB/GYN information also provided discharge papers per patient request. Treatment plan was discussed at length with patient and she knowledge understanding was agreeable to said plan. Worrisome signs and symptoms were discussed with the patient, and the patient acknowledged understanding to return to the ED if noticed. Patient was stable upon discharge.         Final Clinical Impression(s) / ED Diagnoses Final diagnoses:  Constipation, unspecified constipation type  Iron deficiency anemia due to chronic blood loss  Cyst of right ovary    Rx / DC Orders ED Discharge Orders          Ordered    magnesium citrate SOLN   Once        12/19/21 2306    ondansetron (ZOFRAN) 4 MG tablet  Every 6 hours        12/19/21 2306              Wilnette Kales, [place] 12/19/21 2306    Lajean Saver, MD 12/19/21 2313

## 2021-12-19 NOTE — ED Provider Triage Note (Signed)
Emergency Medicine Provider Triage Evaluation Note  Katie Powell , a 31 y.o. female  was evaluated in triage.  Pt complains of abdominal pain.  Patient states symptoms been present for approximately 2 days.  She states she has not had a bowel movement for 4 days and is concerned about constipation.  She notes 2 days of nausea and vomiting.  Denies any prior abdominal surgeries, fever, chest pain, shortness of breath, hematemesis, medic easier, melena.  Review of Systems  Positive: See above Negative:   Physical Exam  BP (!) 134/96 (BP Location: Right Arm)   Pulse 84   Temp 99.8 F (37.7 C) (Oral)   Resp 16   SpO2 96%  Gen:   Awake, no distress   Resp:  Normal effort  MSK:   Moves extremities without difficulty  Other:  Right lower and right upper quadrant tenderness.  No CVA tenderness bilaterally.  Medical Decision Making  Medically screening exam initiated at 8:37 PM.  Appropriate orders placed.  FLYNN LININGER was informed that the remainder of the evaluation will be completed by another provider, this initial triage assessment does not replace that evaluation, and the importance of remaining in the ED until their evaluation is complete.    Wilnette Kales, [place] 12/19/21 2041

## 2021-12-19 NOTE — Discharge Instructions (Addendum)
Note that your work-up today was overall consistent for suspected diagnosis of constipation given your large stool burden.  I will prescribe magnesium citrate solution to help get your bowels moving.  I will also prescribe Zofran to take as needed for nausea/vomiting until you are able to get your bowels moving.  Consider over-the-counter MiraLAX as well as fiber supplementation, dietary changes as well as oral hydration to help avoid constipation in the future.  Follow-up with your primary care provider regarding your anemia for possible medication changes.  I have also provided contact information for a couple OB/GYN in the area for you to establish care.  Please do not hesitate to return to the emergency department if the worrisome signs and symptoms we discussed become apparent.

## 2021-12-31 NOTE — Progress Notes (Unsigned)
   New Patient Visit  There were no vitals taken for this visit.   Subjective:    Patient ID: Katie Powell, female    DOB: Oct 16, 1990, 31 y.o.   MRN: [id number]  CC: No chief complaint on file.   HPI: Katie Powell is a 31 y.o. female presents to transfer care to a new provider.  Introduced to Designer, jewellery role and practice setting.  All questions answered.  Discussed provider/patient relationship and expectations.   Past Medical History:  Diagnosis Date   Anemia    Anxiety    Blood transfusion without reported diagnosis    Establishing care with new doctor, encounter for 03/09/2019    Past Surgical History:  Procedure Laterality Date   EYE SURGERY     FINGER SURGERY      Family History  Adopted: Yes     Social History   Tobacco Use   Smoking status: Every Day    Packs/day: 0.50    Types: Cigarettes   Smokeless tobacco: Never  Vaping Use   Vaping Use: Never used  Substance Use Topics   Alcohol use: Not Currently   Drug use: No    Current Outpatient Medications on File Prior to Visit  Medication Sig Dispense Refill   acetaminophen (TYLENOL) 500 MG tablet Take 1 tablet (500 mg total) by mouth every 6 (six) hours as needed. (Patient taking differently: Take 1,000 mg by mouth every 6 (six) hours as needed for moderate pain.) 30 tablet 0   APRI 0.15-30 MG-MCG tablet TAKE ONE TABLET BY MOUTH DAILY 28 tablet 11   ferrous sulfate 325 (65 FE) MG tablet Take 1 tablet (325 mg total) by mouth 2 (two) times daily with a meal. (Patient taking differently: Take 325 mg by mouth daily.) 180 tablet 0   hydrOXYzine (ATARAX) 50 MG tablet Take 1 tablet (50 mg total) by mouth 3 (three) times daily as needed. 60 tablet 0   ondansetron (ZOFRAN) 4 MG tablet Take 1 tablet (4 mg total) by mouth every 6 (six) hours. 12 tablet 0   [DISCONTINUED] esomeprazole (NEXIUM) 40 MG capsule Take 1 capsule (40 mg total) by mouth daily. 30 capsule 3   Current Facility-Administered  Medications on File Prior to Visit  Medication Dose Route Frequency Provider Last Rate Last Admin   0.9 %  sodium chloride infusion  500 mL Intravenous Once Nandigam, Venia Minks, MD         Review of Systems      Objective:    There were no vitals taken for this visit.  Wt Readings from Last 3 Encounters:  09/19/21 200 lb (90.7 kg)  07/03/21 201 lb 6.4 oz (91.4 kg)  12/27/20 202 lb 12.8 oz (92 kg)    BP Readings from Last 3 Encounters:  12/20/21 (!) 122/90  09/19/21 124/88  08/18/21 121/85    Physical Exam     Assessment & Plan:   Problem List Items Addressed This Visit   None    Follow up plan: No follow-ups on file.

## 2022-01-01 ENCOUNTER — Encounter: Payer: Self-pay | Admitting: Nurse Practitioner

## 2022-01-01 ENCOUNTER — Ambulatory Visit (INDEPENDENT_AMBULATORY_CARE_PROVIDER_SITE_OTHER): Payer: Medicaid Other | Admitting: Nurse Practitioner

## 2022-01-01 ENCOUNTER — Telehealth: Payer: Self-pay | Admitting: Nurse Practitioner

## 2022-01-01 VITALS — BP 116/84 | HR 77 | Temp 97.3°F | Ht 66.0 in | Wt 201.8 lb

## 2022-01-01 DIAGNOSIS — E538 Deficiency of other specified B group vitamins: Secondary | ICD-10-CM

## 2022-01-01 DIAGNOSIS — R7301 Impaired fasting glucose: Secondary | ICD-10-CM | POA: Diagnosis not present

## 2022-01-01 DIAGNOSIS — D500 Iron deficiency anemia secondary to blood loss (chronic): Secondary | ICD-10-CM

## 2022-01-01 DIAGNOSIS — R82998 Other abnormal findings in urine: Secondary | ICD-10-CM

## 2022-01-01 DIAGNOSIS — F32A Depression, unspecified: Secondary | ICD-10-CM

## 2022-01-01 DIAGNOSIS — F419 Anxiety disorder, unspecified: Secondary | ICD-10-CM | POA: Diagnosis not present

## 2022-01-01 DIAGNOSIS — Z720 Tobacco use: Secondary | ICD-10-CM

## 2022-01-01 DIAGNOSIS — K5900 Constipation, unspecified: Secondary | ICD-10-CM | POA: Diagnosis not present

## 2022-01-01 LAB — POCT URINALYSIS DIPSTICK
Bilirubin, UA: NEGATIVE
Blood, UA: NEGATIVE
Glucose, UA: NEGATIVE
Ketones, UA: NEGATIVE
Leukocytes, UA: NEGATIVE
Nitrite, UA: NEGATIVE
Protein, UA: NEGATIVE
Spec Grav, UA: 1.01 (ref 1.010–1.025)
Urobilinogen, UA: 0.2 E.U./dL
pH, UA: 6.5 (ref 5.0–8.0)

## 2022-01-01 LAB — CBC
HCT: 32.4 % — ABNORMAL LOW (ref 36.0–46.0)
Hemoglobin: 10.3 g/dL — ABNORMAL LOW (ref 12.0–15.0)
MCHC: 31.8 g/dL (ref 30.0–36.0)
MCV: 71 fl — ABNORMAL LOW (ref 78.0–100.0)
Platelets: 395 10*3/uL (ref 150.0–400.0)
RBC: 4.56 Mil/uL (ref 3.87–5.11)
RDW: 15.8 % — ABNORMAL HIGH (ref 11.5–15.5)
WBC: 5.7 10*3/uL (ref 4.0–10.5)

## 2022-01-01 LAB — BASIC METABOLIC PANEL
BUN: 11 mg/dL (ref 6–23)
CO2: 25 mEq/L (ref 19–32)
Calcium: 9.3 mg/dL (ref 8.4–10.5)
Chloride: 104 mEq/L (ref 96–112)
Creatinine, Ser: 0.76 mg/dL (ref 0.40–1.20)
GFR: 106.45 mL/min (ref >60.00)
Glucose, Bld: 69 mg/dL — ABNORMAL LOW (ref 70–99)
Potassium: 3.5 mEq/L (ref 3.5–5.1)
Sodium: 137 mEq/L (ref 135–145)

## 2022-01-01 LAB — VITAMIN B12: Vitamin B-12: 152 pg/mL — ABNORMAL LOW (ref 211–911)

## 2022-01-01 LAB — TSH: TSH: 0.67 u[IU]/mL (ref 0.35–5.50)

## 2022-01-01 LAB — HEMOGLOBIN A1C: Hgb A1c MFr Bld: 5.5 % (ref 4.6–6.5)

## 2022-01-01 MED ORDER — POLYETHYLENE GLYCOL 3350 17 GM/SCOOP PO POWD: 17.0000 g | Freq: Two times a day (BID) | ORAL | 1 refills | Status: DC | PRN

## 2022-01-01 MED ORDER — ESCITALOPRAM OXALATE 10 MG PO TABS: 10.0000 mg | ORAL_TABLET | Freq: Every day | ORAL | 1 refills | Status: DC

## 2022-01-01 NOTE — Telephone Encounter (Signed)
Order clarified and resent to Katie Powell

## 2022-01-01 NOTE — Telephone Encounter (Signed)
Jacky Kindle @ [facility] is needing to know---how much quantity to dispense of polyethylene glycol powder (GLYCOLAX/MIRALAX) 17 GM/SCOOP powder [[id number]]. Please advise @ [phone]

## 2022-01-01 NOTE — Assessment & Plan Note (Signed)
She has a history of B12 deficiency, we will check vitamin B12 levels today.

## 2022-01-01 NOTE — Assessment & Plan Note (Signed)
She currently smokes about half pack a day.  Encourage complete tobacco cessation.

## 2022-01-01 NOTE — Patient Instructions (Signed)
It was great to see you!  Start miralax twice a day and if still not having a bowel movement in 3 days, increase to 3 times a day. After a few regular bowel movements, decrease back down to once a day. Increasing fiber in your foods or supplement is still a great idea. Make sure you drinking plenty of fluids.   Start lexapro 1 tablet daily for your anxiety. You can continue the hydroxyzine as needed.   We are checking your labs today and will let you know the results via mychart/phone.   Let's follow-up in 4-6 weeks, sooner if you have concerns.  If a referral was placed today, you will be contacted for an appointment. Please note that routine referrals can sometimes take up to 3-4 weeks to process. Please call our office if you haven't heard anything after this time frame.  Take care,  Vance Peper, NP

## 2022-01-01 NOTE — Assessment & Plan Note (Signed)
She has a history of iron deficiency anemia due to heavy menstrual periods.  She is working with her GYN to find a birth control that will help control her menstrual periods.  She has been holding off on her iron supplement since she has been experiencing constipation.  We will check CBC and iron panel today.

## 2022-01-01 NOTE — Assessment & Plan Note (Signed)
Chronic, not controlled. She is having panic attacks about 3-4 times per week. She is currently taking hydroxyzine as needed. She she has tried fluoxetine, lorazepam, and Xanax in the past as well.  Her PHQ-9 is a 9 and her GAD-7 is an 25.  She denies SI/HI.  We will have her start Lexapro 10 mg daily.  Discussed possible side effects.  Follow-up in 4 to 6 weeks.

## 2022-01-01 NOTE — Assessment & Plan Note (Signed)
She has been having constipation for the last 2 weeks.  We will have her start MiraLAX 17 g twice a day and make sure she is drinking plenty of fluids.  When she starts having regular bowel movements, she can cut back on this to daily as needed.  We will check TSH today.  Follow-up in 4 weeks.

## 2022-01-02 LAB — IRON,TIBC AND FERRITIN PANEL
%SAT: 4 % (calc) — ABNORMAL LOW (ref 16–45)
Ferritin: 3 ng/mL — ABNORMAL LOW (ref 16–154)
Iron: 21 ug/dL — ABNORMAL LOW (ref 40–190)
TIBC: 500 mcg/dL (calc) — ABNORMAL HIGH (ref 250–450)

## 2022-01-13 ENCOUNTER — Other Ambulatory Visit (HOSPITAL_COMMUNITY)
Admission: RE | Admit: 2022-01-13 | Discharge: 2022-01-13 | Disposition: A | Discharge status: Home / Self Care | Payer: Medicaid Other | Source: Ambulatory Visit | Attending: Obstetrics and Gynecology | Admitting: Obstetrics and Gynecology

## 2022-01-13 ENCOUNTER — Encounter: Payer: Self-pay | Admitting: Obstetrics and Gynecology

## 2022-01-13 ENCOUNTER — Ambulatory Visit (INDEPENDENT_AMBULATORY_CARE_PROVIDER_SITE_OTHER): Payer: Medicaid Other | Admitting: Obstetrics and Gynecology

## 2022-01-13 VITALS — BP 126/89 | HR 72 | Ht 66.0 in | Wt 200.0 lb

## 2022-01-13 DIAGNOSIS — N920 Excessive and frequent menstruation with regular cycle: Secondary | ICD-10-CM | POA: Diagnosis not present

## 2022-01-13 DIAGNOSIS — Z113 Encounter for screening for infections with a predominantly sexual mode of transmission: Secondary | ICD-10-CM

## 2022-01-13 DIAGNOSIS — Z01419 Encounter for gynecological examination (general) (routine) without abnormal findings: Secondary | ICD-10-CM | POA: Insufficient documentation

## 2022-01-13 NOTE — Progress Notes (Signed)
GYNECOLOGY ANNUAL PREVENTATIVE CARE ENCOUNTER NOTE  History:     Katie Powell is a 31 y.o. G48P1011 female here for a routine annual gynecologic exam.  Current complaints: heavy regular menses.   Denies  discharge, pelvic pain, problems with intercourse or other gynecologic concerns.  Pt has had heavy menses since her child was delivered.  She has had at least 2 blood transfusions 2/2 anemia.  Menstrual cycles are regular and last 5-6 days.  They are heavy the first 2-3 days.  She has tried depo provera and combined OCP in the past for treatment, but they have been ineffective.  Pt desires to retain fertility.   Gynecologic History Patient's last menstrual period was 01/05/2022. Contraception: OCP (estrogen/progesterone) Last Pap: 08/17/18. Results were: normal with negative HPV   Obstetric History OB History  Gravida Para Term Preterm AB Living  '2 1 1   1 1  '$ SAB IAB Ectopic Multiple Live Births  1 0   0 1    # Outcome Date GA Lbr Len/2nd Weight Sex Delivery Anes PTL Lv  2 Term 03/25/19 [id number]:28 / 00:19 7 lb 10.2 oz (3.465 kg) F Vag-Spont EPI  LIV  1 SAB 09/2016            Past Medical History:  Diagnosis Date   Anemia    Anxiety    Blood transfusion without reported diagnosis    Establishing care with new doctor, encounter for 03/09/2019    Past Surgical History:  Procedure Laterality Date   EYE SURGERY     FINGER SURGERY      Current Outpatient Medications on File Prior to Visit  Medication Sig Dispense Refill   APRI 0.15-30 MG-MCG tablet TAKE ONE TABLET BY MOUTH DAILY 28 tablet 11   escitalopram (LEXAPRO) 10 MG tablet Take 1 tablet (10 mg total) by mouth daily. 30 tablet 1   ferrous sulfate 325 (65 FE) MG tablet Take 1 tablet (325 mg total) by mouth 2 (two) times daily with a meal. (Patient taking differently: Take 325 mg by mouth daily.) 180 tablet 0   hydrOXYzine (ATARAX) 50 MG tablet Take 1 tablet (50 mg total) by mouth 3 (three) times daily as needed. 60  tablet 0   polyethylene glycol powder (GLYCOLAX/MIRALAX) 17 GM/SCOOP powder Take 17 g by mouth 2 (two) times daily as needed. 1020 g 1   acetaminophen (TYLENOL) 500 MG tablet Take 1 tablet (500 mg total) by mouth every 6 (six) hours as needed. (Patient taking differently: Take 1,000 mg by mouth every 6 (six) hours as needed for moderate pain.) 30 tablet 0   [DISCONTINUED] esomeprazole (NEXIUM) 40 MG capsule Take 1 capsule (40 mg total) by mouth daily. 30 capsule 3   Current Facility-Administered Medications on File Prior to Visit  Medication Dose Route Frequency Provider Last Rate Last Admin   0.9 %  sodium chloride infusion  500 mL Intravenous Once Nandigam, KVenia Minks MD        No Known Allergies  Social History:  reports that she has been smoking cigarettes. She has a 3.00 pack-year smoking history. She has never used smokeless tobacco. She reports that she does not currently use alcohol. She reports that she does not use drugs.  Family History  Adopted: Yes    The following portions of the patient's history were reviewed and updated as appropriate: allergies, current medications, past family history, past medical history, past social history, past surgical history and problem list.  Review of  Systems Pertinent items noted in HPI and remainder of comprehensive ROS otherwise negative.  Physical Exam:  BP 126/89   Pulse 72   Ht '5\' 6"'$  (1.676 m)   Wt 200 lb (90.7 kg)   LMP 01/05/2022   BMI 32.28 kg/m  CONSTITUTIONAL: Well-developed, well-nourished female in no acute distress.  HENT:  Normocephalic, atraumatic, External right and left ear normal. Oropharynx is clear and moist EYES: Conjunctivae and EOM are normal.  NECK: Normal range of motion, supple, no masses.  Normal thyroid.  SKIN: Skin is warm and dry. No rash noted. Not diaphoretic. No erythema. No pallor. MUSCULOSKELETAL: Normal range of motion. No tenderness.  No cyanosis, clubbing, or edema.  2+ distal pulses. NEUROLOGIC:  Alert and oriented to person, place, and time. Normal reflexes, muscle tone coordination.  PSYCHIATRIC: Normal mood and affect. Normal behavior. Normal judgment and thought content. CARDIOVASCULAR: Normal heart rate noted, regular rhythm RESPIRATORY: Clear to auscultation bilaterally. Effort and breath sounds normal, no problems with respiration noted. BREASTS: Symmetric in size. No masses, tenderness, skin changes, nipple drainage, or lymphadenopathy bilaterally. Performed in the presence of a chaperone. ABDOMEN: Soft, no distention noted.  No tenderness, rebound or guarding.  PELVIC: Normal appearing external genitalia and urethral meatus; normal appearing vaginal mucosa and cervix.  No abnormal discharge noted.  Pap smear obtained.  Normal uterine size, no other palpable masses, no uterine or adnexal tenderness.  Performed in the presence of a chaperone.   Assessment and Plan:    1. Women's annual routine gynecological examination Normal annual exam - Cytology - PAP( [place])  2. Routine screening for STI (sexually transmitted infection) Per pt request - Cervicovaginal ancillary only( [place])  3. Menorrhagia with regular cycle Recent CT scan did not show any major fibroids. Discussed treatment options including lysteda, progesterone IUD or changing current OCP. Pt desires trial of mirena.  Pt advised the effect will not be immediate and she will need to give the device 3-6 months before making decision on the effect. Pamphlet given.    Will follow up results of pap smear and manage accordingly. Routine preventative health maintenance measures emphasized. F/u in 2-3 weeks for IUD placement.  Please refer to After Visit Summary for other counseling recommendations.      Lynnda Shields, MD, [place] [facility], [place]

## 2022-01-13 NOTE — Progress Notes (Signed)
Pt states she has regular cycles but are heavy, lasting 5-6 days.  Pt is currently taking BC pills - pt states doesn't help with flow of cycle. Pt had cyst noted on recent scan - pt states no problems with this.   Pt is in need of AEX and would like today.

## 2022-01-14 LAB — CERVICOVAGINAL ANCILLARY ONLY
Chlamydia: NEGATIVE
Comment: NEGATIVE
Comment: NEGATIVE
Comment: NORMAL
Neisseria Gonorrhea: NEGATIVE
Trichomonas: NEGATIVE

## 2022-01-19 LAB — CYTOLOGY - PAP
Comment: NEGATIVE
Diagnosis: UNDETERMINED — AB
High risk HPV: POSITIVE — AB

## 2022-01-20 ENCOUNTER — Telehealth: Payer: Self-pay | Admitting: Emergency Medicine

## 2022-01-20 NOTE — Telephone Encounter (Signed)
TC to patient to discuss results. Colposcopy scheduled.

## 2022-01-20 NOTE — Telephone Encounter (Signed)
Attempted TC, LVM.

## 2022-01-28 NOTE — Progress Notes (Unsigned)
   Established Patient Office Visit  Subjective   Patient ID: Katie Powell, female    DOB: 03/12/1991  Age: 31 y.o. MRN: [id number]  No chief complaint on file.   HPI  Katie Powell is here to follow-up on anxiety, depression, and constipation.   Last visit she was started on lexapro '10mg'$  daily.  {History (Optional):23778}  ROS    Objective:     LMP 01/05/2022  {Vitals History (Optional):23777}  Physical Exam   No results found for any visits on 01/29/22.  {Labs (Optional):23779}  The ASCVD Risk score (Arnett DK, et al., 2019) failed to calculate for the following reasons:   The 2019 ASCVD risk score is only valid for ages 73 to 60    Assessment & Plan:   Problem List Items Addressed This Visit   None   No follow-ups on file.    Charyl Dancer, NP

## 2022-01-29 ENCOUNTER — Ambulatory Visit (INDEPENDENT_AMBULATORY_CARE_PROVIDER_SITE_OTHER): Payer: Medicaid Other | Admitting: Nurse Practitioner

## 2022-01-29 ENCOUNTER — Encounter: Payer: Self-pay | Admitting: Nurse Practitioner

## 2022-01-29 VITALS — BP 118/84 | HR 86 | Temp 99.1°F | Wt 204.4 lb

## 2022-01-29 DIAGNOSIS — Z23 Encounter for immunization: Secondary | ICD-10-CM

## 2022-01-29 DIAGNOSIS — K5900 Constipation, unspecified: Secondary | ICD-10-CM | POA: Diagnosis not present

## 2022-01-29 DIAGNOSIS — F32A Depression, unspecified: Secondary | ICD-10-CM

## 2022-01-29 DIAGNOSIS — F419 Anxiety disorder, unspecified: Secondary | ICD-10-CM

## 2022-01-29 MED ORDER — ESCITALOPRAM OXALATE 10 MG PO TABS: 10.0000 mg | ORAL_TABLET | Freq: Every day | ORAL | 1 refills | Status: DC

## 2022-01-29 MED ORDER — POLYETHYLENE GLYCOL 3350 17 GM/SCOOP PO POWD: 17.0000 g | Freq: Two times a day (BID) | ORAL | 1 refills | Status: DC | PRN

## 2022-01-29 NOTE — Assessment & Plan Note (Signed)
Constipation is improving with taking miralax daily. Continue miralax daily while taking iron supplement. Refill sent to the pharmacy.

## 2022-01-29 NOTE — Patient Instructions (Signed)
It was great to see you!  Keep up the great work!  You can start a vitamin B12 supplement 1,044mg daily.   Let's follow-up in 6 months, sooner if you have concerns.  If a referral was placed today, you will be contacted for an appointment. Please note that routine referrals can sometimes take up to 3-4 weeks to process. Please call our office if you haven't heard anything after this time frame.  Take care,  LVance Peper NP

## 2022-01-29 NOTE — Assessment & Plan Note (Addendum)
Chronic, improving. She states the lexapro is helping her symptoms and only had nausea when she first started the medication. Her PHQ 9 went from a 9 to a 2 and her GAD 7 went from an 11 to a 2. Continue lexapro '10mg'$  daily. Refill sent to the pharmacy. Follow-up in 6 months.

## 2022-03-18 ENCOUNTER — Ambulatory Visit (HOSPITAL_COMMUNITY): Admission: EM | Admit: 2022-03-18 | Payer: Medicaid Other | Source: Home / Self Care

## 2022-03-19 ENCOUNTER — Encounter (HOSPITAL_COMMUNITY): Payer: Self-pay

## 2022-03-19 ENCOUNTER — Ambulatory Visit (HOSPITAL_COMMUNITY)
Admission: EM | Admit: 2022-03-19 | Discharge: 2022-03-19 | Disposition: A | Discharge status: Home / Self Care | Payer: Medicaid Other | Attending: Emergency Medicine | Admitting: Emergency Medicine

## 2022-03-19 ENCOUNTER — Encounter: Payer: Self-pay | Admitting: Physician Assistant

## 2022-03-19 VITALS — BP 123/85 | HR 92 | Temp 99.0°F | Resp 15

## 2022-03-19 DIAGNOSIS — L259 Unspecified contact dermatitis, unspecified cause: Secondary | ICD-10-CM

## 2022-03-19 DIAGNOSIS — R238 Other skin changes: Secondary | ICD-10-CM

## 2022-03-19 MED ORDER — TRIAMCINOLONE ACETONIDE 0.1 % EX CREA: 1.0000 | TOPICAL_CREAM | Freq: Two times a day (BID) | CUTANEOUS | 0 refills | Status: DC

## 2022-03-19 NOTE — ED Triage Notes (Signed)
Pt c/o skin problem with dry and cracking skin on right 3rd finger for about a month. Can't get in with dermatologist until June

## 2022-03-19 NOTE — ED Provider Notes (Signed)
Katie Powell    CSN: [id number] Arrival date & time: 03/19/22  0920      History   Chief Complaint Chief Complaint  Patient presents with   appt 930   Skin Problem    HPI Katie Powell is a 31 y.o. female.  Presents with 1 month history of dry and cracked skin on the right hand Mostly on the middle finger Sometimes it is itchy, mostly just bothers her because of appearance Has been moisturizing and trying hydrocortisone cream occasionally  No pain, drainage, warmth No bites or scratches  She works as a Engineer, civil (consulting), wears gloves and washes hands frequently   Past Medical History:  Diagnosis Date   Anemia    Anxiety    Blood transfusion without reported diagnosis    Establishing care with new doctor, encounter for 03/09/2019    Patient Active Problem List   Diagnosis Date Noted   Menorrhagia 01/13/2022   Constipation 01/01/2022   Anxiety and depression 01/01/2022   Tobacco use 01/01/2022   B12 deficiency 10/21/2020   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 Authorizing Provider  triamcinolone cream (KENALOG) 0.1 % Apply 1 Application topically 2 (two) times daily. 03/19/22  Yes Ainsley Sanguinetti, Wells Guiles, PA-C  acetaminophen (TYLENOL) 500 MG tablet Take 1 tablet (500 mg total) by mouth every 6 (six) hours as needed. Patient taking differently: Take 1,000 mg by mouth every 6 (six) hours as  needed for moderate pain. 04/25/20   Domenic Moras, PA-C  escitalopram (LEXAPRO) 10 MG tablet Take 1 tablet (10 mg total) by mouth daily. 01/29/22   McElwee, Lauren A, NP  ferrous sulfate 325 (65 FE) MG tablet Take 1 tablet (325 mg total) by mouth 2 (two) times daily with a meal. Patient taking differently: Take 325 mg by mouth daily. 10/08/20   [name], NP  esomeprazole (NEXIUM) 40 MG capsule Take 1 capsule (40 mg total) by mouth daily. 01/09/20 01/29/20  [name], NP    Family History Family History  Adopted: Yes    Social History Social History   Tobacco Use   Smoking status: Every Day    Packs/day: 0.50    Years: 6.00    Total pack years: 3.00    Types: Cigarettes   Smokeless tobacco: Never  Vaping Use   Vaping Use: Never used  Substance Use Topics   Alcohol use: Not Currently   Drug use: No     Allergies   Patient has no known allergies.   Review of Systems Review of Systems  Per HPI  Physical Exam Triage Vital Signs ED Triage Vitals [03/19/22 0934]  Enc  Vitals Group     BP 123/85     Pulse Rate 92     Resp 15     Temp 99 F (37.2 C)     Temp Source Oral     SpO2 99 %     Weight      Height      Head Circumference      Peak Flow      Pain Score      Pain Loc      Pain Edu?      Excl. in [place]?    No data found.  Updated Vital Signs BP 123/85 (BP Location: Left Arm)   Pulse 92   Temp 99 F (37.2 C) (Oral)   Resp 15   LMP 03/05/2022   SpO2 99%    Physical Exam Vitals and nursing note reviewed.  Constitutional:      General: She is not in acute distress. HENT:     Mouth/Throat:     Mouth: Mucous membranes are moist.     Pharynx: Oropharynx is clear.  Cardiovascular:     Rate and Rhythm: Normal rate and regular rhythm.  Pulmonary:     Effort: Pulmonary effort is normal.  Musculoskeletal:        General: No tenderness.  Skin:    Capillary Refill: Capillary refill takes less than 2 seconds.     Findings: Rash present.      Comments: Patch of dry peeling skin dorsal middle finger, right hand. One spot on the right thumb. Does not appear infected. Cracked skin over knuckle   Neurological:     Mental Status: She is alert.      UC Treatments / Results  Labs (all labs ordered are listed, but only abnormal results are displayed) Labs Reviewed - No data to display  EKG  Radiology No results found.  Procedures Procedures (including critical care time)  Medications Ordered in UC Medications - No data to display  Initial Impression / Assessment and Plan / UC Course  I have reviewed the triage vital signs and the nursing notes.  Pertinent labs & imaging results that were available during my care of the patient were reviewed by me and considered in my medical decision making (see chart for details).  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  Also recommend daily emollient, especially at night, covered by glove to trap in moisture Return precautions discussed. Patient agrees to plan  Final Clinical Impressions(s) / UC Diagnoses   Final diagnoses:  Skin irritation  Contact dermatitis and eczema     Discharge Instructions      Apply the topical cream twice daily  Use moisturizer like aquaphor, cetaphil, cerave You can use overnight covered by glove to lock in moisture  Return with concerns or change in symptoms     ED Prescriptions     Medication Sig Dispense Auth. Provider   triamcinolone cream (KENALOG) 0.1 % Apply 1 Application topically 2 (two) times daily. 30 g Esteven Overfelt, Wells Guiles, PA-C      PDMP not reviewed this encounter.   Ajla Mcgeachy, Vernice Jefferson 03/19/22 1122

## 2022-03-19 NOTE — Discharge Instructions (Signed)
Apply the topical cream twice daily  Use moisturizer like aquaphor, cetaphil, cerave You can use overnight covered by glove to lock in moisture  Return with concerns or change in symptoms

## 2022-03-30 ENCOUNTER — Ambulatory Visit (INDEPENDENT_AMBULATORY_CARE_PROVIDER_SITE_OTHER): Payer: Medicaid Other | Admitting: Obstetrics and Gynecology

## 2022-03-30 ENCOUNTER — Other Ambulatory Visit (HOSPITAL_COMMUNITY)
Admission: RE | Admit: 2022-03-30 | Discharge: 2022-03-30 | Disposition: A | Discharge status: Home / Self Care | Payer: Medicaid Other | Source: Ambulatory Visit | Attending: Obstetrics and Gynecology | Admitting: Obstetrics and Gynecology

## 2022-03-30 VITALS — BP 122/82 | HR 75 | Wt 198.0 lb

## 2022-03-30 DIAGNOSIS — R87810 Cervical high risk human papillomavirus (HPV) DNA test positive: Secondary | ICD-10-CM | POA: Insufficient documentation

## 2022-03-30 DIAGNOSIS — R87610 Atypical squamous cells of undetermined significance on cytologic smear of cervix (ASC-US): Secondary | ICD-10-CM

## 2022-03-30 LAB — POCT URINE PREGNANCY: Preg Test, Ur: NEGATIVE

## 2022-03-30 NOTE — Progress Notes (Signed)
Patient given informed consent, signed copy in the chart, time out was performed.  UPT negative.  Chart reviewed with ASCUS, positive HPV noted.  Placed in lithotomy position. Cervix viewed with speculum and colposcope after application of acetic acid.   Colposcopy adequate?  yes Acetowhite lesions? Yes, primarily at 11-12 o'clock Punctation?no Mosaicism?   no Abnormal vasculature?  Slightly tortuous vessels seen at 12 o'clock site Biopsies? Yes a t 12 o'clock ECC? no  COMMENTS: Patient was given post procedure instructions.  She will return in 3 weeks for results and possible IUD placement.  Griffin Basil, MD

## 2022-03-31 LAB — SURGICAL PATHOLOGY

## 2022-05-05 ENCOUNTER — Telehealth: Payer: Self-pay | Admitting: Nurse Practitioner

## 2022-05-05 NOTE — Telephone Encounter (Signed)
Caller Name: Eniyah Eastmond Call back phone #: [phone]  Reason for Call: Pt is dealing with rashes and peeling skin on hands and surrounding area. Can we see her any earlier than Thursday?

## 2022-05-05 NOTE — Telephone Encounter (Signed)
Per Lauren patient can come in tomorrow with a double book. Please call patient to schedule appointment.

## 2022-05-05 NOTE — Telephone Encounter (Signed)
Please advise message below  °

## 2022-05-06 ENCOUNTER — Encounter: Payer: Self-pay | Admitting: Nurse Practitioner

## 2022-05-06 ENCOUNTER — Ambulatory Visit (INDEPENDENT_AMBULATORY_CARE_PROVIDER_SITE_OTHER): Payer: Medicaid Other | Admitting: Nurse Practitioner

## 2022-05-06 VITALS — BP 122/80 | HR 90 | Temp 97.8°F | Ht 66.0 in | Wt 201.2 lb

## 2022-05-06 DIAGNOSIS — Z23 Encounter for immunization: Secondary | ICD-10-CM

## 2022-05-06 DIAGNOSIS — D500 Iron deficiency anemia secondary to blood loss (chronic): Secondary | ICD-10-CM

## 2022-05-06 DIAGNOSIS — L301 Dyshidrosis [pompholyx]: Secondary | ICD-10-CM | POA: Diagnosis not present

## 2022-05-06 LAB — CBC WITH DIFFERENTIAL/PLATELET
Basophils Absolute: 0 10*3/uL (ref 0.0–0.1)
Basophils Relative: 0.6 % (ref 0.0–3.0)
Eosinophils Absolute: 0.1 10*3/uL (ref 0.0–0.7)
Eosinophils Relative: 2.7 % (ref 0.0–5.0)
HCT: 27.2 % — ABNORMAL LOW (ref 36.0–46.0)
Hemoglobin: 8.5 g/dL — ABNORMAL LOW (ref 12.0–15.0)
Lymphocytes Relative: 28.1 % (ref 12.0–46.0)
Lymphs Abs: 1.4 10*3/uL (ref 0.7–4.0)
MCHC: 31.2 g/dL (ref 30.0–36.0)
MCV: 68.1 fl — ABNORMAL LOW (ref 78.0–100.0)
Monocytes Absolute: 0.4 10*3/uL (ref 0.1–1.0)
Monocytes Relative: 9 % (ref 3.0–12.0)
Neutro Abs: 2.9 10*3/uL (ref 1.4–7.7)
Neutrophils Relative %: 59.6 % (ref 43.0–77.0)
Platelets: 394 10*3/uL (ref 150.0–400.0)
RBC: 4 Mil/uL (ref 3.87–5.11)
RDW: 17.2 % — ABNORMAL HIGH (ref 11.5–15.5)
WBC: 4.8 10*3/uL (ref 4.0–10.5)

## 2022-05-06 LAB — VITAMIN B12: Vitamin B-12: 239 pg/mL (ref 211–911)

## 2022-05-06 MED ORDER — BETAMETHASONE DIPROPIONATE 0.05 % EX CREA: TOPICAL_CREAM | Freq: Two times a day (BID) | CUTANEOUS | 1 refills | Status: DC

## 2022-05-06 NOTE — Assessment & Plan Note (Signed)
Symptoms ongoing even with triamcinolone cream.  Will have her start betamethasone cream twice a day to her irritated skin areas.  Encouraged her to also continue to use Aquaphor lotion.  Will place referral to dermatology.  She denies symptoms of Raynaud's.  Dr. Gena Fray evaluated the patient as well.  Follow-up in 4 weeks.

## 2022-05-06 NOTE — Patient Instructions (Signed)
It was great to see you!  Start betamethasone cream twice a day  Continue using aquaphor several times per day  We are checking your labs today and will let you know the results via mychart/phone.   Let's follow-up in 4 weeks, sooner if you have concerns.  If a referral was placed today, you will be contacted for an appointment. Please note that routine referrals can sometimes take up to 3-4 weeks to process. Please call our office if you haven't heard anything after this time frame.  Take care,  Vance Peper, NP

## 2022-05-06 NOTE — Progress Notes (Signed)
Acute Office Visit  Subjective:     Patient ID: Katie Powell, female    DOB: [birth year], 31 y.o.   MRN: [id number]  Chief Complaint  Patient presents with   Acute Visit    C/o painful rash on Rt hand x 7 months  HPV vaccine today     HPI Patient is in today for a painful and itchy rash and skin discoloration to her right hand that started about 7 months ago. She went to urgent care on 03/19/22 and was given triamcinolone cream to use. She states that this has not helped and it is getting worse. She has dark patches of skin to her thumb and index finger tips. She has also been using aquarphor and hydrocortisone cream to the areas. 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 She denies sensitivity to cold, new medications, soap, and laundry detergent.   She also would like to get her iron levels checked today. She states that she has been craving corn starch. She has not been taking her iron supplement due to constipation.   ROS See pertinent positives and negatives per HPI.     Objective:    BP 122/80 (BP Location: Right Arm, Patient Position: Sitting, Cuff Size: Normal)   Pulse 90   Temp 97.8 F (36.6 C) (Temporal)   Ht '5\' 6"'$  (1.676 m)   Wt 201 lb 3.2 oz (91.3 kg)   SpO2 99%   BMI 32.47 kg/m    Physical Exam Vitals and nursing note reviewed.  Constitutional:      General: She is not in acute distress.    Appearance: Normal appearance.  HENT:     Head: Normocephalic.  Eyes:     Conjunctiva/sclera: Conjunctivae normal.  Pulmonary:     Effort: Pulmonary effort is normal.  Musculoskeletal:     Cervical back: Normal range of motion.  Skin:    General: Skin is warm.     Findings: Rash present.     Comments: Dark skin discoloration with peeling to right index and middle finger. Dark spots to tip of thumb and index finger  Neurological:     General: No focal deficit present.     Mental Status: She is alert and oriented to person, place, and time.   Psychiatric:        Mood and Affect: Mood normal.        Behavior: Behavior normal.        Thought Content: Thought content normal.        Judgment: Judgment normal.     Results for orders placed or performed in visit on 05/06/22  CBC with Differential/Platelet  Result Value Ref Range   WBC 4.8 4.0 - 10.5 K/uL   RBC 4.00 3.87 - 5.11 Mil/uL   Hemoglobin 8.5 (L) 12.0 - 15.0 g/dL   HCT 27.2 (L) 36.0 - 46.0 %   MCV 68.1 (L) 78.0 - 100.0 fl   MCHC 31.2 30.0 - 36.0 g/dL   RDW 17.2 (H) 11.5 - 15.5 %   Platelets 394.0 150.0 - 400.0 K/uL   Neutrophils Relative % 59.6 43.0 - 77.0 %   Lymphocytes Relative 28.1 12.0 - 46.0 %   Monocytes Relative 9.0 3.0 - 12.0 %   Eosinophils Relative 2.7 0.0 - 5.0 %   Basophils Relative 0.6 0.0 - 3.0 %   Neutro Abs 2.9 1.4 - 7.7 K/uL   Lymphs Abs 1.4 0.7 - 4.0 K/uL   Monocytes  Absolute 0.4 0.1 - 1.0 K/uL   Eosinophils Absolute 0.1 0.0 - 0.7 K/uL   Basophils Absolute 0.0 0.0 - 0.1 K/uL  Vitamin B12  Result Value Ref Range   Vitamin B-12 239 211 - 911 pg/mL        Assessment & Plan:   Problem List Items Addressed This Visit       Musculoskeletal and Integument   Dyshidrotic eczema - Primary    Symptoms ongoing even with triamcinolone cream.  Will have her start betamethasone cream twice a day to her irritated skin areas.  Encouraged her to also continue to use Aquaphor lotion.  Will place referral to dermatology.  She denies symptoms of Raynaud's.  Dr. Gena Fray evaluated the patient as well.  Follow-up in 4 weeks.      Relevant Orders   Ambulatory referral to Dermatology     Other   Iron deficiency anemia due to chronic blood loss (Chronic)    Will check a CBC, vitamin B12 and iron panel today.  She has not been taking her iron supplement due to constipation.      Relevant Orders   CBC with Differential/Platelet (Completed)   Iron, TIBC and Ferritin Panel   Vitamin B12 (Completed)   Other Visit Diagnoses     Need for HPV vaccination        3rd HPV vaccine given today   Relevant Orders   HPV 9-valent vaccine,Recombinat (Completed)       Meds ordered this encounter  Medications   betamethasone dipropionate 0.05 % cream    Sig: Apply topically 2 (two) times daily.    Dispense:  30 g    Refill:  1    Return in about 4 weeks (around 06/03/2022) for eczema.  Charyl Dancer, NP

## 2022-05-06 NOTE — Assessment & Plan Note (Addendum)
Will check a CBC, vitamin B12 and iron panel today.  She has not been taking her iron supplement due to constipation.

## 2022-05-07 ENCOUNTER — Encounter: Payer: Self-pay | Admitting: Nurse Practitioner

## 2022-05-07 DIAGNOSIS — D500 Iron deficiency anemia secondary to blood loss (chronic): Secondary | ICD-10-CM

## 2022-05-07 LAB — IRON,TIBC AND FERRITIN PANEL
%SAT: 4 % (calc) — ABNORMAL LOW (ref 16–45)
Ferritin: 1 ng/mL — ABNORMAL LOW (ref 16–154)
Iron: 15 ug/dL — ABNORMAL LOW (ref 40–190)
TIBC: 389 mcg/dL (calc) (ref 250–450)

## 2022-05-19 ENCOUNTER — Ambulatory Visit (INDEPENDENT_AMBULATORY_CARE_PROVIDER_SITE_OTHER): Payer: Medicaid Other | Admitting: Obstetrics and Gynecology

## 2022-05-19 ENCOUNTER — Encounter: Payer: Self-pay | Admitting: Nurse Practitioner

## 2022-05-19 VITALS — BP 117/81 | HR 80 | Wt 201.0 lb

## 2022-05-19 DIAGNOSIS — Z30430 Encounter for insertion of intrauterine contraceptive device: Secondary | ICD-10-CM | POA: Diagnosis not present

## 2022-05-19 LAB — POCT URINE PREGNANCY: Preg Test, Ur: NEGATIVE

## 2022-05-19 MED ORDER — LEVONORGESTREL 20 MCG/DAY IU IUD
1.0000 | INTRAUTERINE_SYSTEM | Freq: Once | INTRAUTERINE | Status: AC
  Administered 2022-05-19: 1 via INTRAUTERINE

## 2022-05-19 NOTE — Progress Notes (Signed)
    GYNECOLOGY OFFICE PROCEDURE NOTE  Katie Powell is a 32 y.o. G2P1011 here for [place] IUD insertion. No GYN concerns.  Last pap smear was on 01/13/22 and was ASCUS with HPV, colpo showed CIN 1.  IUD Insertion Procedure Note Patient identified, informed consent performed, consent signed.   Discussed risks of irregular bleeding, cramping, infection, malpositioning or misplacement of the IUD outside the uterus which may require further procedure such as laparoscopy. Also discussed >99% contraception efficacy, increased risk of ectopic pregnancy with failure of method.  Time out was performed.  Urine pregnancy test negative.  Speculum placed in the vagina.  Cervix visualized.  Cleaned with Betadine x 2.  Grasped anteriorly with a single tooth tenaculum.  Uterus sounded to 8 cm.  Mirena IUD placed per manufacturer's recommendations.  Strings trimmed to 3 cm. Tenaculum was removed, good hemostasis noted after pressure applied.  Pt offered  ibuprofen post procedure.  Patient tolerated procedure well.   Patient was given post-procedure instructions.  She was advised to have backup contraception for one week.  Patient was also asked to check IUD strings periodically and follow up in 4 weeks for IUD check.  Pt also advised progesterone IUD may decrease or stop menstrual bleeding, but this process may take 6-8 months.   Lynnda Shields, MD, [place] for [facility], [place]

## 2022-05-19 NOTE — Addendum Note (Signed)
Addended by: Lewie Loron D on: 05/19/2022 03:33 PM   Modules accepted: Orders

## 2022-05-19 NOTE — Progress Notes (Signed)
Pt is in office for IUD insertion for cycle control. LMP 04/29/22 Last intercourse Oct 2023.

## 2022-05-28 ENCOUNTER — Telehealth (HOSPITAL_COMMUNITY): Payer: Self-pay

## 2022-05-29 ENCOUNTER — Encounter: Payer: Self-pay | Admitting: Hematology and Oncology

## 2022-05-29 ENCOUNTER — Inpatient Hospital Stay (HOSPITAL_BASED_OUTPATIENT_CLINIC_OR_DEPARTMENT_OTHER): Payer: Medicaid Other | Admitting: Hematology and Oncology

## 2022-05-29 ENCOUNTER — Other Ambulatory Visit: Payer: Self-pay

## 2022-05-29 ENCOUNTER — Inpatient Hospital Stay: Payer: Medicaid Other | Attending: Hematology and Oncology

## 2022-05-29 VITALS — BP 117/78 | HR 91 | Temp 99.5°F | Resp 18 | Ht 66.0 in | Wt 203.0 lb

## 2022-05-29 DIAGNOSIS — Z79899 Other long term (current) drug therapy: Secondary | ICD-10-CM | POA: Insufficient documentation

## 2022-05-29 DIAGNOSIS — D649 Anemia, unspecified: Secondary | ICD-10-CM | POA: Diagnosis not present

## 2022-05-29 DIAGNOSIS — D539 Nutritional anemia, unspecified: Secondary | ICD-10-CM

## 2022-05-29 DIAGNOSIS — K5900 Constipation, unspecified: Secondary | ICD-10-CM | POA: Diagnosis not present

## 2022-05-29 DIAGNOSIS — N920 Excessive and frequent menstruation with regular cycle: Secondary | ICD-10-CM

## 2022-05-29 DIAGNOSIS — D500 Iron deficiency anemia secondary to blood loss (chronic): Secondary | ICD-10-CM | POA: Insufficient documentation

## 2022-05-29 DIAGNOSIS — F17210 Nicotine dependence, cigarettes, uncomplicated: Secondary | ICD-10-CM | POA: Insufficient documentation

## 2022-05-29 DIAGNOSIS — E538 Deficiency of other specified B group vitamins: Secondary | ICD-10-CM

## 2022-05-29 LAB — CBC WITH DIFFERENTIAL (CANCER CENTER ONLY)
Abs Immature Granulocytes: 0.01 10*3/uL (ref 0.00–0.07)
Basophils Absolute: 0 10*3/uL (ref 0.0–0.1)
Basophils Relative: 1 %
Eosinophils Absolute: 0.1 10*3/uL (ref 0.0–0.5)
Eosinophils Relative: 3 %
HCT: 28 % — ABNORMAL LOW (ref 36.0–46.0)
Hemoglobin: 8.4 g/dL — ABNORMAL LOW (ref 12.0–15.0)
Immature Granulocytes: 0 %
Lymphocytes Relative: 38 %
Lymphs Abs: 1.8 10*3/uL (ref 0.7–4.0)
MCH: 20.4 pg — ABNORMAL LOW (ref 26.0–34.0)
MCHC: 30 g/dL (ref 30.0–36.0)
MCV: 68 fL — ABNORMAL LOW (ref 80.0–100.0)
Monocytes Absolute: 0.4 10*3/uL (ref 0.1–1.0)
Monocytes Relative: 9 %
Neutro Abs: 2.3 10*3/uL (ref 1.7–7.7)
Neutrophils Relative %: 49 %
Platelet Count: 346 10*3/uL (ref 150–400)
RBC: 4.12 MIL/uL (ref 3.87–5.11)
RDW: 16.7 % — ABNORMAL HIGH (ref 11.5–15.5)
WBC Count: 4.8 10*3/uL (ref 4.0–10.5)
nRBC: 0 % (ref 0.0–0.2)

## 2022-05-29 LAB — CMP (CANCER CENTER ONLY)
ALT: 7 U/L (ref 0–44)
AST: 12 U/L — ABNORMAL LOW (ref 15–41)
Albumin: 3.9 g/dL (ref 3.5–5.0)
Alkaline Phosphatase: 60 U/L (ref 38–126)
Anion gap: 7 (ref 5–15)
BUN: 10 mg/dL (ref 6–20)
CO2: 23 mmol/L (ref 22–32)
Calcium: 8.9 mg/dL (ref 8.9–10.3)
Chloride: 107 mmol/L (ref 98–111)
Creatinine: 0.75 mg/dL (ref 0.44–1.00)
GFR, Estimated: >60 mL/min (ref >60)
Glucose, Bld: 80 mg/dL (ref 70–99)
Potassium: 3.8 mmol/L (ref 3.5–5.1)
Sodium: 137 mmol/L (ref 135–145)
Total Bilirubin: 0.6 mg/dL (ref 0.3–1.2)
Total Protein: 7.4 g/dL (ref 6.5–8.1)

## 2022-05-29 LAB — SAMPLE TO BLOOD BANK

## 2022-05-29 LAB — IRON AND IRON BINDING CAPACITY (CC-WL,HP ONLY)
Iron: 12 ug/dL — ABNORMAL LOW (ref 28–170)
Saturation Ratios: 3 % — ABNORMAL LOW (ref 10.4–31.8)
TIBC: 448 ug/dL (ref 250–450)
UIBC: 436 ug/dL (ref 148–442)

## 2022-05-29 LAB — FERRITIN: Ferritin: 4 ng/mL — ABNORMAL LOW (ref 11–307)

## 2022-05-29 MED ORDER — FERROUS SULFATE 325 (65 FE) MG PO TABS: 325.0000 mg | ORAL_TABLET | Freq: Every day | ORAL | 0 refills | Status: AC

## 2022-05-29 NOTE — Assessment & Plan Note (Signed)
She is not taking vitamin B12 supplement I recommend vitamin B12 injection on the days that she return to get intravenous iron Feraheme and I plan to recheck vitamin B12 level in her future visit I also recommend daily multivitamin/prenatal vitamin supplement

## 2022-05-29 NOTE — Assessment & Plan Note (Signed)
For reason unknown, the patient is scheduled to see me Over the years, she has intermittent iron Feraheme infusion She is profoundly anemic and iron deficient despite taking oral iron supplement daily Recommend intravenous iron infusion and recommend she switch her iron supplement to nighttime The most likely cause of her anemia is due to chronic blood loss/malabsorption syndrome. We discussed some of the risks, benefits, and alternatives of intravenous iron infusions. The patient is symptomatic from anemia and the iron level is critically low. She tolerated oral iron supplement poorly and desires to achieved higher levels of iron faster for adequate hematopoesis. Some of the side-effects to be expected including risks of infusion reactions, phlebitis, headaches, nausea and fatigue.  The patient is willing to proceed. Patient education material was dispensed.  Goal is to keep ferritin level greater than 50 and resolution of anemia I plan to see her again in about 2 months

## 2022-05-29 NOTE — Assessment & Plan Note (Signed)
She had recent placement of IUD I am hopeful that would help control her menorrhagia I would defer to her gynecologist for management

## 2022-05-29 NOTE — Progress Notes (Signed)
[place] FOLLOW-UP progress notes  Patient Care Team: Charyl Dancer, NP as PCP - General (Internal Medicine)  CHIEF COMPLAINTS/PURPOSE OF VISIT:  Multifactorial anemia, combination of iron deficiency and B12 deficiency, history of menorrhagia  HISTORY OF PRESENTING ILLNESS:  Katie Powell 32 y.o. female was transferred to my care  She was last seen in 2022 I have reviewed her records The patient has significant history of anemia requiring blood transfusion in 2018 The cause of her anemia is related to heavy menorrhagia Approximately a week ago, she had Mirena IUD placed Prior to that, she have heavy menstruation lasting 7 days once a month with passage of large amount of clots She had EGD performed several years ago which show no evidence of upper GI bleed She has been taking oral iron supplement twice a day for some time but tolerated that poorly and recently switched to once a day She usually take it with an empty stomach in the morning She did not tolerate iron well due to constipation When she is iron deficient, she craves for cornstarch She does not follow a specific diet She denies taking NSAID The patient denies any recent signs or symptoms of bleeding such as spontaneous epistaxis, hematuria or hematochezia. She have received multiple doses of intravenous iron Feraheme in the past with improvement She tolerated that well without side effects Currently, with severe anemia, she complained of dizziness, lightheadedness and shortness of breath on exertion  MEDICAL HISTORY:  Past Medical History:  Diagnosis Date   Anemia    Anxiety    Blood transfusion without reported diagnosis    Establishing care with new doctor, encounter for 03/09/2019    SURGICAL HISTORY: Past Surgical History:  Procedure Laterality Date   EYE SURGERY     FINGER SURGERY      SOCIAL HISTORY: Social History   Socioeconomic History   Marital status: Single    Spouse name:  Not on file   Number of children: Not on file   Years of education: Not on file   Highest education level: Not on file  Occupational History   Not on file  Tobacco Use   Smoking status: Every Day    Packs/day: 0.50    Years: 6.00    Total pack years: 3.00    Types: Cigarettes   Smokeless tobacco: Never  Vaping Use   Vaping Use: Never used  Substance and Sexual Activity   Alcohol use: Not Currently   Drug use: No   Sexual activity: Yes    Birth control/protection: Pill  Other Topics Concern   Not on file  Social History Narrative   Not on file   Social Determinants of Health   Financial Resource Strain: Not on file  Food Insecurity: Not on file  Transportation Needs: Not on file  Physical Activity: Not on file  Stress: Not on file  Social Connections: Not on file  Intimate Partner Violence: Not on file    FAMILY HISTORY: Family History  Adopted: Yes    ALLERGIES:  has No Known Allergies.  MEDICATIONS:  Current Outpatient Medications  Medication Sig Dispense Refill   Prenatal Vit-Fe Fumarate-FA (MULTIVITAMIN-PRENATAL) 27-0.8 MG TABS tablet Take 1 tablet by mouth daily at 12 noon.     acetaminophen (TYLENOL) 500 MG tablet Take 1 tablet (500 mg total) by mouth every 6 (six) hours as needed. 30 tablet 0   betamethasone dipropionate 0.05 % cream Apply topically 2 (two) times daily. 30 g 1  escitalopram (LEXAPRO) 10 MG tablet Take 1 tablet (10 mg total) by mouth daily. 90 tablet 1   ferrous sulfate 325 (65 FE) MG tablet Take 1 tablet (325 mg total) by mouth daily with breakfast. 180 tablet 0   triamcinolone cream (KENALOG) 0.1 % Apply 1 Application topically 2 (two) times daily. 30 g 0   Current Facility-Administered Medications  Medication Dose Route Frequency Provider Last Rate Last Admin   0.9 %  sodium chloride infusion  500 mL Intravenous Once Nandigam, Venia Minks, MD        REVIEW OF SYSTEMS:   Constitutional: Denies fevers, chills or abnormal night  sweats Eyes: Denies blurriness of vision, double vision or watery eyes Ears, nose, mouth, throat, and face: Denies mucositis or sore throat Gastrointestinal:  Denies nausea, heartburn or change in bowel habits Skin: Denies abnormal skin rashes Lymphatics: Denies new lymphadenopathy or easy bruising Neurological:Denies numbness, tingling or new weaknesses Behavioral/Psych: Mood is stable, no new changes  All other systems were reviewed with the patient and are negative.  PHYSICAL EXAMINATION: ECOG PERFORMANCE STATUS: 1 - Symptomatic but completely ambulatory  Vitals:   05/29/22 1252  BP: 117/78  Pulse: 91  Resp: 18  Temp: 99.5 F (37.5 C)  SpO2: 100%   Filed Weights   05/29/22 1252  Weight: 203 lb (92.1 kg)    GENERAL:alert, no distress and comfortable SKIN: skin color, texture, turgor are normal, no rashes or significant lesions EYES: normal, conjunctiva are pink and non-injected, sclera clear OROPHARYNX:no exudate, normal lips, buccal mucosa, and tongue  NECK: supple, thyroid normal size, non-tender, without nodularity LYMPH:  no palpable lymphadenopathy in the cervical, axillary or inguinal LUNGS: clear to auscultation and percussion with normal breathing effort HEART: regular rate & rhythm and no murmurs without lower extremity edema ABDOMEN:abdomen soft, non-tender and normal bowel sounds Musculoskeletal:no cyanosis of digits and no clubbing  PSYCH: alert & oriented x 3 with fluent speech NEURO: no focal motor/sensory deficits  LABORATORY DATA:  I have reviewed the data as listed Lab Results  Component Value Date   WBC 4.8 05/29/2022   HGB 8.4 (L) 05/29/2022   HCT 28.0 (L) 05/29/2022   MCV 68.0 (L) 05/29/2022   PLT 346 05/29/2022   Recent Labs    09/19/21 0130 12/19/21 2047 01/01/22 1057 05/29/22 1234  NA 137 138 137 137  K 3.8 3.3* 3.5 3.8  CL 111 109 104 107  CO2 20* 20* 25 23  GLUCOSE 102* 123* 69* 80  BUN '11 13 11 10  '$ CREATININE 0.63 0.67 0.76  0.75  CALCIUM 8.7* 9.0 9.3 8.9  GFRNONAA >60 >60  --  >60  PROT 7.9 8.0  --  7.4  ALBUMIN 3.6 3.8  --  3.9  AST 15 13*  --  12*  ALT 10 10  --  7  ALKPHOS 47 43  --  60  BILITOT 0.6 0.6  --  0.6    ASSESSMENT & PLAN:  Iron deficiency anemia due to chronic blood loss For reason unknown, the patient is scheduled to see me Over the years, she has intermittent iron Feraheme infusion She is profoundly anemic and iron deficient despite taking oral iron supplement daily Recommend intravenous iron infusion and recommend she switch her iron supplement to nighttime The most likely cause of her anemia is due to chronic blood loss/malabsorption syndrome. We discussed some of the risks, benefits, and alternatives of intravenous iron infusions. The patient is symptomatic from anemia and the iron level is  critically low. She tolerated oral iron supplement poorly and desires to achieved higher levels of iron faster for adequate hematopoesis. Some of the side-effects to be expected including risks of infusion reactions, phlebitis, headaches, nausea and fatigue.  The patient is willing to proceed. Patient education material was dispensed.  Goal is to keep ferritin level greater than 50 and resolution of anemia I plan to see her again in about 2 months   B12 deficiency She is not taking vitamin B12 supplement I recommend vitamin B12 injection on the days that she return to get intravenous iron Feraheme and I plan to recheck vitamin B12 level in her future visit I also recommend daily multivitamin/prenatal vitamin supplement  Menorrhagia She had recent placement of IUD I am hopeful that would help control her menorrhagia I would defer to her gynecologist for management  Orders Placed This Encounter  Procedures   Iron and Iron Binding Capacity ([facility] only)    Standing Status:   Future    Standing Expiration Date:   05/30/2023   Ferritin    Standing Status:   Future    Standing Expiration Date:    05/29/2023   CBC with Differential ([place] Only)    Standing Status:   Future    Standing Expiration Date:   05/30/2023   Vitamin B12    Standing Status:   Future    Standing Expiration Date:   05/30/2023   Sedimentation rate    Standing Status:   Future    Standing Expiration Date:   05/30/2023    All questions were answered. The patient knows to call the clinic with any problems, questions or concerns. The total time spent in the appointment was 40 minutes encounter with patients including review of chart and various tests results, discussions about plan of care and coordination of care plan   Heath Lark, MD 05/29/2022 1:53 PM

## 2022-06-03 ENCOUNTER — Ambulatory Visit: Payer: Medicaid Other | Admitting: Nurse Practitioner
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
## 2022-06-08 ENCOUNTER — Inpatient Hospital Stay: Payer: Medicaid Other

## 2022-06-08 ENCOUNTER — Other Ambulatory Visit: Payer: Self-pay

## 2022-06-08 VITALS — BP 108/78 | HR 66 | Temp 98.7°F | Resp 18

## 2022-06-08 DIAGNOSIS — D500 Iron deficiency anemia secondary to blood loss (chronic): Secondary | ICD-10-CM

## 2022-06-08 MED ORDER — SODIUM CHLORIDE 0.9 % IV SOLN
510.0000 mg | Freq: Once | INTRAVENOUS | Status: AC
  Administered 2022-06-08: 510 mg via INTRAVENOUS
  Filled 2022-06-08: qty 17

## 2022-06-08 MED ORDER — SODIUM CHLORIDE 0.9 % IV SOLN: Freq: Once | INTRAVENOUS | Status: AC

## 2022-06-08 MED ORDER — CYANOCOBALAMIN 1000 MCG/ML IJ SOLN
1000.0000 ug | INTRAMUSCULAR | Status: DC
  Administered 2022-06-08: 1000 ug via INTRAMUSCULAR
  Filled 2022-06-08: qty 1

## 2022-06-08 NOTE — Patient Instructions (Signed)

## 2022-06-15 ENCOUNTER — Inpatient Hospital Stay: Payer: Medicaid Other | Attending: Hematology and Oncology

## 2022-06-15 ENCOUNTER — Other Ambulatory Visit: Payer: Self-pay

## 2022-06-15 VITALS — BP 119/85 | HR 70 | Temp 98.7°F | Resp 18

## 2022-06-15 DIAGNOSIS — Z79899 Other long term (current) drug therapy: Secondary | ICD-10-CM | POA: Diagnosis not present

## 2022-06-15 DIAGNOSIS — N920 Excessive and frequent menstruation with regular cycle: Secondary | ICD-10-CM | POA: Insufficient documentation

## 2022-06-15 DIAGNOSIS — D500 Iron deficiency anemia secondary to blood loss (chronic): Secondary | ICD-10-CM | POA: Diagnosis not present

## 2022-06-15 DIAGNOSIS — E538 Deficiency of other specified B group vitamins: Secondary | ICD-10-CM | POA: Diagnosis not present

## 2022-06-15 DIAGNOSIS — F17210 Nicotine dependence, cigarettes, uncomplicated: Secondary | ICD-10-CM | POA: Diagnosis not present

## 2022-06-15 MED ORDER — SODIUM CHLORIDE 0.9 % IV SOLN: INTRAVENOUS | Status: DC

## 2022-06-15 MED ORDER — CYANOCOBALAMIN 1000 MCG/ML IJ SOLN
1000.0000 ug | INTRAMUSCULAR | Status: DC
  Administered 2022-06-15: 1000 ug via INTRAMUSCULAR
  Filled 2022-06-15: qty 1

## 2022-06-15 MED ORDER — SODIUM CHLORIDE 0.9 % IV SOLN
510.0000 mg | Freq: Once | INTRAVENOUS | Status: AC
  Administered 2022-06-15: 510 mg via INTRAVENOUS
  Filled 2022-06-15: qty 510

## 2022-06-15 MED ORDER — SODIUM CHLORIDE 0.9 % IV SOLN: Freq: Once | INTRAVENOUS | Status: DC

## 2022-06-15 NOTE — Patient Instructions (Signed)

## 2022-06-25 ENCOUNTER — Encounter: Payer: Self-pay | Admitting: Obstetrics and Gynecology

## 2022-06-25 ENCOUNTER — Ambulatory Visit (INDEPENDENT_AMBULATORY_CARE_PROVIDER_SITE_OTHER): Payer: Medicaid Other | Admitting: Obstetrics and Gynecology

## 2022-06-25 VITALS — BP 116/85 | HR 88 | Ht 66.0 in | Wt 202.0 lb

## 2022-06-25 DIAGNOSIS — Z30431 Encounter for routine checking of intrauterine contraceptive device: Secondary | ICD-10-CM | POA: Diagnosis not present

## 2022-06-25 NOTE — Progress Notes (Signed)
32 y.o GYN follow up of IUD Insertion.  Reports no concerns today.

## 2022-06-25 NOTE — Progress Notes (Signed)
  CC: IUD , string check Subjective:    Patient ID: Katie Powell, female    DOB: [birth year], 32 y.o.   MRN: [id number]  HPI Pt seen for IUD check.  No major issues.  Pt noted one incident of post coital bleeding,otherwise normal.   Review of Systems     Objective:   Physical Exam Vitals:   06/25/22 0855  BP: 116/85  Pulse: 88  SSE:   IUD strings easily seen, cvx normal       Assessment & Plan:   1. IUD check up Normal IUD placement, follow up 10/24 for annual exam and repap    Griffin Basil, MD Faculty Attending, [facility]

## 2022-07-08 ENCOUNTER — Telehealth: Payer: Self-pay | Admitting: Hematology and Oncology

## 2022-07-08 NOTE — Telephone Encounter (Signed)
Patient aware of appointment change

## 2022-07-10 ENCOUNTER — Inpatient Hospital Stay: Payer: Medicaid Other

## 2022-07-13 ENCOUNTER — Other Ambulatory Visit: Payer: Medicaid Other

## 2022-07-14 ENCOUNTER — Inpatient Hospital Stay: Payer: Medicaid Other | Admitting: Hematology and Oncology

## 2022-07-14 ENCOUNTER — Inpatient Hospital Stay: Payer: Medicaid Other | Attending: Hematology and Oncology

## 2022-07-14 ENCOUNTER — Other Ambulatory Visit: Payer: Self-pay

## 2022-07-14 DIAGNOSIS — N920 Excessive and frequent menstruation with regular cycle: Secondary | ICD-10-CM | POA: Insufficient documentation

## 2022-07-14 DIAGNOSIS — D649 Anemia, unspecified: Secondary | ICD-10-CM

## 2022-07-14 DIAGNOSIS — Z79899 Other long term (current) drug therapy: Secondary | ICD-10-CM | POA: Insufficient documentation

## 2022-07-14 DIAGNOSIS — D500 Iron deficiency anemia secondary to blood loss (chronic): Secondary | ICD-10-CM | POA: Diagnosis present

## 2022-07-14 DIAGNOSIS — D539 Nutritional anemia, unspecified: Secondary | ICD-10-CM

## 2022-07-14 DIAGNOSIS — R0602 Shortness of breath: Secondary | ICD-10-CM | POA: Insufficient documentation

## 2022-07-14 DIAGNOSIS — R42 Dizziness and giddiness: Secondary | ICD-10-CM | POA: Diagnosis not present

## 2022-07-14 DIAGNOSIS — K5900 Constipation, unspecified: Secondary | ICD-10-CM | POA: Insufficient documentation

## 2022-07-14 DIAGNOSIS — E538 Deficiency of other specified B group vitamins: Secondary | ICD-10-CM | POA: Insufficient documentation

## 2022-07-14 LAB — IRON AND IRON BINDING CAPACITY (CC-WL,HP ONLY)
Iron: 69 ug/dL (ref 28–170)
Saturation Ratios: 24 % (ref 10.4–31.8)
TIBC: 294 ug/dL (ref 250–450)
UIBC: 225 ug/dL (ref 148–442)

## 2022-07-14 LAB — CBC WITH DIFFERENTIAL (CANCER CENTER ONLY)
Abs Immature Granulocytes: 0.01 10*3/uL (ref 0.00–0.07)
Basophils Absolute: 0 10*3/uL (ref 0.0–0.1)
Basophils Relative: 1 %
Eosinophils Absolute: 0.1 10*3/uL (ref 0.0–0.5)
Eosinophils Relative: 2 %
HCT: 38 % (ref 36.0–46.0)
Hemoglobin: 12.5 g/dL (ref 12.0–15.0)
Immature Granulocytes: 0 %
Lymphocytes Relative: 29 %
Lymphs Abs: 1.3 10*3/uL (ref 0.7–4.0)
MCH: 25.9 pg — ABNORMAL LOW (ref 26.0–34.0)
MCHC: 32.9 g/dL (ref 30.0–36.0)
MCV: 78.7 fL — ABNORMAL LOW (ref 80.0–100.0)
Monocytes Absolute: 0.4 10*3/uL (ref 0.1–1.0)
Monocytes Relative: 8 %
Neutro Abs: 2.8 10*3/uL (ref 1.7–7.7)
Neutrophils Relative %: 60 %
Platelet Count: 234 10*3/uL (ref 150–400)
RBC: 4.83 MIL/uL (ref 3.87–5.11)
WBC Count: 4.6 10*3/uL (ref 4.0–10.5)
nRBC: 0 % (ref 0.0–0.2)

## 2022-07-14 LAB — SEDIMENTATION RATE: Sed Rate: 13 mm/hr (ref 0–22)

## 2022-07-14 LAB — VITAMIN B12: Vitamin B-12: 270 pg/mL (ref 180–914)

## 2022-07-14 LAB — FERRITIN: Ferritin: 22 ng/mL (ref 11–307)

## 2022-07-16 ENCOUNTER — Inpatient Hospital Stay (HOSPITAL_BASED_OUTPATIENT_CLINIC_OR_DEPARTMENT_OTHER): Payer: Medicaid Other | Admitting: Hematology and Oncology

## 2022-07-16 ENCOUNTER — Encounter: Payer: Self-pay | Admitting: Hematology and Oncology

## 2022-07-16 ENCOUNTER — Telehealth: Payer: Self-pay | Admitting: Nurse Practitioner

## 2022-07-16 DIAGNOSIS — D500 Iron deficiency anemia secondary to blood loss (chronic): Secondary | ICD-10-CM | POA: Diagnosis not present

## 2022-07-16 DIAGNOSIS — E538 Deficiency of other specified B group vitamins: Secondary | ICD-10-CM

## 2022-07-16 DIAGNOSIS — N920 Excessive and frequent menstruation with regular cycle: Secondary | ICD-10-CM | POA: Diagnosis not present

## 2022-07-16 NOTE — Progress Notes (Signed)
HEMATOLOGY-ONCOLOGY ELECTRONIC VISIT PROGRESS NOTE  Patient Care Team: Charyl Dancer, NP as PCP - General (Internal Medicine)  I connected with the patient via telephone conference and verified that I am speaking with the correct person using two identifiers. The patient's location is at home and I am providing care from the [facility] I discussed the limitations, risks, security and privacy concerns of performing an evaluation and management service by e-visits and the availability of in person appointments.  I also discussed with the patient that there may be a patient responsible charge related to this service. The patient expressed understanding and agreed to proceed.   ASSESSMENT & PLAN:  Iron deficiency anemia due to chronic blood loss After significant intravenous iron replacement therapy, she is no longer anemic Iron studies revealed borderline low ferritin I recommend oral iron supplement over-the-counter for the next 3 months  B12 deficiency After 2 doses of parenteral vitamin B12 injection, her B12 level is now satisfactory I recommend over-the-counter oral vitamin B12 replacement therapy  Menorrhagia This has resolved with recent IUD placement I will defer to her gynecologist for management  Orders Placed This Encounter  Procedures   Iron and Iron Binding Capacity ([facility] only)    Standing Status:   Future    Standing Expiration Date:   07/16/2023   Ferritin    Standing Status:   Future    Standing Expiration Date:   07/16/2023   Vitamin B12    Standing Status:   Future    Standing Expiration Date:   07/16/2023   CBC with Differential ([facility] Only)    Standing Status:   Future    Standing Expiration Date:   07/16/2023    INTERVAL HISTORY: Please see below for problem oriented charting. The purpose of today's discussion is to review her blood work results The patient felt great after recent B12 injection and intravenous iron infusion She denies recent  menorrhagia We discussed test results  Summary of hematologic history She was last seen in 2022 I have reviewed her records The patient has significant history of anemia requiring blood transfusion in 2018 The cause of her anemia is related to heavy menorrhagia Approximately a week ago, she had Mirena IUD placed Prior to that, she have heavy menstruation lasting 7 days once a month with passage of large amount of clots She had EGD performed several years ago which show no evidence of upper GI bleed She has been taking oral iron supplement twice a day for some time but tolerated that poorly and recently switched to once a day She usually take it with an empty stomach in the morning She did not tolerate iron well due to constipation When she is iron deficient, she craves for cornstarch She does not follow a specific diet She denies taking NSAID The patient denies any recent signs or symptoms of bleeding such as spontaneous epistaxis, hematuria or hematochezia. She have received multiple doses of intravenous iron Feraheme in the past with improvement She tolerated that well without side effects Currently, with severe anemia, she complained of dizziness, lightheadedness and shortness of breath on exertion In January 2024, she received 2 doses of intravenous iron as well as B12 injections  REVIEW OF SYSTEMS:   Constitutional: Denies fevers, chills or abnormal weight loss Eyes: Denies blurriness of vision Ears, nose, mouth, throat, and face: Denies mucositis or sore throat Respiratory: Denies cough, dyspnea or wheezes Cardiovascular: Denies palpitation, chest discomfort Gastrointestinal:  Denies nausea, heartburn or change in  bowel habits Skin: Denies abnormal skin rashes Lymphatics: Denies new lymphadenopathy or easy bruising Neurological:Denies numbness, tingling or new weaknesses Behavioral/Psych: Mood is stable, no new changes  Extremities: No lower extremity edema All other systems  were reviewed with the patient and are negative.  I have reviewed the past medical history, past surgical history, social history and family history with the patient and they are unchanged from previous note.  ALLERGIES:  has No Known Allergies.  MEDICATIONS:  Current Outpatient Medications  Medication Sig Dispense Refill   acetaminophen (TYLENOL) 500 MG tablet Take 1 tablet (500 mg total) by mouth every 6 (six) hours as needed. 30 tablet 0   betamethasone dipropionate 0.05 % cream Apply topically 2 (two) times daily. 30 g 1   escitalopram (LEXAPRO) 10 MG tablet Take 1 tablet (10 mg total) by mouth daily. 90 tablet 1   ferrous sulfate 325 (65 FE) MG tablet Take 1 tablet (325 mg total) by mouth daily with breakfast. 180 tablet 0   Prenatal Vit-Fe Fumarate-FA (MULTIVITAMIN-PRENATAL) 27-0.8 MG TABS tablet Take 1 tablet by mouth daily at 12 noon.     triamcinolone cream (KENALOG) 0.1 % Apply 1 Application topically 2 (two) times daily. 30 g 0   Current Facility-Administered Medications  Medication Dose Route Frequency Provider Last Rate Last Admin   0.9 %  sodium chloride infusion  500 mL Intravenous Once Nandigam, Venia Minks, MD        PHYSICAL EXAMINATION: ECOG PERFORMANCE STATUS: 0 - Asymptomatic  LABORATORY DATA:  I have reviewed the data as listed    Latest Ref Rng & Units 05/29/2022   12:34 PM 01/01/2022   10:57 AM 12/19/2021    8:47 PM  CMP  Glucose 70 - 99 mg/dL 80  69  123   BUN 6 - 20 mg/dL '10  11  13   '$ Creatinine 0.44 - 1.00 mg/dL 0.75  0.76  0.67   Sodium 135 - 145 mmol/L 137  137  138   Potassium 3.5 - 5.1 mmol/L 3.8  3.5  3.3   Chloride 98 - 111 mmol/L 107  104  109   CO2 22 - 32 mmol/L '23  25  20   '$ Calcium 8.9 - 10.3 mg/dL 8.9  9.3  9.0   Total Protein 6.5 - 8.1 g/dL 7.4   8.0   Total Bilirubin 0.3 - 1.2 mg/dL 0.6   0.6   Alkaline Phos 38 - 126 U/L 60   43   AST 15 - 41 U/L 12   13   ALT 0 - 44 U/L 7   10     Lab Results  Component Value Date   WBC 4.6  07/14/2022   HGB 12.5 07/14/2022   HCT 38.0 07/14/2022   MCV 78.7 (L) 07/14/2022   PLT 234 07/14/2022   NEUTROABS 2.8 07/14/2022     I discussed the assessment and treatment plan with the patient. The patient was provided an opportunity to ask questions and all were answered. The patient agreed with the plan and demonstrated an understanding of the instructions. The patient was advised to call back or seek an in-person evaluation if the symptoms worsen or if the condition fails to improve as anticipated.    I spent 20 minutes for the appointment reviewing test results, discuss management and coordination of care.  Heath Lark, MD 07/16/2022 8:58 AM

## 2022-07-16 NOTE — Assessment & Plan Note (Signed)
After significant intravenous iron replacement therapy, she is no longer anemic Iron studies revealed borderline low ferritin I recommend oral iron supplement over-the-counter for the next 3 months

## 2022-07-16 NOTE — Assessment & Plan Note (Signed)
This has resolved with recent IUD placement I will defer to her gynecologist for management

## 2022-07-16 NOTE — Telephone Encounter (Signed)
Per 3/7 IB reached out to patient to schedule, left voicemail.

## 2022-07-16 NOTE — Assessment & Plan Note (Signed)
After 2 doses of parenteral vitamin B12 injection, her B12 level is now satisfactory I recommend over-the-counter oral vitamin B12 replacement therapy

## 2022-07-30 ENCOUNTER — Ambulatory Visit (INDEPENDENT_AMBULATORY_CARE_PROVIDER_SITE_OTHER): Payer: Medicaid Other | Admitting: Nurse Practitioner

## 2022-07-30 ENCOUNTER — Encounter: Payer: Self-pay | Admitting: Nurse Practitioner

## 2022-07-30 VITALS — BP 120/80 | HR 92 | Temp 97.2°F | Ht 66.0 in | Wt 205.0 lb

## 2022-07-30 DIAGNOSIS — Z0000 Encounter for general adult medical examination without abnormal findings: Secondary | ICD-10-CM

## 2022-07-30 DIAGNOSIS — F32A Depression, unspecified: Secondary | ICD-10-CM

## 2022-07-30 DIAGNOSIS — L301 Dyshidrosis [pompholyx]: Secondary | ICD-10-CM

## 2022-07-30 DIAGNOSIS — F419 Anxiety disorder, unspecified: Secondary | ICD-10-CM | POA: Diagnosis not present

## 2022-07-30 MED ORDER — ESCITALOPRAM OXALATE 10 MG PO TABS: 10.0000 mg | ORAL_TABLET | Freq: Every day | ORAL | 3 refills | Status: DC

## 2022-07-30 NOTE — Progress Notes (Signed)
BP 120/80 (BP Location: Right Arm)   Pulse 92   Temp (!) 97.2 F (36.2 C)   Ht 5\' 6"  (1.676 m)   Wt 205 lb (93 kg)   SpO2 98%   BMI 33.09 kg/m    Subjective:    Patient ID: Katie Powell, female    DOB: [birth year], 32 y.o.   MRN: [id number]  CC: Chief Complaint  Patient presents with   Annual Exam    No concerns    HPI: Katie Powell is a 32 y.o. female presenting on 07/30/2022 for comprehensive medical examination. Current medical complaints include:none  She currently lives with: daughter Menopausal Symptoms: no  Depression and Anxiety Screen done today and results listed below:     07/30/2022    8:14 AM 01/29/2022    8:49 AM 01/13/2022    1:25 PM 01/01/2022   10:29 AM 07/03/2021   11:36 AM  Depression screen PHQ 2/9  Decreased Interest 0 0 1 0 0  Down, Depressed, Hopeless 0 0 0 0 0  PHQ - 2 Score 0 0 1 0 0  Altered sleeping 0 0  3 3  Tired, decreased energy 0 1  3 2   Change in appetite 1 1  2 3   Feeling bad or failure about yourself  0 0  0 0  Trouble concentrating 0 0  1 1  Moving slowly or fidgety/restless 0 0  0 1  Suicidal thoughts 0 0   0  PHQ-9 Score 1 2  9 10   Difficult doing work/chores Not difficult at all Not difficult at all  Somewhat difficult Somewhat difficult      07/30/2022    8:15 AM 01/29/2022    8:49 AM 01/01/2022   10:46 AM 02/02/2020   11:08 AM  GAD 7 : Generalized Anxiety Score  Nervous, Anxious, on Edge 0 0 1 3  Control/stop worrying 0 0 1 3  Worry too much - different things 0 0 1 3  Trouble relaxing 0 0 2 3  Restless 1 1 1 3   Easily annoyed or irritable 1 1 3 3   Afraid - awful might happen 0 0 2 3  Total GAD 7 Score 2 2 11 21   Anxiety Difficulty Not difficult at all Not difficult at all Somewhat difficult     The patient does not have a history of falls. I did not complete a risk assessment for falls. A plan of care for falls was not documented.   Past Medical History:  Past Medical History:  Diagnosis Date   Anemia     Anxiety    Blood transfusion without reported diagnosis    Establishing care with new doctor, encounter for 03/09/2019    Surgical History:  Past Surgical History:  Procedure Laterality Date   EYE SURGERY     FINGER SURGERY      Medications:  Current Outpatient Medications on File Prior to Visit  Medication Sig   acetaminophen (TYLENOL) 500 MG tablet Take 1 tablet (500 mg total) by mouth every 6 (six) hours as needed.   betamethasone dipropionate 0.05 % cream Apply topically 2 (two) times daily.   ferrous sulfate 325 (65 FE) MG tablet Take 1 tablet (325 mg total) by mouth daily with breakfast.   Prenatal Vit-Fe Fumarate-FA (MULTIVITAMIN-PRENATAL) 27-0.8 MG TABS tablet Take 1 tablet by mouth daily at 12 noon.   triamcinolone cream (KENALOG) 0.1 % Apply 1 Application topically 2 (two) times daily. (Patient not taking: Reported on 07/30/2022)   [  DISCONTINUED] esomeprazole (NEXIUM) 40 MG capsule Take 1 capsule (40 mg total) by mouth daily.   Current Facility-Administered Medications on File Prior to Visit  Medication   0.9 %  sodium chloride infusion    Allergies:  No Known Allergies  Social History:  Social History   Socioeconomic History   Marital status: Single    Spouse name: Not on file   Number of children: Not on file   Years of education: Not on file   Highest education level: Not on file  Occupational History   Not on file  Tobacco Use   Smoking status: Every Day    Packs/day: 0.50    Years: 6.00    Additional pack years: 0.00    Total pack years: 3.00    Types: Cigarettes   Smokeless tobacco: Never  Vaping Use   Vaping Use: Never used  Substance and Sexual Activity   Alcohol use: Not Currently   Drug use: No   Sexual activity: Yes    Birth control/protection: Pill  Other Topics Concern   Not on file  Social History Narrative   Not on file   Social Determinants of Health   Financial Resource Strain: Not on file  Food Insecurity: Not on file   Transportation Needs: Not on file  Physical Activity: Not on file  Stress: Not on file  Social Connections: Not on file  Intimate Partner Violence: Not on file   Social History   Tobacco Use  Smoking Status Every Day   Packs/day: 0.50   Years: 6.00   Additional pack years: 0.00   Total pack years: 3.00   Types: Cigarettes  Smokeless Tobacco Never   Social History   Substance and Sexual Activity  Alcohol Use Not Currently    Family History:  Family History  Adopted: Yes    Past medical history, surgical history, medications, allergies, family history and social history reviewed with patient today and changes made to appropriate areas of the chart.   Review of Systems  Constitutional: Negative.   HENT: Negative.    Eyes: Negative.   Respiratory: Negative.    Cardiovascular: Negative.   Gastrointestinal: Negative.   Genitourinary: Negative.   Musculoskeletal: Negative.   Skin: Negative.   Neurological: Negative.   Psychiatric/Behavioral: Negative.     All other ROS negative except what is listed above and in the HPI.      Objective:    BP 120/80 (BP Location: Right Arm)   Pulse 92   Temp (!) 97.2 F (36.2 C)   Ht 5\' 6"  (1.676 m)   Wt 205 lb (93 kg)   SpO2 98%   BMI 33.09 kg/m   Wt Readings from Last 3 Encounters:  07/30/22 205 lb (93 kg)  06/25/22 202 lb (91.6 kg)  05/29/22 203 lb (92.1 kg)    Physical Exam Vitals and nursing note reviewed.  Constitutional:      General: She is not in acute distress.    Appearance: Normal appearance.  HENT:     Head: Normocephalic and atraumatic.     Right Ear: Tympanic membrane, ear canal and external ear normal.     Left Ear: Tympanic membrane, ear canal and external ear normal.  Eyes:     Conjunctiva/sclera: Conjunctivae normal.  Cardiovascular:     Rate and Rhythm: Normal rate and regular rhythm.     Pulses: Normal pulses.     Heart sounds: Normal heart sounds.  Pulmonary:     Effort: Pulmonary effort  is normal.     Breath sounds: Normal breath sounds.  Abdominal:     Palpations: Abdomen is soft.     Tenderness: There is no abdominal tenderness.  Musculoskeletal:        General: Normal range of motion.     Cervical back: Normal range of motion and neck supple.     Right lower leg: No edema.     Left lower leg: No edema.  Lymphadenopathy:     Cervical: No cervical adenopathy.  Skin:    General: Skin is warm and dry.     Comments: Scaly, discolored areas on fingers improving  Neurological:     General: No focal deficit present.     Mental Status: She is alert and oriented to person, place, and time.     Cranial Nerves: No cranial nerve deficit.     Coordination: Coordination normal.     Gait: Gait normal.  Psychiatric:        Mood and Affect: Mood normal.        Behavior: Behavior normal.        Thought Content: Thought content normal.        Judgment: Judgment normal.     Results for orders placed or performed in visit on 07/14/22  Sedimentation rate  Result Value Ref Range   Sed Rate 13 0 - 22 mm/hr  Vitamin B12  Result Value Ref Range   Vitamin B-12 270 180 - 914 pg/mL  CBC with Differential (Cancer Center Only)  Result Value Ref Range   WBC Count 4.6 4.0 - 10.5 K/uL   RBC 4.83 3.87 - 5.11 MIL/uL   Hemoglobin 12.5 12.0 - 15.0 g/dL   HCT 38.0 36.0 - 46.0 %   MCV 78.7 (L) 80.0 - 100.0 fL   MCH 25.9 (L) 26.0 - 34.0 pg   MCHC 32.9 30.0 - 36.0 g/dL   RDW Not Measured 11.5 - 15.5 %   Platelet Count 234 150 - 400 K/uL   nRBC 0.0 0.0 - 0.2 %   Neutrophils Relative % 60 %   Neutro Abs 2.8 1.7 - 7.7 K/uL   Lymphocytes Relative 29 %   Lymphs Abs 1.3 0.7 - 4.0 K/uL   Monocytes Relative 8 %   Monocytes Absolute 0.4 0.1 - 1.0 K/uL   Eosinophils Relative 2 %   Eosinophils Absolute 0.1 0.0 - 0.5 K/uL   Basophils Relative 1 %   Basophils Absolute 0.0 0.0 - 0.1 K/uL   WBC Morphology MORPHOLOGY UNREMARKABLE    Smear Review FEW LARGE PLATELETS    Immature Granulocytes 0 %    Abs Immature Granulocytes 0.01 0.00 - 0.07 K/uL   Ovalocytes PRESENT   Ferritin  Result Value Ref Range   Ferritin 22 11 - 307 ng/mL  Iron and Iron Binding Capacity ([facility] only)  Result Value Ref Range   Iron 69 28 - 170 ug/dL   TIBC 294 250 - 450 ug/dL   Saturation Ratios 24 10.4 - 31.8 %   UIBC 225 148 - 442 ug/dL      Assessment & Plan:   Problem List Items Addressed This Visit       Musculoskeletal and Integument   Dyshidrotic eczema    She is following with dermatology and the areas on her fingers are improving.         Other   Anxiety and depression    Chronic, stable. Continue lexapro 10mg  daily. Refill sent to the pharmacy. Follow-up in 1  year.       Relevant Medications   escitalopram (LEXAPRO) 10 MG tablet   Routine general medical examination at a health care facility - Primary    Health maintenance reviewed and updated. Discussed nutrition, exercise. Recent labs reviewed. Follow-up 1 year.          Follow up plan: Return in about 1 year (around 07/30/2023) for CPE.   LABORATORY TESTING:  - Pap smear: up to date  IMMUNIZATIONS:   - Tdap: Tetanus vaccination status reviewed: last tetanus booster within 10 years. - Influenza: Up to date - Pneumovax: Not applicable - Prevnar: Not applicable - HPV: Up to date - Zostavax vaccine: Not applicable  SCREENING: -Mammogram: Not applicable  - Colonoscopy: Not applicable  - Bone Density: Not applicable  -Hearing Test: Not applicable  -Spirometry: Not applicable   PATIENT COUNSELING:   Advised to take 1 mg of folate supplement per day if capable of pregnancy.   Sexuality: Discussed sexually transmitted diseases, partner selection, use of condoms, avoidance of unintended pregnancy  and contraceptive alternatives.   Advised to avoid cigarette smoking.  I discussed with the patient that most people either abstain from alcohol or drink within safe limits (<=14/week and <=4 drinks/occasion for males,  <=7/weeks and <= 3 drinks/occasion for females) and that the risk for alcohol disorders and other health effects rises proportionally with the number of drinks per week and how often a drinker exceeds daily limits.  Discussed cessation/primary prevention of drug use and availability of treatment for abuse.   Diet: Encouraged to adjust caloric intake to maintain  or achieve ideal body weight, to reduce intake of dietary saturated fat and total fat, to limit sodium intake by avoiding high sodium foods and not adding table salt, and to maintain adequate dietary potassium and calcium preferably from fresh fruits, vegetables, and low-fat dairy products.    stressed the importance of regular exercise  Injury prevention: Discussed safety belts, safety helmets, smoke detector, smoking near bedding or upholstery.   Dental health: Discussed importance of regular tooth brushing, flossing, and dental visits.    NEXT PREVENTATIVE PHYSICAL DUE IN 1 YEAR. Return in about 1 year (around 07/30/2023) for CPE.

## 2022-07-30 NOTE — Patient Instructions (Signed)
It was great to see you!  Keep up the great work!   Let's follow-up in 1 year, sooner if you have concerns.  If a referral was placed today, you will be contacted for an appointment. Please note that routine referrals can sometimes take up to 3-4 weeks to process. Please call our office if you haven't heard anything after this time frame.  Take care,  Normal Recinos, NP  

## 2022-08-02 DIAGNOSIS — Z0000 Encounter for general adult medical examination without abnormal findings: Secondary | ICD-10-CM | POA: Insufficient documentation

## 2022-08-02 NOTE — Assessment & Plan Note (Signed)
Chronic, stable. Continue lexapro 10mg  daily. Refill sent to the pharmacy. Follow-up in 1 year.

## 2022-08-02 NOTE — Assessment & Plan Note (Signed)
Health maintenance reviewed and updated. Discussed nutrition, exercise. Recent labs reviewed. Follow-up 1 year 

## 2022-08-02 NOTE — Assessment & Plan Note (Signed)
She is following with dermatology and the areas on her fingers are improving.

## 2022-08-14 ENCOUNTER — Telehealth: Payer: Self-pay | Admitting: Nurse Practitioner

## 2022-08-14 NOTE — Telephone Encounter (Signed)
Pt called stating having trouble contacting [facility]. They sent in RX for her but pharmacy said it requires prior auth. Pt was advised we cannot obtain the auth for her since not ordered by our office. I called [facility] and was told that Derm office is closed today. Provided pt their # [phone] and to call Monday since they are closed today. Advised her they may have requested auth but it can take days or weeks for approval.

## 2022-10-07 ENCOUNTER — Inpatient Hospital Stay: Payer: Medicaid Other | Attending: Hematology and Oncology

## 2022-10-08 ENCOUNTER — Telehealth: Payer: Self-pay

## 2022-10-08 NOTE — Telephone Encounter (Signed)
Called and reschedule missed lab appt for yesterday to 6/6 at 9 am, she is aware of date/time of appt.

## 2022-10-12 ENCOUNTER — Inpatient Hospital Stay: Payer: Medicaid Other | Attending: Hematology and Oncology

## 2022-10-12 DIAGNOSIS — R42 Dizziness and giddiness: Secondary | ICD-10-CM | POA: Insufficient documentation

## 2022-10-12 DIAGNOSIS — Z79899 Other long term (current) drug therapy: Secondary | ICD-10-CM | POA: Diagnosis not present

## 2022-10-12 DIAGNOSIS — D500 Iron deficiency anemia secondary to blood loss (chronic): Secondary | ICD-10-CM | POA: Diagnosis present

## 2022-10-12 DIAGNOSIS — K5900 Constipation, unspecified: Secondary | ICD-10-CM | POA: Diagnosis not present

## 2022-10-12 DIAGNOSIS — E538 Deficiency of other specified B group vitamins: Secondary | ICD-10-CM

## 2022-10-12 DIAGNOSIS — R0602 Shortness of breath: Secondary | ICD-10-CM | POA: Insufficient documentation

## 2022-10-12 DIAGNOSIS — N920 Excessive and frequent menstruation with regular cycle: Secondary | ICD-10-CM | POA: Insufficient documentation

## 2022-10-12 LAB — CBC WITH DIFFERENTIAL (CANCER CENTER ONLY)
Abs Immature Granulocytes: 0.01 10*3/uL (ref 0.00–0.07)
Basophils Absolute: 0 10*3/uL (ref 0.0–0.1)
Basophils Relative: 1 %
Eosinophils Absolute: 0.1 10*3/uL (ref 0.0–0.5)
Eosinophils Relative: 3 %
HCT: 39.4 % (ref 36.0–46.0)
Hemoglobin: 13.4 g/dL (ref 12.0–15.0)
Immature Granulocytes: 0 %
Lymphocytes Relative: 42 %
Lymphs Abs: 2 10*3/uL (ref 0.7–4.0)
MCH: 30 pg (ref 26.0–34.0)
MCHC: 34 g/dL (ref 30.0–36.0)
MCV: 88.1 fL (ref 80.0–100.0)
Monocytes Absolute: 0.4 10*3/uL (ref 0.1–1.0)
Monocytes Relative: 8 %
Neutro Abs: 2.2 10*3/uL (ref 1.7–7.7)
Neutrophils Relative %: 46 %
Platelet Count: 247 10*3/uL (ref 150–400)
RBC: 4.47 MIL/uL (ref 3.87–5.11)
RDW: 13 % (ref 11.5–15.5)
WBC Count: 4.7 10*3/uL (ref 4.0–10.5)
nRBC: 0 % (ref 0.0–0.2)

## 2022-10-12 LAB — FERRITIN: Ferritin: 16 ng/mL (ref 11–307)

## 2022-10-12 LAB — IRON AND IRON BINDING CAPACITY (CC-WL,HP ONLY)
Iron: 101 ug/dL (ref 28–170)
Saturation Ratios: 31 % (ref 10.4–31.8)
TIBC: 323 ug/dL (ref 250–450)
UIBC: 222 ug/dL (ref 148–442)

## 2022-10-12 LAB — VITAMIN B12: Vitamin B-12: 202 pg/mL (ref 180–914)

## 2022-10-15 ENCOUNTER — Encounter: Payer: Self-pay | Admitting: Hematology and Oncology

## 2022-10-15 ENCOUNTER — Inpatient Hospital Stay (HOSPITAL_BASED_OUTPATIENT_CLINIC_OR_DEPARTMENT_OTHER): Payer: Medicaid Other | Admitting: Hematology and Oncology

## 2022-10-15 DIAGNOSIS — D500 Iron deficiency anemia secondary to blood loss (chronic): Secondary | ICD-10-CM | POA: Diagnosis not present

## 2022-10-15 DIAGNOSIS — N920 Excessive and frequent menstruation with regular cycle: Secondary | ICD-10-CM

## 2022-10-15 NOTE — Assessment & Plan Note (Signed)
Repeat blood count showed resolution of anemia Her iron studies and B12 are borderline low but adequate I recommend the patient to continue taking oral vitamin B12 and iron supplement She does not need long-term follow-up with me I recommend repeat test with her primary care doctor within the next 6 months

## 2022-10-15 NOTE — Progress Notes (Signed)
HEMATOLOGY-ONCOLOGY ELECTRONIC VISIT PROGRESS NOTE  Patient Care Team: Gerre Scull, NP as PCP - General (Internal Medicine)  I connected with the patient via telephone conference and verified that I am speaking with the correct person using two identifiers. The patient's location is at home and I am providing care from the [facility] I discussed the limitations, risks, security and privacy concerns of performing an evaluation and management service by e-visits and the availability of in person appointments.  I also discussed with the patient that there may be a patient responsible charge related to this service. The patient expressed understanding and agreed to proceed.   ASSESSMENT & PLAN:  Iron deficiency anemia due to chronic blood loss Repeat blood count showed resolution of anemia Her iron studies and B12 are borderline low but adequate I recommend the patient to continue taking oral vitamin B12 and iron supplement She does not need long-term follow-up with me I recommend repeat test with her primary care doctor within the next 6 months  Menorrhagia She had IUD placement She denies further menorrhagia  No orders of the defined types were placed in this encounter.   INTERVAL HISTORY: Please see below for problem oriented charting. The purpose of today's discussion is to review test results I could not get hold of her this morning but subsequently was able to get hold of her around 130 She felt great She no longer have menorrhagia She is taking her vitamin B12 supplement We reviewed test results and future follow-up  Summary of hematologic history She was last seen in 2022 I have reviewed her records The patient has significant history of anemia requiring blood transfusion in 2018 The cause of her anemia is related to heavy menorrhagia Approximately a week ago, she had Mirena IUD placed Prior to that, she have heavy menstruation lasting 7 days once a month with  passage of large amount of clots She had EGD performed several years ago which show no evidence of upper GI bleed She has been taking oral iron supplement twice a day for some time but tolerated that poorly and recently switched to once a day She usually take it with an empty stomach in the morning She did not tolerate iron well due to constipation When she is iron deficient, she craves for cornstarch She does not follow a specific diet She denies taking NSAID The patient denies any recent signs or symptoms of bleeding such as spontaneous epistaxis, hematuria or hematochezia. She have received multiple doses of intravenous iron Feraheme in the past with improvement She tolerated that well without side effects Currently, with severe anemia, she complained of dizziness, lightheadedness and shortness of breath on exertion In January 2024, she received 2 doses of intravenous iron as well as B12 injections   REVIEW OF SYSTEMS:   Constitutional: Denies fevers, chills or abnormal weight loss Eyes: Denies blurriness of vision Ears, nose, mouth, throat, and face: Denies mucositis or sore throat Respiratory: Denies cough, dyspnea or wheezes Cardiovascular: Denies palpitation, chest discomfort Gastrointestinal:  Denies nausea, heartburn or change in bowel habits Skin: Denies abnormal skin rashes Lymphatics: Denies new lymphadenopathy or easy bruising Neurological:Denies numbness, tingling or new weaknesses Behavioral/Psych: Mood is stable, no new changes  Extremities: No lower extremity edema All other systems were reviewed with the patient and are negative.  I have reviewed the past medical history, past surgical history, social history and family history with the patient and they are unchanged from previous note.  ALLERGIES:  has No  Known Allergies.  MEDICATIONS:  Current Outpatient Medications  Medication Sig Dispense Refill   cyanocobalamin (VITAMIN B12) 1000 MCG tablet Take 1,000 mcg by  mouth daily.     acetaminophen (TYLENOL) 500 MG tablet Take 1 tablet (500 mg total) by mouth every 6 (six) hours as needed. 30 tablet 0   betamethasone dipropionate 0.05 % cream Apply topically 2 (two) times daily. 30 g 1   escitalopram (LEXAPRO) 10 MG tablet Take 1 tablet (10 mg total) by mouth daily. 90 tablet 3   ferrous sulfate 325 (65 FE) MG tablet Take 1 tablet (325 mg total) by mouth daily with breakfast. 180 tablet 0   Prenatal Vit-Fe Fumarate-FA (MULTIVITAMIN-PRENATAL) 27-0.8 MG TABS tablet Take 1 tablet by mouth daily at 12 noon.     triamcinolone cream (KENALOG) 0.1 % Apply 1 Application topically 2 (two) times daily. (Patient not taking: Reported on 07/30/2022) 30 g 0   Current Facility-Administered Medications  Medication Dose Route Frequency Provider Last Rate Last Admin   0.9 %  sodium chloride infusion  500 mL Intravenous Once Nandigam, Eleonore Chiquito, MD        PHYSICAL EXAMINATION: ECOG PERFORMANCE STATUS: 0 - Asymptomatic  LABORATORY DATA:  I have reviewed the data as listed    Latest Ref Rng & Units 05/29/2022   12:34 PM 01/01/2022   10:57 AM 12/19/2021    8:47 PM  CMP  Glucose 70 - 99 mg/dL 80  69  161   BUN 6 - 20 mg/dL 10  11  13    Creatinine 0.44 - 1.00 mg/dL 0.96  0.45  4.09   Sodium 135 - 145 mmol/L 137  137  138   Potassium 3.5 - 5.1 mmol/L 3.8  3.5  3.3   Chloride 98 - 111 mmol/L 107  104  109   CO2 22 - 32 mmol/L 23  25  20    Calcium 8.9 - 10.3 mg/dL 8.9  9.3  9.0   Total Protein 6.5 - 8.1 g/dL 7.4   8.0   Total Bilirubin 0.3 - 1.2 mg/dL 0.6   0.6   Alkaline Phos 38 - 126 U/L 60   43   AST 15 - 41 U/L 12   13   ALT 0 - 44 U/L 7   10     Lab Results  Component Value Date   WBC 4.7 10/12/2022   HGB 13.4 10/12/2022   HCT 39.4 10/12/2022   MCV 88.1 10/12/2022   PLT 247 10/12/2022   NEUTROABS 2.2 10/12/2022     I discussed the assessment and treatment plan with the patient. The patient was provided an opportunity to ask questions and all were answered.  The patient agreed with the plan and demonstrated an understanding of the instructions. The patient was advised to call back or seek an in-person evaluation if the symptoms worsen or if the condition fails to improve as anticipated.    I spent 20 minutes for the appointment reviewing test results, discuss management and coordination of care.  Artis Delay, MD 10/15/2022 1:45 PM

## 2022-10-15 NOTE — Assessment & Plan Note (Signed)
She had IUD placement She denies further menorrhagia

## 2022-11-03 ENCOUNTER — Other Ambulatory Visit (HOSPITAL_COMMUNITY)
Admission: RE | Admit: 2022-11-03 | Discharge: 2022-11-03 | Disposition: A | Discharge status: Home / Self Care | Payer: Medicaid Other | Source: Ambulatory Visit | Attending: Nurse Practitioner | Admitting: Nurse Practitioner

## 2022-11-03 ENCOUNTER — Ambulatory Visit (INDEPENDENT_AMBULATORY_CARE_PROVIDER_SITE_OTHER): Payer: Medicaid Other | Admitting: Nurse Practitioner

## 2022-11-03 ENCOUNTER — Encounter: Payer: Self-pay | Admitting: Nurse Practitioner

## 2022-11-03 VITALS — BP 120/80 | HR 74 | Temp 98.2°F | Resp 16 | Ht 66.0 in | Wt 209.0 lb

## 2022-11-03 DIAGNOSIS — Z1159 Encounter for screening for other viral diseases: Secondary | ICD-10-CM | POA: Diagnosis present

## 2022-11-03 NOTE — Progress Notes (Signed)
                Established Patient Visit  Patient: Katie Powell   DOB: 09-28-1990   31 y.o. Female  MRN: [id number] Visit Date: 11/03/2022  Subjective:    Chief Complaint  Patient presents with   STD Testing     No symptoms.  For piece of mind.  She just got out of a relationship and wants to make sure she doesn't have anything   HPI Katie Powell request for STI screen. She is not sure about exposure or if her previous partner has symptoms. Hx of STI over 13yrs ago. She denies any symptoms at this time.  Reviewed medical, surgical, and social history today  Medications: Outpatient Medications Prior to Visit  Medication Sig   acetaminophen (TYLENOL) 500 MG tablet Take 1 tablet (500 mg total) by mouth every 6 (six) hours as needed.   betamethasone dipropionate 0.05 % cream Apply topically 2 (two) times daily.   cyanocobalamin (VITAMIN B12) 1000 MCG tablet Take 1,000 mcg by mouth daily.   escitalopram (LEXAPRO) 10 MG tablet Take 1 tablet (10 mg total) by mouth daily.   ferrous sulfate 325 (65 FE) MG tablet Take 1 tablet (325 mg total) by mouth daily with breakfast.   Prenatal Vit-Fe Fumarate-FA (MULTIVITAMIN-PRENATAL) 27-0.8 MG TABS tablet Take 1 tablet by mouth daily at 12 noon.   triamcinolone cream (KENALOG) 0.1 % Apply 1 Application topically 2 (two) times daily. (Patient not taking: Reported on 07/30/2022)   Facility-Administered Medications Prior to Visit  Medication Dose Route Frequency Provider   0.9 %  sodium chloride infusion  500 mL Intravenous Once Nandigam, Eleonore Chiquito, MD   Reviewed past medical and social history.   ROS per HPI above      Objective:  BP 120/80 (BP Location: Left Arm, Patient Position: Sitting, Cuff Size: Large)   Pulse 74   Temp 98.2 F (36.8 C) (Temporal)   Resp 16   Ht 5\' 6"  (1.676 m)   Wt 209 lb (94.8 kg)   LMP  (LMP Unknown) Comment: No periods with IUD just spotting  SpO2 100%   BMI 33.73 kg/m      Physical Exam Vitals and nursing  note reviewed.  Constitutional:      General: She is not in acute distress. Cardiovascular:     Rate and Rhythm: Normal rate.     Pulses: Normal pulses.  Pulmonary:     Effort: Pulmonary effort is normal.  Neurological:     Mental Status: She is alert and oriented to person, place, and time.  Psychiatric:        Mood and Affect: Mood normal.        Behavior: Behavior normal.        Thought Content: Thought content normal.     No results found for any visits on 11/03/22.    Assessment & Plan:    Problem List Items Addressed This Visit   None Visit Diagnoses     Encounter for screening for viral disease    -  Primary   Relevant Orders   HIV Antibody (routine testing w rflx)   RPR   Cervicovaginal ancillary only   Hep B Core Ab W/Reflex   Hepatitis C antibody   HSV 1 and 2 Ab, IgG      Return if symptoms worsen or fail to improve.     Alysia Penna, NP

## 2022-11-03 NOTE — Patient Instructions (Signed)
Go to lab

## 2022-11-04 LAB — HEPATITIS B CORE AB W/REFLEX: Hep B Core Total Ab: NEGATIVE

## 2022-11-04 LAB — HSV 1 AND 2 AB, IGG
HSV 1 Glycoprotein G Ab, IgG: <0.91 index (ref 0.00–0.90)
HSV 2 IgG, Type Spec: 23.2 index — ABNORMAL HIGH (ref 0.00–0.90)

## 2022-11-04 LAB — HIV ANTIBODY (ROUTINE TESTING W REFLEX): HIV 1&2 Ab, 4th Generation: NONREACTIVE

## 2022-11-04 LAB — HEPATITIS C ANTIBODY: Hepatitis C Ab: NONREACTIVE

## 2022-11-04 LAB — RPR: RPR Ser Ql: NONREACTIVE

## 2022-11-06 LAB — CERVICOVAGINAL ANCILLARY ONLY
Chlamydia: NEGATIVE
Comment: NEGATIVE
Comment: NEGATIVE
Comment: NORMAL
Neisseria Gonorrhea: NEGATIVE
Trichomonas: NEGATIVE

## 2022-11-06 NOTE — Progress Notes (Signed)
Stable Follow instructions as discussed during office visit.

## 2022-11-09 ENCOUNTER — Encounter: Payer: Self-pay | Admitting: Nurse Practitioner

## 2022-11-09 ENCOUNTER — Ambulatory Visit (INDEPENDENT_AMBULATORY_CARE_PROVIDER_SITE_OTHER): Payer: Medicaid Other | Admitting: Nurse Practitioner

## 2022-11-09 VITALS — BP 124/84 | HR 72 | Resp 16 | Ht 66.0 in | Wt 210.0 lb

## 2022-11-09 DIAGNOSIS — B009 Herpesviral infection, unspecified: Secondary | ICD-10-CM | POA: Diagnosis not present

## 2022-11-09 NOTE — Progress Notes (Signed)
   Established Patient Office Visit  Subjective   Patient ID: Katie Powell, female    DOB: 16-Feb-1991  Age: 32 y.o. MRN: [id number]  Chief Complaint  Patient presents with   Results    Go over lab results- she seen them on My chart but would like to discuss results     HPI  Katie Powell would like to discuss recent positive HSV 2 test results. She is not having any symptoms. She does not remember being tested for this in the past. She denies any symptoms of prior outbreaks.     ROS See pertinent positives and negatives per HPI.    Objective:     BP 124/84 (BP Location: Left Arm, Cuff Size: Large)   Pulse 72   Resp 16   Ht 5\' 6"  (1.676 m)   Wt 210 lb (95.3 kg)   LMP  (LMP Unknown) Comment: No periods with IUD just spotting  SpO2 98%   BMI 33.89 kg/m    Physical Exam Vitals and nursing note reviewed.  Constitutional:      General: She is not in acute distress.    Appearance: Normal appearance.  HENT:     Head: Normocephalic.  Eyes:     Conjunctiva/sclera: Conjunctivae normal.  Pulmonary:     Effort: Pulmonary effort is normal.  Musculoskeletal:     Cervical back: Normal range of motion.  Skin:    General: Skin is warm.  Neurological:     General: No focal deficit present.     Mental Status: She is alert and oriented to person, place, and time.  Psychiatric:        Mood and Affect: Mood normal.        Behavior: Behavior normal.        Thought Content: Thought content normal.        Judgment: Judgment normal.      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  No current or prior symptoms.  Discussed using condoms with sexual intercourse.  Also discussed symptoms of outbreak and to reach out if she develops any of the symptoms.  Follow-up with any concerns.       Return if symptoms worsen or fail to improve.    Gerre Scull, NP

## 2022-11-09 NOTE — Patient Instructions (Signed)
It was great to see you!  Let me know if you get any burning pain or blisters in your genital area.   Let's follow-up with any concerns or questions.   Take care,  Rodman Pickle, NP

## 2022-11-09 NOTE — Assessment & Plan Note (Signed)
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  No current or prior symptoms.  Discussed using condoms with sexual intercourse.  Also discussed symptoms of outbreak and to reach out if she develops any of the symptoms.  Follow-up with any concerns.

## 2022-11-11 ENCOUNTER — Ambulatory Visit: Payer: Medicaid Other | Admitting: Nurse Practitioner

## 2023-08-02 ENCOUNTER — Encounter: Payer: Medicaid Other | Admitting: Nurse Practitioner

## 2023-08-09 ENCOUNTER — Ambulatory Visit (INDEPENDENT_AMBULATORY_CARE_PROVIDER_SITE_OTHER): Payer: Medicaid Other | Admitting: Nurse Practitioner

## 2023-08-09 ENCOUNTER — Encounter: Payer: Self-pay | Admitting: Nurse Practitioner

## 2023-08-09 VITALS — BP 126/82 | HR 76 | Temp 97.1°F | Ht 66.0 in | Wt 226.0 lb

## 2023-08-09 DIAGNOSIS — E538 Deficiency of other specified B group vitamins: Secondary | ICD-10-CM

## 2023-08-09 DIAGNOSIS — F419 Anxiety disorder, unspecified: Secondary | ICD-10-CM | POA: Diagnosis not present

## 2023-08-09 DIAGNOSIS — D500 Iron deficiency anemia secondary to blood loss (chronic): Secondary | ICD-10-CM | POA: Diagnosis not present

## 2023-08-09 DIAGNOSIS — F32A Depression, unspecified: Secondary | ICD-10-CM

## 2023-08-09 DIAGNOSIS — L301 Dyshidrosis [pompholyx]: Secondary | ICD-10-CM

## 2023-08-09 DIAGNOSIS — Z0000 Encounter for general adult medical examination without abnormal findings: Secondary | ICD-10-CM

## 2023-08-09 DIAGNOSIS — Z720 Tobacco use: Secondary | ICD-10-CM | POA: Diagnosis not present

## 2023-08-09 DIAGNOSIS — J343 Hypertrophy of nasal turbinates: Secondary | ICD-10-CM

## 2023-08-09 MED ORDER — ESCITALOPRAM OXALATE 10 MG PO TABS: 10.0000 mg | ORAL_TABLET | Freq: Every day | ORAL | 3 refills | Status: DC

## 2023-08-09 NOTE — Assessment & Plan Note (Addendum)
 Will check a CBC, vitamin B12 and iron panel today.  She has been taking an iron supplement 3 times a week.

## 2023-08-09 NOTE — Assessment & Plan Note (Signed)
 Chronic, not controlled. Symptoms worsened recently after separating from her partner of 2 years. Will restart lexapro 10mg  daily

## 2023-08-09 NOTE — Assessment & Plan Note (Signed)
 Chronic, improving. Continue collaboration and recommendations from dermatology.

## 2023-08-09 NOTE — Assessment & Plan Note (Signed)
 She currently smokes about half pack a day.  Encourage complete tobacco cessation. Prevnar 20 given today.

## 2023-08-09 NOTE — Patient Instructions (Signed)
 It was great to see you!  We are checking your labs today and will let you know the results via mychart/phone.   Restart your lexapro once a day  I am placing a referral to ENT  Let's follow-up in 6 months, sooner if you have concerns.  If a referral was placed today, you will be contacted for an appointment. Please note that routine referrals can sometimes take up to 3-4 weeks to process. Please call our office if you haven't heard anything after this time frame.  Take care,  Rodman Pickle, NP

## 2023-08-09 NOTE — Assessment & Plan Note (Signed)
She has a history of B12 deficiency, we will check vitamin B12 levels today.

## 2023-08-09 NOTE — Assessment & Plan Note (Signed)
Health maintenance reviewed and updated. Discussed nutrition, exercise. Follow-up 1 year.

## 2023-08-09 NOTE — Progress Notes (Signed)
 BP 126/82 (BP Location: Left Arm, Patient Position: Sitting, Cuff Size: Normal)   Pulse 76   Temp (!) 97.1 F (36.2 C)   Ht 5\' 6"  (1.676 m)   Wt 226 lb (102.5 kg)   SpO2 95%   BMI 36.48 kg/m    Subjective:    Patient ID: Katie Powell, female    DOB: [birth year], 33 y.o.   MRN: [id number]  CC: Chief Complaint  Patient presents with   Annual Exam    No concerns    HPI: [name] is a 33 y.o. female presenting on 08/09/2023 for comprehensive medical examination. Current medical complaints include: trouble sleeping  She feels like the inside of her nose gets swollen and bubbles up at night making it harder to breathe and sleep. She has tried OTC nasal spray, however this has caused her bleeding and also sleeps with a humidifer. She looked up her symptoms online and is concerned for turbinate hypertrophy.   She also notes that she has been having some increased depression and irritability. She just got out of 2 year relationship. She is not currently taking lexapro, but notes it helped in the past. She denies SI/HI.   She currently lives with: daughter Menopausal Symptoms: no  Depression and Anxiety Screen done today and results listed below:     08/09/2023    2:59 PM 07/30/2022    8:14 AM 01/29/2022    8:49 AM 01/13/2022    1:25 PM 01/01/2022   10:29 AM  Depression screen PHQ 2/9  Decreased Interest 1 0 0 1 0  Down, Depressed, Hopeless 1 0 0 0 0  PHQ - 2 Score 2 0 0 1 0  Altered sleeping 1 0 0  3  Tired, decreased energy 1 0 1  3  Change in appetite 1 1 1  2   Feeling bad or failure about yourself  2 0 0  0  Trouble concentrating 1 0 0  1  Moving slowly or fidgety/restless 1 0 0  0  Suicidal thoughts 0 0 0    PHQ-9 Score 9 1 2  9   Difficult doing work/chores Very difficult Not difficult at all Not difficult at all  Somewhat difficult      08/09/2023    2:59 PM 07/30/2022    8:15 AM 01/29/2022    8:49 AM 01/01/2022   10:46 AM  GAD 7 : Generalized Anxiety Score   Nervous, Anxious, on Edge 2 0 0 1  Control/stop worrying 2 0 0 1  Worry too much - different things 2 0 0 1  Trouble relaxing 2 0 0 2  Restless 2 1 1 1   Easily annoyed or irritable 3 1 1 3   Afraid - awful might happen 0 0 0 2  Total GAD 7 Score 13 2 2 11   Anxiety Difficulty Very difficult Not difficult at all Not difficult at all Somewhat difficult    The patient does not have a history of falls. I did not complete a risk assessment for falls. A plan of care for falls was not documented.   Past Medical History:  Past Medical History:  Diagnosis Date   Anemia    Anxiety    Blood transfusion without reported diagnosis    Establishing care with new doctor, encounter for 03/09/2019    Surgical History:  Past Surgical History:  Procedure Laterality Date   EYE SURGERY     FINGER SURGERY      Medications:  Current  Outpatient Medications on File Prior to Visit  Medication Sig   acetaminophen (TYLENOL) 500 MG tablet Take 1 tablet (500 mg total) by mouth every 6 (six) hours as needed.   ferrous sulfate 325 (65 FE) MG tablet Take 1 tablet (325 mg total) by mouth daily with breakfast.   levonorgestrel (MIRENA) 20 MCG/DAY IUD 1 each by Intrauterine route once.   [DISCONTINUED] esomeprazole (NEXIUM) 40 MG capsule Take 1 capsule (40 mg total) by mouth daily.   Current Facility-Administered Medications on File Prior to Visit  Medication   0.9 %  sodium chloride infusion    Allergies:  No Known Allergies  Social History:  Social History   Socioeconomic History   Marital status: Single    Spouse name: Not on file   Number of children: Not on file   Years of education: Not on file   Highest education level: Some college, no degree  Occupational History   Not on file  Tobacco Use   Smoking status: Every Day    Current packs/day: 0.50    Average packs/day: 0.5 packs/day for 6.0 years (3.0 ttl pk-yrs)    Types: Cigarettes   Smokeless tobacco: Never  Vaping Use   Vaping  status: Never Used  Substance and Sexual Activity   Alcohol use: Not Currently   Drug use: No   Sexual activity: Yes    Birth control/protection: Pill  Other Topics Concern   Not on file  Social History Narrative   Not on file   Social Drivers of Health   Financial Resource Strain: Low Risk  (11/06/2022)   Overall Financial Resource Strain (CARDIA)    Difficulty of Paying Living Expenses: Not hard at all  Food Insecurity: No Food Insecurity (11/06/2022)   Hunger Vital Sign    Worried About Running Out of Food in the Last Year: Never true    Ran Out of Food in the Last Year: Never true  Transportation Needs: No Transportation Needs (11/06/2022)   PRAPARE - Administrator, Civil Service (Medical): No    Lack of Transportation (Non-Medical): No  Physical Activity: Unknown (11/06/2022)   Exercise Vital Sign    Days of Exercise per Week: 0 days    Minutes of Exercise per Session: Not on file  Stress: No Stress Concern Present (11/06/2022)   [employer] of Occupational Health - Occupational Stress Questionnaire    Feeling of Stress : Only a little  Social Connections: Socially Isolated (11/06/2022)   Social Connection and Isolation Panel [NHANES]    Frequency of Communication with Friends and Family: Once a week    Frequency of Social Gatherings with Friends and Family: Once a week    Attends Religious Services: Never    Database administrator or Organizations: No    Attends Engineer, structural: Not on file    Marital Status: Never married  Catering manager Violence: Not on file   Social History   Tobacco Use  Smoking Status Every Day   Current packs/day: 0.50   Average packs/day: 0.5 packs/day for 6.0 years (3.0 ttl pk-yrs)   Types: Cigarettes  Smokeless Tobacco Never   Social History   Substance and Sexual Activity  Alcohol Use Not Currently    Family History:  Family History  Adopted: Yes    Past medical history, surgical history,  medications, allergies, family history and social history reviewed with patient today and changes made to appropriate areas of the chart.   Review of Systems  Constitutional: Negative.   HENT:  Positive for congestion.   Eyes: Negative.   Respiratory: Negative.    Cardiovascular: Negative.   Gastrointestinal: Negative.   Genitourinary: Negative.   Musculoskeletal: Negative.   Skin: Negative.   Neurological: Negative.   Psychiatric/Behavioral:  Positive for depression. The patient has insomnia.    All other ROS negative except what is listed above and in the HPI.      Objective:    BP 126/82 (BP Location: Left Arm, Patient Position: Sitting, Cuff Size: Normal)   Pulse 76   Temp (!) 97.1 F (36.2 C)   Ht 5\' 6"  (1.676 m)   Wt 226 lb (102.5 kg)   SpO2 95%   BMI 36.48 kg/m   Wt Readings from Last 3 Encounters:  08/09/23 226 lb (102.5 kg)  11/09/22 210 lb (95.3 kg)  11/03/22 209 lb (94.8 kg)    Physical Exam Vitals and nursing note reviewed.  Constitutional:      General: She is not in acute distress.    Appearance: Normal appearance.  HENT:     Head: Normocephalic and atraumatic.     Right Ear: Tympanic membrane, ear canal and external ear normal.     Left Ear: Tympanic membrane, ear canal and external ear normal.     Nose:     Right Turbinates: Enlarged and swollen.     Left Turbinates: Enlarged and swollen.     Mouth/Throat:     Mouth: Mucous membranes are moist.     Pharynx: No posterior oropharyngeal erythema.  Eyes:     Conjunctiva/sclera: Conjunctivae normal.  Cardiovascular:     Rate and Rhythm: Normal rate and regular rhythm.     Pulses: Normal pulses.     Heart sounds: Normal heart sounds.  Pulmonary:     Effort: Pulmonary effort is normal.     Breath sounds: Normal breath sounds.  Abdominal:     Palpations: Abdomen is soft.     Tenderness: There is no abdominal tenderness.  Musculoskeletal:        General: Normal range of motion.     Cervical back:  Normal range of motion and neck supple.     Right lower leg: No edema.     Left lower leg: No edema.  Lymphadenopathy:     Cervical: No cervical adenopathy.  Skin:    General: Skin is warm and dry.  Neurological:     General: No focal deficit present.     Mental Status: She is alert and oriented to person, place, and time.     Cranial Nerves: No cranial nerve deficit.     Coordination: Coordination normal.     Gait: Gait normal.  Psychiatric:        Mood and Affect: Mood normal.        Behavior: Behavior normal.        Thought Content: Thought content normal.        Judgment: Judgment normal.     Results for orders placed or performed in visit on 11/03/22  Cervicovaginal ancillary only   Collection Time: 11/03/22  9:08 AM  Result Value Ref Range   Neisseria Gonorrhea Negative    Chlamydia Negative    Trichomonas Negative    Comment Normal Reference Range Trichomonas - Negative    Comment Normal Reference Ranger Chlamydia - Negative    Comment      Normal Reference Range Neisseria Gonorrhea - Negative  HIV Antibody (routine testing w rflx)   Collection  Time: 11/03/22  9:19 AM  Result Value Ref Range   HIV 1&2 Ab, 4th Generation NON-REACTIVE NON-REACTIVE  RPR   Collection Time: 11/03/22  9:19 AM  Result Value Ref Range   RPR Ser Ql NON-REACTIVE NON-REACTIVE  Hep B Core Ab W/Reflex   Collection Time: 11/03/22  9:19 AM  Result Value Ref Range   Hep B Core Total Ab Negative Negative  Hepatitis C antibody   Collection Time: 11/03/22  9:19 AM  Result Value Ref Range   Hepatitis C Ab NON-REACTIVE NON-REACTIVE  HSV 1 and 2 Ab, IgG   Collection Time: 11/03/22  9:19 AM  Result Value Ref Range   HSV 1 Glycoprotein G Ab, IgG <0.91 0.00 - 0.90 index   HSV 2 IgG, Type Spec 23.20 (H) 0.00 - 0.90 index      Assessment & Plan:   Problem List Items Addressed This Visit       Musculoskeletal and Integument   Dyshidrotic eczema   Chronic, improving. Continue collaboration and  recommendations from dermatology.         Other   Iron deficiency anemia due to chronic blood loss (Chronic)   Will check a CBC, vitamin B12 and iron panel today.  She has been taking an iron supplement 3 times a week.       Relevant Orders   CBC with Differential/Platelet   Comprehensive metabolic panel with GFR   Vitamin B12   Ferritin   Iron   B12 deficiency   She has a history of B12 deficiency, we will check vitamin B12 levels today.      Anxiety and depression   Chronic, not controlled. Symptoms worsened recently after separating from her partner of 2 years. Will restart lexapro 10mg  daily       Relevant Medications   escitalopram (LEXAPRO) 10 MG tablet   Tobacco use   She currently smokes about half pack a day.  Encourage complete tobacco cessation. Prevnar 20 given today.       Relevant Orders   Pneumococcal conjugate vaccine 20-valent (Completed)   Routine general medical examination at a health care facility - Primary   Health maintenance reviewed and updated. Discussed nutrition, exercise. Follow-up 1 year.        Other Visit Diagnoses       Hypertrophy of nasal turbinates       Causing trouble breathing through her nose and sleeping. Will place referral to ENT.   Relevant Orders   Ambulatory referral to ENT        Follow up plan: Return in about 6 months (around 02/08/2024) for Anxiety, Depression.   LABORATORY TESTING:  - Pap smear:  will schedule with GYN   IMMUNIZATIONS:   - Tdap: Tetanus vaccination status reviewed: last tetanus booster within 10 years. - Influenza: Declined - Pneumovax: Not applicable - Prevnar: Not applicable - HPV: Up to date - Shingrix vaccine: Not applicable  SCREENING: -Mammogram: Not applicable  - Colonoscopy: Not applicable  - Bone Density: Not applicable   PATIENT COUNSELING:   Advised to take 1 mg of folate supplement per day if capable of pregnancy.   Sexuality: Discussed sexually transmitted diseases,  partner selection, use of condoms, avoidance of unintended pregnancy  and contraceptive alternatives.   Advised to avoid cigarette smoking.  I discussed with the patient that most people either abstain from alcohol or drink within safe limits (<=14/week and <=4 drinks/occasion for males, <=7/weeks and <= 3 drinks/occasion for females) and that the risk  for alcohol disorders and other health effects rises proportionally with the number of drinks per week and how often a drinker exceeds daily limits.  Discussed cessation/primary prevention of drug use and availability of treatment for abuse.   Diet: Encouraged to adjust caloric intake to maintain  or achieve ideal body weight, to reduce intake of dietary saturated fat and total fat, to limit sodium intake by avoiding high sodium foods and not adding table salt, and to maintain adequate dietary potassium and calcium preferably from fresh fruits, vegetables, and low-fat dairy products.    stressed the importance of regular exercise  Injury prevention: Discussed safety belts, safety helmets, smoke detector, smoking near bedding or upholstery.   Dental health: Discussed importance of regular tooth brushing, flossing, and dental visits.    NEXT PREVENTATIVE PHYSICAL DUE IN 1 YEAR. Return in about 6 months (around 02/08/2024) for Anxiety, Depression.  Catherine Oak A Arneshia Ade

## 2023-08-10 ENCOUNTER — Encounter: Payer: Self-pay | Admitting: Nurse Practitioner

## 2023-08-10 LAB — COMPREHENSIVE METABOLIC PANEL WITH GFR
ALT: 9 U/L (ref 0–35)
AST: 14 U/L (ref 0–37)
Albumin: 4.2 g/dL (ref 3.5–5.2)
Alkaline Phosphatase: 61 U/L (ref 39–117)
BUN: 13 mg/dL (ref 6–23)
CO2: 24 meq/L (ref 19–32)
Calcium: 9.1 mg/dL (ref 8.4–10.5)
Chloride: 105 meq/L (ref 96–112)
Creatinine, Ser: 0.8 mg/dL (ref 0.40–1.20)
GFR: 98.98 mL/min (ref >60.00)
Glucose, Bld: 73 mg/dL (ref 70–99)
Potassium: 3.9 meq/L (ref 3.5–5.1)
Sodium: 136 meq/L (ref 135–145)
Total Bilirubin: 0.6 mg/dL (ref 0.2–1.2)
Total Protein: 7.7 g/dL (ref 6.0–8.3)

## 2023-08-10 LAB — CBC WITH DIFFERENTIAL/PLATELET
Basophils Absolute: 0 10*3/uL (ref 0.0–0.1)
Basophils Relative: 0.8 % (ref 0.0–3.0)
Eosinophils Absolute: 0.1 10*3/uL (ref 0.0–0.7)
Eosinophils Relative: 0.9 % (ref 0.0–5.0)
HCT: 39.7 % (ref 36.0–46.0)
Hemoglobin: 13.3 g/dL (ref 12.0–15.0)
Lymphocytes Relative: 25 % (ref 12.0–46.0)
Lymphs Abs: 1.6 10*3/uL (ref 0.7–4.0)
MCHC: 33.5 g/dL (ref 30.0–36.0)
MCV: 89.1 fl (ref 78.0–100.0)
Monocytes Absolute: 0.4 10*3/uL (ref 0.1–1.0)
Monocytes Relative: 6.7 % (ref 3.0–12.0)
Neutro Abs: 4.3 10*3/uL (ref 1.4–7.7)
Neutrophils Relative %: 66.6 % (ref 43.0–77.0)
Platelets: 306 10*3/uL (ref 150.0–400.0)
RBC: 4.45 Mil/uL (ref 3.87–5.11)
RDW: 13.6 % (ref 11.5–15.5)
WBC: 6.4 10*3/uL (ref 4.0–10.5)

## 2023-08-10 LAB — FERRITIN: Ferritin: 23.8 ng/mL (ref 10.0–291.0)

## 2023-08-10 LAB — IRON: Iron: 115 ug/dL (ref 42–145)

## 2023-08-10 LAB — VITAMIN B12: Vitamin B-12: 209 pg/mL — ABNORMAL LOW (ref 211–911)

## 2023-08-13 ENCOUNTER — Encounter (INDEPENDENT_AMBULATORY_CARE_PROVIDER_SITE_OTHER): Payer: Self-pay | Admitting: Otolaryngology

## 2023-08-20 ENCOUNTER — Encounter (INDEPENDENT_AMBULATORY_CARE_PROVIDER_SITE_OTHER): Payer: Self-pay | Admitting: Otolaryngology

## 2023-09-16 ENCOUNTER — Telehealth: Payer: Self-pay

## 2023-09-16 NOTE — Telephone Encounter (Signed)
 TC to pt about bleeding with IUD, advised for pt to come in for appointment. Pt reports going through 2 pads a day, has had bleeding last for 3 days. No other associated symptoms. Pt to come in 5/14 for IUD check and to discuss breakthrough bleeding

## 2023-09-22 ENCOUNTER — Ambulatory Visit: Admitting: Obstetrics and Gynecology

## 2023-09-22 ENCOUNTER — Encounter: Payer: Self-pay | Admitting: Obstetrics and Gynecology

## 2023-09-22 VITALS — BP 127/88 | HR 76 | Ht 66.0 in | Wt 229.0 lb

## 2023-09-22 DIAGNOSIS — Z30431 Encounter for routine checking of intrauterine contraceptive device: Secondary | ICD-10-CM

## 2023-09-22 DIAGNOSIS — N921 Excessive and frequent menstruation with irregular cycle: Secondary | ICD-10-CM | POA: Diagnosis not present

## 2023-09-22 NOTE — Progress Notes (Signed)
 33 y.o. GYN presents for breakthrough bleeding, blood clots with Mirena  IUD x 1 week.

## 2023-09-22 NOTE — Progress Notes (Signed)
  CC: bleeding with IUD Subjective:    Patient ID: Katie Powell, female    DOB: 1990-06-12, 33 y.o.   MRN: [id number]  HPI Pt seen today with complaint of 7 days of heavier bleeding with clots.  Pt passed clots this AM, but bleeding has decreased.  Previous few cycles have been 1-2 days of spotting and fairly light.     Review of Systems     Objective:    Physical Exam Vitals:   09/22/23 1541  BP: 127/88  Pulse: 76  SSE: IUD strings easily seen, no active bleeding. Light blood tinged mucus noted.       Assessment & Plan:   1. Breakthrough bleeding associated with intrauterine device (IUD) (Primary) Will pursue expectant management for now. If there is any further heavy bleeding can try progesterone supplementation, NSAIDs or lysteda as well as considering a pelvic ultrasound.  F/u in 6 months for annual exam and pap smear.      Abigail Abler, MD Faculty Attending, [facility]

## 2023-11-21 ENCOUNTER — Telehealth: Admitting: Family Medicine

## 2023-11-21 NOTE — Progress Notes (Signed)
 Pt did not show for visit [facility]

## 2024-02-08 ENCOUNTER — Encounter: Payer: Self-pay | Admitting: Nurse Practitioner

## 2024-02-08 ENCOUNTER — Ambulatory Visit (INDEPENDENT_AMBULATORY_CARE_PROVIDER_SITE_OTHER): Admitting: Nurse Practitioner

## 2024-02-08 VITALS — BP 122/88 | HR 75 | Temp 97.0°F | Ht 66.0 in | Wt 238.2 lb

## 2024-02-08 DIAGNOSIS — F419 Anxiety disorder, unspecified: Secondary | ICD-10-CM

## 2024-02-08 DIAGNOSIS — D500 Iron deficiency anemia secondary to blood loss (chronic): Secondary | ICD-10-CM | POA: Diagnosis not present

## 2024-02-08 DIAGNOSIS — E538 Deficiency of other specified B group vitamins: Secondary | ICD-10-CM

## 2024-02-08 DIAGNOSIS — Z23 Encounter for immunization: Secondary | ICD-10-CM

## 2024-02-08 DIAGNOSIS — F32A Depression, unspecified: Secondary | ICD-10-CM | POA: Diagnosis not present

## 2024-02-08 MED ORDER — ESCITALOPRAM OXALATE 10 MG PO TABS: 10.0000 mg | ORAL_TABLET | Freq: Every day | ORAL | 3 refills | Status: AC

## 2024-02-08 NOTE — Patient Instructions (Signed)
It was great to see you!  Keep up the great work!   Let's follow-up in 6 months, sooner if you have concerns.  If a referral was placed today, you will be contacted for an appointment. Please note that routine referrals can sometimes take up to 3-4 weeks to process. Please call our office if you haven't heard anything after this time frame.  Take care,  Vendela Troung, NP  

## 2024-02-08 NOTE — Progress Notes (Signed)
 Established Patient Office Visit  Subjective   Patient ID: Katie Powell, female    DOB: 1990-11-26  Age: 33 y.o. MRN: [id number]  Chief Complaint  Patient presents with   Anxiety and Depression    Follow up, no concerns, Flu Vaccine    HPI  Discussed the use of AI scribe software for clinical note transcription with the patient, who gave verbal consent to proceed.  History of Present Illness   Katie Powell is a 33 year old female with depression and anxiety who presents for a follow-up visit.  Her depression and anxiety have improved significantly with continued use of Lexapro  at night, which aids her sleep. She also takes over-the-counter B12 supplements, which have increased her energy levels. Her mood and energy have improved after ending a relationship that contributed to her depression. She has been single for two to three months and feels like herself again. She is not taking iron  supplements as she has not menstruated in months due to an IUD and believes her iron  levels are adequate. No current health concerns are reported.        02/08/2024   10:32 AM 08/09/2023    2:59 PM 07/30/2022    8:14 AM 01/29/2022    8:49 AM 01/13/2022    1:25 PM  Depression screen PHQ 2/9  Decreased Interest 0 1 0 0 1  Down, Depressed, Hopeless 0 1 0 0 0  PHQ - 2 Score 0 2 0 0 1  Altered sleeping 0 1 0 0   Tired, decreased energy 0 1 0 1   Change in appetite 0 1 1 1    Feeling bad or failure about yourself  0 2 0 0   Trouble concentrating 0 1 0 0   Moving slowly or fidgety/restless 0 1 0 0   Suicidal thoughts 0 0 0 0   PHQ-9 Score 0 9 1 2    Difficult doing work/chores Not difficult at all Very difficult Not difficult at all Not difficult at all       02/08/2024   10:32 AM 08/09/2023    2:59 PM 07/30/2022    8:15 AM 01/29/2022    8:49 AM  GAD 7 : Generalized Anxiety Score  Nervous, Anxious, on Edge 0 2 0 0  Control/stop worrying 0 2 0 0  Worry too much - different things 0 2 0 0   Trouble relaxing 0 2 0 0  Restless 0 2 1 1   Easily annoyed or irritable 0 3 1 1   Afraid - awful might happen 0 0 0 0  Total GAD 7 Score 0 13 2 2   Anxiety Difficulty Not difficult at all Very difficult Not difficult at all Not difficult at all      ROS See pertinent positives and negatives per HPI.    Objective:     BP 122/88 (BP Location: Left Arm, Patient Position: Sitting, Cuff Size: Normal)   Pulse 75   Temp (!) 97 F (36.1 C)   Ht 5' 6 (1.676 m)   Wt 238 lb 3.2 oz (108 kg)   SpO2 98%   BMI 38.45 kg/m  BP Readings from Last 3 Encounters:  02/08/24 122/88  09/22/23 127/88  08/09/23 126/82   Wt Readings from Last 3 Encounters:  02/08/24 238 lb 3.2 oz (108 kg)  09/22/23 229 lb (103.9 kg)  08/09/23 226 lb (102.5 kg)      Physical Exam Vitals and nursing note reviewed.  Constitutional:  General: She is not in acute distress.    Appearance: Normal appearance.  HENT:     Head: Normocephalic.  Eyes:     Conjunctiva/sclera: Conjunctivae normal.  Cardiovascular:     Rate and Rhythm: Normal rate and regular rhythm.     Pulses: Normal pulses.     Heart sounds: Normal heart sounds.  Pulmonary:     Effort: Pulmonary effort is normal.     Breath sounds: Normal breath sounds.  Musculoskeletal:     Cervical back: Normal range of motion.  Skin:    General: Skin is warm.  Neurological:     General: No focal deficit present.     Mental Status: She is alert and oriented to person, place, and time.  Psychiatric:        Mood and Affect: Mood normal.        Behavior: Behavior normal.        Thought Content: Thought content normal.        Judgment: Judgment normal.      Assessment & Plan:   Problem List Items Addressed This Visit       Other   Iron  deficiency anemia due to chronic blood loss (Chronic)   The IUD is effective, resulting in amenorrhea and eliminating the need for iron  supplementation. Continue the current IUD for contraception.        Relevant Medications   Cyanocobalamin  (B-12 PO)   B12 deficiency   Energy levels have improved with B12 supplementation. Continue over-the-counter B12 supplements.      Anxiety and depression - Primary   Chronic, stable. Depression and anxiety are well-managed with Lexapro , with significant improvement in mood and energy. PHQ9 and GAD7 are both 0. Continue Lexapro  10mg  daily at night.      Relevant Medications   escitalopram  (LEXAPRO ) 10 MG tablet   Other Visit Diagnoses       Immunization due       Relevant Orders   Flu vaccine trivalent PF, 6mos and older(Flulaval,Afluria,Fluarix,Fluzone) (Completed)       Return in about 6 months (around 08/07/2024) for CPE.    Tinnie DELENA Harada, NP

## 2024-02-08 NOTE — Assessment & Plan Note (Signed)
 Energy levels have improved with B12 supplementation. Continue over-the-counter B12 supplements.

## 2024-02-08 NOTE — Assessment & Plan Note (Signed)
 Chronic, stable. Depression and anxiety are well-managed with Lexapro , with significant improvement in mood and energy. PHQ9 and GAD7 are both 0. Continue Lexapro  10mg  daily at night.

## 2024-02-08 NOTE — Assessment & Plan Note (Signed)
 The IUD is effective, resulting in amenorrhea and eliminating the need for iron  supplementation. Continue the current IUD for contraception.

## 2024-02-26 ENCOUNTER — Emergency Department (HOSPITAL_COMMUNITY)

## 2024-02-26 ENCOUNTER — Emergency Department (HOSPITAL_COMMUNITY)
Admission: EM | Admit: 2024-02-26 | Discharge: 2024-02-26 | Disposition: A | Discharge status: Home / Self Care | Attending: Emergency Medicine | Admitting: Emergency Medicine

## 2024-02-26 DIAGNOSIS — R059 Cough, unspecified: Secondary | ICD-10-CM | POA: Diagnosis present

## 2024-02-26 DIAGNOSIS — J069 Acute upper respiratory infection, unspecified: Secondary | ICD-10-CM | POA: Insufficient documentation

## 2024-02-26 LAB — RESP PANEL BY RT-PCR (RSV, FLU A&B, COVID)  RVPGX2
Influenza A by PCR: NEGATIVE
Influenza B by PCR: NEGATIVE
Resp Syncytial Virus by PCR: NEGATIVE
SARS Coronavirus 2 by RT PCR: NEGATIVE

## 2024-02-26 NOTE — ED Triage Notes (Signed)
 Patient in today reporting ongoing cough reports daughter is here with same symptoms.

## 2024-02-26 NOTE — Discharge Instructions (Signed)
 You were seen in the emergency department today for concerns of a cough.  You are negative for COVID-19, influenza, and RSV.  Your chest x-ray is also normal.  I do suspect you may have RSV as your daughter tested positive for this.  Continue to manage your symptoms at home with over-the-counter medications such as Tylenol , ibuprofen , Mucinex.  Follow-up with your primary care provider for further evaluation.  For any concerns of new or worsening symptoms, return to the emergency department.

## 2024-02-26 NOTE — ED Provider Notes (Signed)
 [place] EMERGENCY DEPARTMENT AT [facility] Provider Note   CSN: [id number] Arrival date & time: 02/26/24  1240     Patient presents with: Cough   Katie Powell is a 33 y.o. female. Patient without significant medical history presents to the ED with concerns of a cough. States that she has been having a productive cough ongoing now for about 2 weeks. States daughter has similar symptoms. Denies feelings of chest pain or shortness of breath. Currently managing at home with    Cough      Prior to Admission medications   Medication Sig Start Date End Date Taking? Authorizing Provider  acetaminophen  (TYLENOL ) 500 MG tablet Take 1 tablet (500 mg total) by mouth every 6 (six) hours as needed. 04/25/20   Nivia Colon, PA-C  Cyanocobalamin  (B-12 PO) Take by mouth daily.    [provider]  escitalopram  (LEXAPRO ) 10 MG tablet Take 1 tablet (10 mg total) by mouth daily. 02/08/24   McElwee, Tinnie LABOR, NP  ferrous sulfate  325 (65 FE) MG tablet Take 1 tablet (325 mg total) by mouth daily with breakfast. Patient not taking: Reported on 02/08/2024 05/29/22   Lonn Hicks, MD  levonorgestrel  (MIRENA ) 20 MCG/DAY IUD 1 each by Intrauterine route once.    [provider]  esomeprazole  (NEXIUM ) 40 MG capsule Take 1 capsule (40 mg total) by mouth daily. 01/09/20 01/29/20  Kip Ade, NP    Allergies: Patient has no known allergies.    Review of Systems  Respiratory:  Positive for cough.   All other systems reviewed and are negative.   Updated Vital Signs BP (!) 136/102   Pulse 66   Temp 98.1 F (36.7 C) (Oral)   Resp 16   SpO2 100%   Physical Exam Vitals and nursing note reviewed.  Constitutional:      General: She is not in acute distress.    Appearance: She is well-developed.  HENT:     Head: Normocephalic and atraumatic.  Eyes:     Conjunctiva/sclera: Conjunctivae normal.  Cardiovascular:     Rate and Rhythm: Normal rate and regular rhythm.      Heart sounds: No murmur heard. Pulmonary:     Effort: Pulmonary effort is normal. No respiratory distress.     Breath sounds: Normal breath sounds.  Abdominal:     Palpations: Abdomen is soft.     Tenderness: There is no abdominal tenderness.  Musculoskeletal:        General: No swelling.     Cervical back: Neck supple.  Skin:    General: Skin is warm and dry.     Capillary Refill: Capillary refill takes less than 2 seconds.  Neurological:     Mental Status: She is alert.  Psychiatric:        Mood and Affect: Mood normal.     (all labs ordered are listed, but only abnormal results are displayed) Labs Reviewed  RESP PANEL BY RT-PCR (RSV, FLU A&B, COVID)  [id number]    EKG: None  Radiology: DG Chest 2 View Result Date: 02/26/2024 CLINICAL DATA:  Cough. EXAM: CHEST - 2 VIEW COMPARISON:  Chest radiograph dated 01/01/2020. FINDINGS: The heart size and mediastinal contours are within normal limits. Both lungs are clear. The visualized skeletal structures are unremarkable. IMPRESSION: No active cardiopulmonary disease. Electronically Signed   By: Vanetta Chou M.D.   On: 02/26/2024 14:53     Procedures   Medications Ordered in the ED - No data to display  Medical Decision Making Amount and/or Complexity of Data Reviewed Radiology: ordered.   This patient presents to the ED for concern of cough.  Differential diagnosis includes COVID-19, viral URI, pneumonia, bronchitis, postviral cough syndrome   Lab Tests:  I Ordered, and personally interpreted labs.  The pertinent results include: Respiratory panel negative   Imaging Studies ordered:  I ordered imaging studies including chest x-ray I independently visualized and interpreted imaging which showed negative for any acute findings I agree with the radiologist interpretation   Problem List / ED Course:  Patient presents to the emergency department today with concerns of cough ongoing  for the last 2 weeks.  Reports daughter sick with similar symptoms.  Denies any fevers but states cough is remaining somewhat productive.  No reported chest pain or feelings of shortness of breath. On exam, patient has no abnormal heart or lung sounds.  She appears to be breathing comfortably with no obvious signs of respiratory distress. Respiratory panel negative for COVID-19, influenza, RSV.  Chest x-ray negative. Although patient has a negative panel, I do suspect she likely has RSV as her daughter has tested positive for this infection.  Advise continue symptomatic management at home.  Encouraged over-the-counter cough medication use such as Mucinex.  Return precautions advised such as evidence of respiratory distress or feelings of inability to breathe without significant difficulty.  She is otherwise stable for outpatient follow-up and discharged home.   Social Determinants of Health:  None  Final diagnoses:  Viral URI with cough    ED Discharge Orders     None          Iliya Spivack A, PA-C 02/26/24 1656    Patsey Lot, MD 02/27/24 [phone]

## 2024-08-10 ENCOUNTER — Encounter: Admitting: Nurse Practitioner
